# Patient Record
Sex: Male | Born: 1945 | Race: White | Hispanic: No | Marital: Married | Smoking: Current every day smoker
Health system: Southern US, Community
[De-identification: ages and names within clinical notes are randomized; demographics above are authoritative.]

## PROBLEM LIST (undated history)

## (undated) DIAGNOSIS — Z85828 Personal history of other malignant neoplasm of skin: Secondary | ICD-10-CM

## (undated) DIAGNOSIS — E785 Hyperlipidemia, unspecified: Secondary | ICD-10-CM

## (undated) DIAGNOSIS — Z789 Other specified health status: Secondary | ICD-10-CM

## (undated) DIAGNOSIS — I10 Essential (primary) hypertension: Secondary | ICD-10-CM

## (undated) DIAGNOSIS — E559 Vitamin D deficiency, unspecified: Secondary | ICD-10-CM

## (undated) DIAGNOSIS — Z8619 Personal history of other infectious and parasitic diseases: Secondary | ICD-10-CM

## (undated) DIAGNOSIS — E119 Type 2 diabetes mellitus without complications: Secondary | ICD-10-CM

## (undated) DIAGNOSIS — K219 Gastro-esophageal reflux disease without esophagitis: Secondary | ICD-10-CM

## (undated) DIAGNOSIS — I2510 Atherosclerotic heart disease of native coronary artery without angina pectoris: Secondary | ICD-10-CM

## (undated) HISTORY — DX: Personal history of other infectious and parasitic diseases: Z86.19

## (undated) HISTORY — DX: Essential (primary) hypertension: I10

## (undated) HISTORY — PX: HERNIA REPAIR: SHX51

## (undated) HISTORY — DX: Hyperlipidemia, unspecified: E78.5

## (undated) HISTORY — DX: Vitamin D deficiency, unspecified: E55.9

## (undated) HISTORY — PX: TONSILLECTOMY AND ADENOIDECTOMY: SUR1326

## (undated) HISTORY — DX: Gastro-esophageal reflux disease without esophagitis: K21.9

## (undated) HISTORY — DX: Other specified health status: Z78.9

## (undated) HISTORY — DX: Personal history of other malignant neoplasm of skin: Z85.828

## (undated) HISTORY — PX: UMBILICAL HERNIA REPAIR: SHX196

## (undated) HISTORY — PX: CORONARY ANGIOPLASTY WITH STENT PLACEMENT: SHX49

---

## 2003-10-14 ENCOUNTER — Ambulatory Visit (HOSPITAL_COMMUNITY): Admission: RE | Admit: 2003-10-14 | Discharge: 2003-10-14 | Payer: Self-pay | Admitting: Family Medicine

## 2003-10-29 ENCOUNTER — Ambulatory Visit (HOSPITAL_COMMUNITY): Admission: RE | Admit: 2003-10-29 | Discharge: 2003-10-29 | Payer: Self-pay | Admitting: Family Medicine

## 2004-07-05 ENCOUNTER — Ambulatory Visit: Payer: Self-pay | Admitting: Family Medicine

## 2004-09-03 ENCOUNTER — Ambulatory Visit: Payer: Self-pay | Admitting: Family Medicine

## 2004-11-11 ENCOUNTER — Ambulatory Visit: Payer: Self-pay | Admitting: Family Medicine

## 2005-01-24 ENCOUNTER — Ambulatory Visit: Payer: Self-pay | Admitting: Family Medicine

## 2005-04-19 ENCOUNTER — Ambulatory Visit: Payer: Self-pay | Admitting: Family Medicine

## 2005-07-14 ENCOUNTER — Ambulatory Visit: Payer: Self-pay | Admitting: Family Medicine

## 2005-07-20 ENCOUNTER — Ambulatory Visit (HOSPITAL_COMMUNITY): Admission: RE | Admit: 2005-07-20 | Discharge: 2005-07-20 | Payer: Self-pay | Admitting: Family Medicine

## 2005-10-27 ENCOUNTER — Ambulatory Visit: Payer: Self-pay | Admitting: Family Medicine

## 2005-11-14 ENCOUNTER — Ambulatory Visit (HOSPITAL_COMMUNITY): Admission: RE | Admit: 2005-11-14 | Discharge: 2005-11-14 | Payer: Self-pay | Admitting: Family Medicine

## 2005-11-24 ENCOUNTER — Ambulatory Visit: Payer: Self-pay | Admitting: Family Medicine

## 2006-04-27 ENCOUNTER — Ambulatory Visit: Payer: Self-pay | Admitting: Family Medicine

## 2006-07-19 ENCOUNTER — Ambulatory Visit: Payer: Self-pay | Admitting: Family Medicine

## 2006-12-27 ENCOUNTER — Ambulatory Visit: Payer: Self-pay | Admitting: Family Medicine

## 2007-07-31 ENCOUNTER — Ambulatory Visit (HOSPITAL_COMMUNITY): Admission: RE | Admit: 2007-07-31 | Discharge: 2007-07-31 | Payer: Self-pay | Admitting: Family Medicine

## 2010-09-05 ENCOUNTER — Encounter: Payer: Self-pay | Admitting: Family Medicine

## 2013-09-27 ENCOUNTER — Encounter (HOSPITAL_COMMUNITY): Payer: Self-pay | Admitting: Emergency Medicine

## 2013-09-27 ENCOUNTER — Inpatient Hospital Stay (HOSPITAL_COMMUNITY)
Admission: EM | Admit: 2013-09-27 | Discharge: 2013-10-06 | DRG: 963 | Disposition: A | Discharge status: Home Health | Payer: Medicare Other | Attending: General Surgery | Admitting: General Surgery

## 2013-09-27 ENCOUNTER — Emergency Department (HOSPITAL_COMMUNITY): Payer: Medicare Other

## 2013-09-27 DIAGNOSIS — S2249XA Multiple fractures of ribs, unspecified side, initial encounter for closed fracture: Secondary | ICD-10-CM | POA: Diagnosis present

## 2013-09-27 DIAGNOSIS — S2242XA Multiple fractures of ribs, left side, initial encounter for closed fracture: Secondary | ICD-10-CM

## 2013-09-27 DIAGNOSIS — Z79899 Other long term (current) drug therapy: Secondary | ICD-10-CM

## 2013-09-27 DIAGNOSIS — R69 Illness, unspecified: Secondary | ICD-10-CM | POA: Diagnosis present

## 2013-09-27 DIAGNOSIS — S2691XA Contusion of heart, unspecified with or without hemopericardium, initial encounter: Secondary | ICD-10-CM | POA: Diagnosis present

## 2013-09-27 DIAGNOSIS — W19XXXA Unspecified fall, initial encounter: Secondary | ICD-10-CM | POA: Diagnosis present

## 2013-09-27 DIAGNOSIS — N179 Acute kidney failure, unspecified: Secondary | ICD-10-CM | POA: Diagnosis not present

## 2013-09-27 DIAGNOSIS — J9600 Acute respiratory failure, unspecified whether with hypoxia or hypercapnia: Secondary | ICD-10-CM | POA: Diagnosis not present

## 2013-09-27 DIAGNOSIS — S0101XA Laceration without foreign body of scalp, initial encounter: Secondary | ICD-10-CM | POA: Diagnosis present

## 2013-09-27 DIAGNOSIS — Y92009 Unspecified place in unspecified non-institutional (private) residence as the place of occurrence of the external cause: Secondary | ICD-10-CM

## 2013-09-27 DIAGNOSIS — R05 Cough: Secondary | ICD-10-CM | POA: Diagnosis not present

## 2013-09-27 DIAGNOSIS — E876 Hypokalemia: Secondary | ICD-10-CM | POA: Diagnosis not present

## 2013-09-27 DIAGNOSIS — I2510 Atherosclerotic heart disease of native coronary artery without angina pectoris: Secondary | ICD-10-CM | POA: Diagnosis present

## 2013-09-27 DIAGNOSIS — J189 Pneumonia, unspecified organism: Secondary | ICD-10-CM | POA: Diagnosis present

## 2013-09-27 DIAGNOSIS — J9819 Other pulmonary collapse: Secondary | ICD-10-CM | POA: Diagnosis present

## 2013-09-27 DIAGNOSIS — S22079A Unspecified fracture of T9-T10 vertebra, initial encounter for closed fracture: Secondary | ICD-10-CM | POA: Diagnosis present

## 2013-09-27 DIAGNOSIS — S22009A Unspecified fracture of unspecified thoracic vertebra, initial encounter for closed fracture: Secondary | ICD-10-CM | POA: Diagnosis present

## 2013-09-27 DIAGNOSIS — T07XXXA Unspecified multiple injuries, initial encounter: Secondary | ICD-10-CM | POA: Diagnosis present

## 2013-09-27 DIAGNOSIS — R404 Transient alteration of awareness: Secondary | ICD-10-CM | POA: Diagnosis not present

## 2013-09-27 DIAGNOSIS — D62 Acute posthemorrhagic anemia: Secondary | ICD-10-CM | POA: Diagnosis not present

## 2013-09-27 DIAGNOSIS — S0100XA Unspecified open wound of scalp, initial encounter: Secondary | ICD-10-CM | POA: Diagnosis present

## 2013-09-27 DIAGNOSIS — J90 Pleural effusion, not elsewhere classified: Secondary | ICD-10-CM | POA: Diagnosis present

## 2013-09-27 DIAGNOSIS — M5380 Other specified dorsopathies, site unspecified: Secondary | ICD-10-CM | POA: Diagnosis present

## 2013-09-27 DIAGNOSIS — F17200 Nicotine dependence, unspecified, uncomplicated: Secondary | ICD-10-CM | POA: Diagnosis present

## 2013-09-27 DIAGNOSIS — R0989 Other specified symptoms and signs involving the circulatory and respiratory systems: Secondary | ICD-10-CM | POA: Diagnosis present

## 2013-09-27 DIAGNOSIS — W108XXA Fall (on) (from) other stairs and steps, initial encounter: Secondary | ICD-10-CM | POA: Diagnosis present

## 2013-09-27 DIAGNOSIS — I514 Myocarditis, unspecified: Secondary | ICD-10-CM | POA: Diagnosis present

## 2013-09-27 DIAGNOSIS — S32009A Unspecified fracture of unspecified lumbar vertebra, initial encounter for closed fracture: Secondary | ICD-10-CM | POA: Diagnosis present

## 2013-09-27 DIAGNOSIS — Z9861 Coronary angioplasty status: Secondary | ICD-10-CM

## 2013-09-27 DIAGNOSIS — R0609 Other forms of dyspnea: Secondary | ICD-10-CM | POA: Diagnosis present

## 2013-09-27 DIAGNOSIS — F1010 Alcohol abuse, uncomplicated: Secondary | ICD-10-CM | POA: Diagnosis present

## 2013-09-27 DIAGNOSIS — J9383 Other pneumothorax: Secondary | ICD-10-CM | POA: Diagnosis present

## 2013-09-27 DIAGNOSIS — R000 Tachycardia, unspecified: Secondary | ICD-10-CM | POA: Diagnosis not present

## 2013-09-27 DIAGNOSIS — I10 Essential (primary) hypertension: Secondary | ICD-10-CM | POA: Diagnosis present

## 2013-09-27 DIAGNOSIS — IMO0002 Reserved for concepts with insufficient information to code with codable children: Secondary | ICD-10-CM | POA: Diagnosis present

## 2013-09-27 DIAGNOSIS — R059 Cough, unspecified: Secondary | ICD-10-CM | POA: Diagnosis not present

## 2013-09-27 DIAGNOSIS — A419 Sepsis, unspecified organism: Secondary | ICD-10-CM | POA: Diagnosis not present

## 2013-09-27 DIAGNOSIS — S3600XA Unspecified injury of spleen, initial encounter: Secondary | ICD-10-CM | POA: Diagnosis present

## 2013-09-27 HISTORY — DX: Atherosclerotic heart disease of native coronary artery without angina pectoris: I25.10

## 2013-09-27 LAB — POCT I-STAT, CHEM 8
BUN: 12 mg/dL (ref 6–23)
Calcium, Ion: 0.98 mmol/L — ABNORMAL LOW (ref 1.13–1.30)
Chloride: 103 mEq/L (ref 96–112)
Creatinine, Ser: 1.2 mg/dL (ref 0.50–1.35)
Glucose, Bld: 161 mg/dL — ABNORMAL HIGH (ref 70–99)
HCT: 43 % (ref 39.0–52.0)
Hemoglobin: 14.6 g/dL (ref 13.0–17.0)
Potassium: 3.5 mEq/L — ABNORMAL LOW (ref 3.7–5.3)
Sodium: 144 mEq/L (ref 137–147)
TCO2: 23 mmol/L (ref 0–100)

## 2013-09-27 LAB — CG4 I-STAT (LACTIC ACID): Lactic Acid, Venous: 5.86 mmol/L — ABNORMAL HIGH (ref 0.5–2.2)

## 2013-09-27 MED ORDER — FENTANYL CITRATE 0.05 MG/ML IJ SOLN
INTRAMUSCULAR | Status: AC
  Administered 2013-09-27: 50 ug
  Filled 2013-09-27: qty 2

## 2013-09-27 MED ORDER — TETANUS-DIPHTH-ACELL PERTUSSIS 5-2.5-18.5 LF-MCG/0.5 IM SUSP
0.5000 mL | Freq: Once | INTRAMUSCULAR | Status: AC
  Administered 2013-09-27: 0.5 mL via INTRAMUSCULAR
  Filled 2013-09-27: qty 0.5

## 2013-09-27 MED ORDER — SODIUM CHLORIDE 0.9 % IV SOLN
Freq: Once | INTRAVENOUS | Status: AC
  Administered 2013-09-27: 150 mL/h via INTRAVENOUS

## 2013-09-27 NOTE — ED Notes (Addendum)
EMS reports on their arrival a BP 74/54 HR 80. EMS gave 100 ml enroute. Family reports that Pt was recently [facility] and was sober for 6 weeks until tonight. States that pt would not allow her to call EMS for over an hr. States that she found blood in the house throughout the basement, kitchen and up into the bedroom. States that when she attempted to call EMS pt was throwing household items at her. EMS reports that pts wife was very emotional upon arrival.

## 2013-09-27 NOTE — ED Notes (Signed)
Pt 100% on RA. Pt taken to CT.

## 2013-09-28 ENCOUNTER — Emergency Department (HOSPITAL_COMMUNITY): Payer: Medicare Other

## 2013-09-28 ENCOUNTER — Observation Stay (HOSPITAL_COMMUNITY): Payer: Medicare Other

## 2013-09-28 DIAGNOSIS — I2510 Atherosclerotic heart disease of native coronary artery without angina pectoris: Secondary | ICD-10-CM

## 2013-09-28 DIAGNOSIS — S2249XA Multiple fractures of ribs, unspecified side, initial encounter for closed fracture: Secondary | ICD-10-CM

## 2013-09-28 DIAGNOSIS — S0190XA Unspecified open wound of unspecified part of head, initial encounter: Secondary | ICD-10-CM

## 2013-09-28 DIAGNOSIS — W19XXXA Unspecified fall, initial encounter: Secondary | ICD-10-CM | POA: Diagnosis present

## 2013-09-28 DIAGNOSIS — I10 Essential (primary) hypertension: Secondary | ICD-10-CM

## 2013-09-28 DIAGNOSIS — S22009A Unspecified fracture of unspecified thoracic vertebra, initial encounter for closed fracture: Secondary | ICD-10-CM

## 2013-09-28 LAB — URINALYSIS, ROUTINE W REFLEX MICROSCOPIC
Bilirubin Urine: NEGATIVE
Glucose, UA: 100 mg/dL — AB
Hgb urine dipstick: NEGATIVE
Ketones, ur: 15 mg/dL — AB
Leukocytes, UA: NEGATIVE
Nitrite: NEGATIVE
Protein, ur: NEGATIVE mg/dL
Specific Gravity, Urine: 1.019 (ref 1.005–1.030)
Urobilinogen, UA: 0.2 mg/dL (ref 0.0–1.0)
pH: 5 (ref 5.0–8.0)

## 2013-09-28 LAB — CBC WITH DIFFERENTIAL/PLATELET
Basophils Absolute: 0.1 10*3/uL (ref 0.0–0.1)
Basophils Relative: 0 % (ref 0–1)
Eosinophils Absolute: 0 10*3/uL (ref 0.0–0.7)
Eosinophils Relative: 0 % (ref 0–5)
HCT: 39.5 % (ref 39.0–52.0)
Hemoglobin: 13.7 g/dL (ref 13.0–17.0)
Lymphocytes Relative: 8 % — ABNORMAL LOW (ref 12–46)
Lymphs Abs: 1.9 10*3/uL (ref 0.7–4.0)
MCH: 32.9 pg (ref 26.0–34.0)
MCHC: 34.7 g/dL (ref 30.0–36.0)
MCV: 95 fL (ref 78.0–100.0)
Monocytes Absolute: 2.6 10*3/uL — ABNORMAL HIGH (ref 0.1–1.0)
Monocytes Relative: 11 % (ref 3–12)
Neutro Abs: 20.2 10*3/uL — ABNORMAL HIGH (ref 1.7–7.7)
Neutrophils Relative %: 81 % — ABNORMAL HIGH (ref 43–77)
Platelets: 206 10*3/uL (ref 150–400)
RBC: 4.16 MIL/uL — ABNORMAL LOW (ref 4.22–5.81)
RDW: 12.8 % (ref 11.5–15.5)
WBC: 24.8 10*3/uL — ABNORMAL HIGH (ref 4.0–10.5)

## 2013-09-28 LAB — BASIC METABOLIC PANEL
BUN: 13 mg/dL (ref 6–23)
CO2: 19 mEq/L (ref 19–32)
Calcium: 8.1 mg/dL — ABNORMAL LOW (ref 8.4–10.5)
Chloride: 100 mEq/L (ref 96–112)
Creatinine, Ser: 0.9 mg/dL (ref 0.50–1.35)
GFR calc Af Amer: >90 mL/min (ref >90)
GFR calc non Af Amer: 87 mL/min — ABNORMAL LOW (ref >90)
Glucose, Bld: 159 mg/dL — ABNORMAL HIGH (ref 70–99)
Potassium: 3.7 mEq/L (ref 3.7–5.3)
Sodium: 140 mEq/L (ref 137–147)

## 2013-09-28 LAB — APTT: aPTT: 28 seconds (ref 24–37)

## 2013-09-28 LAB — TROPONIN I: Troponin I: <0.3 ng/mL (ref <0.30)

## 2013-09-28 LAB — PROTIME-INR
INR: 1.02 (ref 0.00–1.49)
Prothrombin Time: 13.2 seconds (ref 11.6–15.2)

## 2013-09-28 LAB — MRSA PCR SCREENING: MRSA by PCR: NEGATIVE

## 2013-09-28 LAB — ETHANOL: Alcohol, Ethyl (B): 218 mg/dL — ABNORMAL HIGH (ref 0–11)

## 2013-09-28 MED ORDER — DIPHENHYDRAMINE HCL 12.5 MG/5ML PO ELIX
12.5000 mg | ORAL_SOLUTION | Freq: Four times a day (QID) | ORAL | Status: DC | PRN
  Filled 2013-09-28: qty 5

## 2013-09-28 MED ORDER — IOHEXOL 300 MG/ML  SOLN
100.0000 mL | Freq: Once | INTRAMUSCULAR | Status: AC | PRN
  Administered 2013-09-28: 100 mL via INTRAVENOUS

## 2013-09-28 MED ORDER — DIPHENHYDRAMINE HCL 50 MG/ML IJ SOLN
12.5000 mg | Freq: Four times a day (QID) | INTRAMUSCULAR | Status: DC | PRN
Start: 2013-09-28 — End: 2013-09-28

## 2013-09-28 MED ORDER — HYDROMORPHONE 0.3 MG/ML IV SOLN
INTRAVENOUS | Status: DC
  Administered 2013-09-28: 15:00:00 via INTRAVENOUS
  Administered 2013-09-28: 2.39 mg via INTRAVENOUS
  Administered 2013-09-29: 0.6 mg via INTRAVENOUS
  Administered 2013-09-29: 1.59 mg via INTRAVENOUS
  Administered 2013-09-29: 1.19 mg via INTRAVENOUS
  Administered 2013-09-29: 1.39 mg via INTRAVENOUS
  Administered 2013-09-29: 0.6 mg via INTRAVENOUS
  Administered 2013-09-29: 07:00:00 via INTRAVENOUS
  Filled 2013-09-28 (×2): qty 25

## 2013-09-28 MED ORDER — KETOROLAC TROMETHAMINE 30 MG/ML IJ SOLN
15.0000 mg | Freq: Once | INTRAMUSCULAR | Status: AC
  Administered 2013-09-28: 15 mg via INTRAVENOUS

## 2013-09-28 MED ORDER — DEXTROSE-NACL 5-0.9 % IV SOLN
INTRAVENOUS | Status: DC
  Administered 2013-09-28 (×2): 75 mL/h via INTRAVENOUS
  Administered 2013-09-29: 07:00:00 via INTRAVENOUS

## 2013-09-28 MED ORDER — SODIUM CHLORIDE 0.9 % IJ SOLN: 9.0000 mL | INTRAMUSCULAR | Status: DC | PRN

## 2013-09-28 MED ORDER — HYDROCHLOROTHIAZIDE 25 MG PO TABS
25.0000 mg | ORAL_TABLET | Freq: Every day | ORAL | Status: DC
  Administered 2013-09-28 – 2013-09-29 (×2): 25 mg via ORAL
  Filled 2013-09-28 (×3): qty 1

## 2013-09-28 MED ORDER — ONDANSETRON HCL 4 MG/2ML IJ SOLN: 4.0000 mg | Freq: Four times a day (QID) | INTRAMUSCULAR | Status: DC | PRN

## 2013-09-28 MED ORDER — KETOROLAC TROMETHAMINE 30 MG/ML IJ SOLN
30.0000 mg | Freq: Once | INTRAMUSCULAR | Status: DC
  Filled 2013-09-28: qty 1

## 2013-09-28 MED ORDER — NALOXONE HCL 0.4 MG/ML IJ SOLN
0.4000 mg | INTRAMUSCULAR | Status: DC | PRN
  Filled 2013-09-28: qty 1

## 2013-09-28 MED ORDER — ENOXAPARIN SODIUM 40 MG/0.4ML ~~LOC~~ SOLN
40.0000 mg | SUBCUTANEOUS | Status: DC
  Administered 2013-09-28 – 2013-09-29 (×2): 40 mg via SUBCUTANEOUS
  Filled 2013-09-28 (×4): qty 0.4

## 2013-09-28 MED ORDER — LISINOPRIL 10 MG PO TABS
10.0000 mg | ORAL_TABLET | Freq: Every day | ORAL | Status: DC
  Administered 2013-09-28 – 2013-09-29 (×2): 10 mg via ORAL
  Filled 2013-09-28 (×3): qty 1

## 2013-09-28 MED ORDER — POTASSIUM CHLORIDE CRYS ER 20 MEQ PO TBCR
20.0000 meq | EXTENDED_RELEASE_TABLET | Freq: Two times a day (BID) | ORAL | Status: DC
  Administered 2013-09-28: 20 meq via ORAL
  Filled 2013-09-28 (×3): qty 1

## 2013-09-28 MED ORDER — BIOTENE DRY MOUTH MT LIQD
15.0000 mL | Freq: Two times a day (BID) | OROMUCOSAL | Status: DC
  Administered 2013-09-28 – 2013-09-29 (×4): 15 mL via OROMUCOSAL

## 2013-09-28 MED ORDER — ONDANSETRON HCL 4 MG/2ML IJ SOLN
4.0000 mg | Freq: Once | INTRAMUSCULAR | Status: AC
  Administered 2013-09-28: 4 mg via INTRAVENOUS
  Filled 2013-09-28: qty 2

## 2013-09-28 MED ORDER — THIAMINE HCL 100 MG/ML IJ SOLN
100.0000 mg | Freq: Every day | INTRAMUSCULAR | Status: DC
  Administered 2013-09-28 – 2013-10-04 (×8): 100 mg via INTRAVENOUS
  Filled 2013-09-28 (×7): qty 1

## 2013-09-28 MED ORDER — SODIUM CHLORIDE 0.9 % IV BOLUS (SEPSIS)
1000.0000 mL | Freq: Once | INTRAVENOUS | Status: AC
  Administered 2013-09-28: 1000 mL via INTRAVENOUS

## 2013-09-28 MED ORDER — METOPROLOL TARTRATE 1 MG/ML IV SOLN: 5.0000 mg | Freq: Four times a day (QID) | INTRAVENOUS | Status: DC | PRN

## 2013-09-28 MED ORDER — BACITRACIN-NEOMYCIN-POLYMYXIN OINTMENT TUBE
1.0000 "application " | TOPICAL_OINTMENT | Freq: Two times a day (BID) | CUTANEOUS | Status: DC
  Administered 2013-09-28 – 2013-10-06 (×17): 1 via TOPICAL
  Filled 2013-09-28 (×2): qty 15

## 2013-09-28 MED ORDER — HYDROMORPHONE HCL PF 1 MG/ML IJ SOLN
1.0000 mg | Freq: Once | INTRAMUSCULAR | Status: AC
  Administered 2013-09-28: 1 mg via INTRAVENOUS
  Filled 2013-09-28: qty 1

## 2013-09-28 MED ORDER — FENTANYL 10 MCG/ML IV SOLN
INTRAVENOUS | Status: DC
  Administered 2013-09-28: 11:00:00 via INTRAVENOUS
  Administered 2013-09-28: 285 ug via INTRAVENOUS
  Administered 2013-09-28: 270 ug via INTRAVENOUS
  Administered 2013-09-28: 04:00:00 via INTRAVENOUS
  Filled 2013-09-28 (×2): qty 50

## 2013-09-28 MED ORDER — DIPHENHYDRAMINE HCL 50 MG/ML IJ SOLN: 12.5000 mg | Freq: Four times a day (QID) | INTRAMUSCULAR | Status: DC | PRN

## 2013-09-28 MED ORDER — DIAZEPAM 5 MG/ML IJ SOLN
2.5000 mg | Freq: Once | INTRAMUSCULAR | Status: AC
  Administered 2013-09-28: 2.5 mg via INTRAVENOUS

## 2013-09-28 MED ORDER — NALOXONE HCL 0.4 MG/ML IJ SOLN: 0.4000 mg | INTRAMUSCULAR | Status: DC | PRN

## 2013-09-28 MED ORDER — DIAZEPAM 5 MG/ML IJ SOLN
INTRAMUSCULAR | Status: AC
  Filled 2013-09-28: qty 2

## 2013-09-28 NOTE — Progress Notes (Signed)
3mL waste from fentanyl PCA syringe with syringe change. Herbert DeanerPuja Patel RN witness waste in sink. Koren BoundWaggoner, Taila Basinski

## 2013-09-28 NOTE — Progress Notes (Signed)
NIF -60, VC 600-700, per RN IS= 600

## 2013-09-28 NOTE — Progress Notes (Addendum)
E Riebock NP called. NP updated RN stating ok to turn pt in bed and can get OOB with TLSO brace in place per neurosurgery, no OOB until brace in place, ok to turn, elevate HOB, can d/c order for laying flat only and log roll. NP ok to advance pt diet. Will leave c-collar in place for now until further xray. NP ok to give lovenox now. Will continue to monitor. Koren BoundWaggoner, Chalmer Zheng

## 2013-09-28 NOTE — ED Notes (Signed)
Pt in CT.

## 2013-09-28 NOTE — Progress Notes (Signed)
UR Completed.  Lochlan Grygiel Jane [phone] 09/28/2013  

## 2013-09-28 NOTE — Progress Notes (Signed)
I saw the patient, participated in the history, exam and medical decision making, and concur with the physician assistant's note above.  Asleep, says pain ok ox3 cta with a little d/c BS at base Soft, nt  -hypokalemia - replace potassium -rib fx - aggressive pulm toilet, pain control -HTN - restart home BP meds  Mary SellaEric M. Andrey CampanileWilson, MD, FACS General, Bariatric, & Minimally Invasive Surgery [facility] [place]

## 2013-09-28 NOTE — Progress Notes (Signed)
Dr Andrey CampanileWilson updated pt status. Updated pt ordered lovenox this AM. Pt with neurosurgery consult, pending, MD yet to round at this point. MD stated ok to hold lovenox until rounds from neurosurgery. Will continue to monitor. Koren BoundWaggoner, Lachelle Rissler

## 2013-09-28 NOTE — Progress Notes (Signed)
Patient ID: Philip Ford, male   DOB: 1946-05-07, 68 y.o.   MRN: [id number]  LOS: 1 day   Subjective: C/o mostly left sided rib pain.  No sob.  Denies weakness, paresthesias.    Objective: Vital signs in last 24 hours: Temp:  [97.8 F (36.6 C)-100 F (37.8 C)] 98.2 F (36.8 C) (02/14 1202) Pulse Rate:  [92-108] 96 (02/14 1300) Resp:  [14-27] 19 (02/14 1300) BP: (112-174)/(60-142) 159/71 mmHg (02/14 1300) SpO2:  [2 %-100 %] 95 % (02/14 1300) FiO2 (%):  [100 %] 100 % (02/14 0109) Weight:  [90.719 kg (200 lb)] 90.719 kg (200 lb) (02/14 0342) Last BM Date: 09/27/13  Lab Results:  CBC  Recent Labs  09/27/13 2340 09/27/13 2357  WBC 24.8*  --   HGB 13.7 14.6  HCT 39.5 43.0  PLT 206  --    BMET  Recent Labs  09/27/13 2340 09/27/13 2357  NA 140 144  K 3.7 3.5*  CL 100 103  CO2 19  --   GLUCOSE 159* 161*  BUN 13 12  CREATININE 0.90 1.20  CALCIUM 8.1*  --     Imaging: Dg Ribs Unilateral Left  09/28/2013   CLINICAL DATA:  Left rib pain, fell down stairs  EXAM: LEFT RIBS - 2 VIEW  COMPARISON:  CT scan of the abdomen and pelvis performed earlier today  FINDINGS: Multiple left-sided rib fractures including ribs 3-11. No pneumothorax. Low inspiratory volumes with mild left basilar atelectasis.  IMPRESSION: Numerous left-sided rib fractures including ribs 3, 4, 5, 6, 7, 8, 9, 10 and 11. Many of the fractures are segmental.   Electronically Signed   By: Malachy Moan M.D.   On: 09/28/2013 01:25   Ct Head Wo Contrast  09/28/2013   CLINICAL DATA:  Fall  EXAM: CT HEAD WITHOUT CONTRAST  CT CERVICAL SPINE WITHOUT CONTRAST  TECHNIQUE: Multidetector CT imaging of the head and cervical spine was performed following the standard protocol without intravenous contrast. Multiplanar CT image reconstructions of the cervical spine were also generated.  COMPARISON:  None.  FINDINGS: CT HEAD FINDINGS  Focal area of Encephalomalacia within the left frontal lobe is compatible with remote infarct.  Additional scattered hypo intense foci within the periventricular and deep white matter compatible with mild chronic microvascular ischemic changes. There is no acute intracranial hemorrhage or infarct. No mass lesion or midline shift. Gray-white matter differentiation is well maintained. Ventricles are normal in size without evidence of hydrocephalus. CSF containing spaces are within normal limits. No extra-axial fluid collection.  The calvarium is intact.  Orbital soft tissues are within normal limits.  Mild polypoid opacity present within the right maxillary sinus. There is mild mucoperiosteal thickening within the ethmoidal air cells and frontal sinuses. No mastoid effusion.  Left parietal scalp contusion present with laceration. No radiopaque foreign body.  CT CERVICAL SPINE FINDINGS  There is straightening of the normal cervical lordosis. Trace retrolisthesis of C5 on C6 is present. Vertebral body heights are preserved. Normal C1-2 articulations are intact. No prevertebral soft tissue swelling.  No acute fracture or listhesis.  Moderate degenerative disc disease as evidenced by intervertebral disc space narrowing and endplate osteophytosis is present at C5-6. Additional mild multilevel degenerative disc disease seen throughout the cervical spine.  Visualized soft tissues of the neck are within normal limits. Atherosclerotic calcifications noted about the carotid bifurcations. Scattered atherosclerotic plaque also noted within the proximal right vertebral artery.  IMPRESSION: CT BRAIN:  1. Left parietal scalp contusion/laceration. No acute  intracranial process. 2. Remote left frontal lobe infarct. 3. Mild chronic microvascular ischemic changes.  CT CERVICAL SPINE:  1. No acute traumatic injury within the cervical spine. 2. Moderate degenerative disc disease as above, most severe at C5-6.   Electronically Signed   By: Rise Mu M.D.   On: 09/28/2013 00:39   Ct Cervical Spine Wo Contrast  09/28/2013    CLINICAL DATA:  Fall  EXAM: CT HEAD WITHOUT CONTRAST  CT CERVICAL SPINE WITHOUT CONTRAST  TECHNIQUE: Multidetector CT imaging of the head and cervical spine was performed following the standard protocol without intravenous contrast. Multiplanar CT image reconstructions of the cervical spine were also generated.  COMPARISON:  None.  FINDINGS: CT HEAD FINDINGS  Focal area of Encephalomalacia within the left frontal lobe is compatible with remote infarct. Additional scattered hypo intense foci within the periventricular and deep white matter compatible with mild chronic microvascular ischemic changes. There is no acute intracranial hemorrhage or infarct. No mass lesion or midline shift. Gray-white matter differentiation is well maintained. Ventricles are normal in size without evidence of hydrocephalus. CSF containing spaces are within normal limits. No extra-axial fluid collection.  The calvarium is intact.  Orbital soft tissues are within normal limits.  Mild polypoid opacity present within the right maxillary sinus. There is mild mucoperiosteal thickening within the ethmoidal air cells and frontal sinuses. No mastoid effusion.  Left parietal scalp contusion present with laceration. No radiopaque foreign body.  CT CERVICAL SPINE FINDINGS  There is straightening of the normal cervical lordosis. Trace retrolisthesis of C5 on C6 is present. Vertebral body heights are preserved. Normal C1-2 articulations are intact. No prevertebral soft tissue swelling.  No acute fracture or listhesis.  Moderate degenerative disc disease as evidenced by intervertebral disc space narrowing and endplate osteophytosis is present at C5-6. Additional mild multilevel degenerative disc disease seen throughout the cervical spine.  Visualized soft tissues of the neck are within normal limits. Atherosclerotic calcifications noted about the carotid bifurcations. Scattered atherosclerotic plaque also noted within the proximal right vertebral  artery.  IMPRESSION: CT BRAIN:  1. Left parietal scalp contusion/laceration. No acute intracranial process. 2. Remote left frontal lobe infarct. 3. Mild chronic microvascular ischemic changes.  CT CERVICAL SPINE:  1. No acute traumatic injury within the cervical spine. 2. Moderate degenerative disc disease as above, most severe at C5-6.   Electronically Signed   By: Rise Mu M.D.   On: 09/28/2013 00:39   Ct Thoracic Spine Wo Contrast  09/28/2013   CLINICAL DATA:  Fall.  Left rib pain.  Evaluate for spinal fracture.  EXAM: CT THORACIC SPINE WITHOUT CONTRAST  TECHNIQUE: Multidetector CT imaging of the thoracic spine was performed without intravenous contrast administration. Multiplanar CT image reconstructions were also generated.  COMPARISON:  Rib radiographs 09/28/2013. CT abdomen and pelvis 09/28/2013.  FINDINGS: Thoracic vertebral alignment is normal. Multilevel bridging osteophytosis is present. Vertebral body heights are preserved without evidence of compression fracture. There is a nondisplaced fracture involving a right-sided osteophyte at T9 which also involves the right lateral and anterior portions of the vertebral body without height loss or definite comminution. The fracture does not clearly extend into the posterior half of the vertebral body, and no involvement of the posterior elements is identified. There are nondisplaced left T7 through L2 transverse process fractures. There are fractures through the posterior left fourth, fifth, ninth, and tenth ribs. Additional rib fractures described on recent abdomen pelvis CT were not included on this study.  Dependent subsegmental atelectasis is  present within the visualized portions of both lungs. Excreted IV contrast is partially visualized in the renal collecting systems. 9 mm left upper pole renal hypodensity is too small to fully characterize but most likely represents a cyst. Moderate aortic atherosclerosis is partially visualized.   IMPRESSION: 1. Nondisplaced fracture involving a right-sided osteophyte at T9 with extension into the right lateral and anterior portions of the T9 vertebral body. No height loss or involvement of the middle or posterior columns. 2. Fractures of the left T7 through L2 transverse processes and multiple left-sided ribs.   Electronically Signed   By: Sebastian AcheAllen  Grady   On: 09/28/2013 04:13   Ct Abdomen Pelvis W Contrast  09/28/2013   ADDENDUM REPORT: 09/28/2013 01:25  ADDENDUM: Nondisplaced fracture through the posterior aspect of the left eleventh rib as well.   Electronically Signed   By: Malachy MoanHeath  McCullough M.D.   On: 09/28/2013 01:25   09/28/2013   CLINICAL DATA:  Fall, left rib pain  EXAM: CT ABDOMEN AND PELVIS WITH CONTRAST  TECHNIQUE: Multidetector CT imaging of the abdomen and pelvis was performed using the standard protocol following bolus administration of intravenous contrast.  CONTRAST:  100mL OMNIPAQUE IOHEXOL 300 MG/ML  SOLN  COMPARISON:  None.  FINDINGS: Lower Chest: Extensive patient and respiratory motion limits evaluation for small pulmonary nodules. Dependent atelectasis is noted in the bilateral lower lobes. The heart is within normal limits for size. No pericardial effusion. Coronary artery calcifications are present. Small left pleural effusion.  Abdomen: Unremarkable CT appearance of the stomach, duodenum, spleen, adrenal glands, and pancreas. Punctate calcification in the right hepatic lobe suggest old granulomatous disease. No focal hepatic lesion or abnormality. Gallbladder is unremarkable. No intra or extrahepatic biliary ductal dilatation. Calcified porta hepatis node.  Unremarkable appearance of the bilateral kidneys. No focal solid lesion, hydronephrosis or nephrolithiasis. Subcentimeter low-attenuation lesions bilaterally are too small to characterize but statistically highly likely benign cysts.  No evidence of obstruction or focal bowel wall thickening. Normal appendix in the right lower  quadrant. The terminal ileum is unremarkable. No free fluid or suspicious adenopathy.  Pelvis: The bladder is distended with urine. Otherwise, unremarkable male pelvis. No free fluid or suspicious adenopathy.  Bones/Soft Tissues: Numerous left-sided rib and transverse process fractures. Segmental left tenth rib fracture with nondisplaced fractures laterally and posteriorly rib as well as a nondisplaced fracture through the left transverse process of T10. Segmental ninth rib fracture with nondisplaced fracture laterally and displaced fracture posteriorly. Additionally, the neck of the rib is fractured as a is the left transverse process of T9. Displaced fracture through the posterior aspect of the eighth rib with a nondisplaced fracture of the left transverse process of T8. Segmental left seventh rib fracture with nondisplaced anterior fracture and nondisplaced posterior rib fracture. Nondisplaced fracture through the left transverse process of T7. Segmental left sixth rib fracture. T6 is incompletely imaged. Minimally displaced fracture of the left transverse process of L1. There appears to be nondisplaced fracture through a prominent anterior osteophyte at T9 extending into the T9 vertebral body. No evidence of vertebral body height loss.  Vascular: Atherosclerotic vascular disease without significant stenosis or aneurysmal dilatation.  IMPRESSION: 1. Incompletely imaged multifocal trauma to the left hemi thorax including segmental fractures of the left sixth through tenth ribs, nondisplaced fractures of the left transverse processes of T7 through T10 and a probable nondisplaced fracture through the inferior aspect of the T9 vertebral body without evidence of significant height loss. Consider dedicated chest CT for complete  evaluation. 2. Small left pleural effusion and associated left basilar atelectasis. No definite pneumothorax in the visualized portion of the lung. 3. Nondisplaced fracture through the left  transverse process of L1. 4. No definite acute injury in the abdomen or pelvis. 5. Atherosclerosis including coronary artery disease.  Electronically Signed: By: Malachy Moan M.D. On: 09/28/2013 00:58   Dg Pelvis Portable  09/27/2013   CLINICAL DATA:  Fall.  Left hip pain.  EXAM: PORTABLE PELVIS 1-2 VIEWS  COMPARISON:  None.  FINDINGS: Evaluation is mildly limited by external support equipment/ backboard. No acute fracture is identified. The femoral heads are approximated with the acetabula on this single frontal projection. Hip joint spaces are preserved. No focal lytic or blastic osseous lesion is seen. Degenerative disc disease is noted in the visualized lower lumbar spine.  IMPRESSION: No acute osseous abnormality identified.   Electronically Signed   By: Sebastian Ache   On: 09/27/2013 23:51   Dg Chest Port 1 View  09/27/2013   CLINICAL DATA:  Fall, trauma.  Left lower rib pain  EXAM: PORTABLE CHEST - 1 VIEW  COMPARISON:  Prior chest x-ray 10/29/2003  FINDINGS: Limited single frontal view of the chest with metallic artifact projecting over the image. Stable cardiac and mediastinal contours given differences in portable AP technique. No pneumothorax or pleural effusion. The lungs are clear. At least 2 left-sided rib fractures are identified. There are acute minimally displaced fractures of the antral lateral aspect of left ribs 6 and 7.  IMPRESSION: Acute minimally displaced fractures of the anterolateral aspects of left ribs 6 and 7. No pneumothorax identified.  Of note, left ribs 8 and 9 are incompletely imaged.   Electronically Signed   By: Malachy Moan M.D.   On: 09/27/2013 23:52     PE: General appearance: alert, cooperative and no distress Head: posterior scalp laceration, closed and without active bleeding.   Neck: c collar Resp: clear to auscultation bilaterally Cardio: regular rate and rhythm, S1, S2 normal, no murmur, click, rub or gallop GI: soft, non-tender; bowel sounds  normal; no masses,  no organomegaly Extremities: multiple abrasions to extremities. Neurologic: Grossly normal    Patient Active Problem List   Diagnosis Date Noted  . Fall 09/28/2013  . Trauma 09/28/2013    Assessment/Plan: GLF L 3-11 rib fx- no PTX on imaging, 02 sats are stable.  Repeat CXR in AM. Pain control change to reduced dose PCA.  Will start oral pain meds once he is evaluated by NSU.  He is a 1/2ppd smoker.  He will need aggressive pulmonary toilet.   L TP fx T7-10 T9 vertebral body fx  L1 TP fx  -await neurosurgery recommendations.  For now, bedrest, c collar until we can clear his neck, pain control Posterior head lac-closed by EDP, will need staples out in 6 days. Multiple abrasions-local care Alcohol abuse-CIWA protocol, vitamins VTE - SCD's, Lovenox after NSU eval.  FEN - IVF Dispo -- continue ICU today   Ashok Norris, ANP-BC Pager: [phone]) General Trauma [place] Pager: [phone])   09/28/2013 2:33 PM

## 2013-09-28 NOTE — Progress Notes (Signed)
E Riebock NP updated at bedside. Updated neurosurgery consult pending, no MD to bedside at this point. Holding Lovenox until consult. NP ok with small amount ice chips, otherwise NPO. Will change PCA to dilaudid, pt with complaints of pain to left chest/ribs. Will continue to monitor. Philip Ford, Philip Ford

## 2013-09-28 NOTE — ED Notes (Signed)
Family updated as to patient's status.

## 2013-09-28 NOTE — ED Notes (Signed)
[facility]

## 2013-09-28 NOTE — Progress Notes (Addendum)
INITIAL NUTRITION ASSESSMENT  DOCUMENTATION CODES Per approved criteria  -Not Applicable   INTERVENTION: Diet advancement per team. Pt would benefit from oral nutrition supplements once diet advanced to promote healing, recommend Ensure Complete. If unable to advance diet, recommend initiation of nutrition support to promote healing. If enteral nutrition warranted, recommend initiation of Pivot 1.5 via enteral feeding tube at 25 ml/hr, advance by 10 ml q 4 hours, to goal of 55 ml/hr. Goal rate will provide: 1980 kcal, 124 grams protein, 1002 ml free water. RD to continue to follow nutrition care plan.  NUTRITION DIAGNOSIS: Inadequate oral intake related to inability to eat as evidenced by NPO status.   Goal: Diet advancement as tolerate vs initiation of nutrition support. Intake to meet at least 90% of estimated needs.  Monitor:  weight trends, lab trends, I/O's, diet advancement  Reason for Assessment: Malnutrition Screening Tool  68 y.o. male  Admitting Dx: s/p fall  ASSESSMENT: PMHx significant for CAD and alcoholism. Admitted with alcohol intoxication. Family reports that pt [facility] and was sober for 6 weeks until tonight. Pt was found after sustaining a fall. Pt sustained ICH, several fractures (spine, long bones, ribs, facial), pneumothorax, chest contusion, cardiac contusion, liver injury, splenic injury, multiple contusions, and perforated viscus.  Potassium is presently low at 3.5. Pt in cervical collar. Appears well-nourished with no significant muscle or fat mass loss visible during brief physical exam. Pt states that his weight is usually around 200 lb which is consistent with current weight. Denies poor appetite. Currently NPO. No family at bedside.  Pt is at nutrition risk 2/2 ETOH abuse and polytrauma.  Height: Ht Readings from Last 1 Encounters:  09/28/13 5\' 11"  (1.803 m)    Weight: Wt Readings from  Last 1 Encounters:  09/28/13 200 lb (90.719 kg)    Ideal Body Weight: 172 lb  % Ideal Body Weight: 116%  Wt Readings from Last 10 Encounters:  09/28/13 200 lb (90.719 kg)    Usual Body Weight: 200 lb  % Usual Body Weight: 100%  BMI:  Body mass index is 27.91 kg/(m^2). Overweight  Estimated Nutritional Needs: Kcal: 1800 - 2100 Protein: 115 - 130 g Fluid: 1.8 - 2 liters  Skin: several incisions/lacerations  Diet Order: NPO  EDUCATION NEEDS: -No education needs identified at this time   Intake/Output Summary (Last 24 hours) at 09/28/13 0850 Last data filed at 09/28/13 0700  Gross per 24 hour  Intake   2320 ml  Output    350 ml  Net   1970 ml    Last BM: 2/13  Labs:   Recent Labs Lab 09/27/13 2340 09/27/13 2357  NA 140 144  K 3.7 3.5*  CL 100 103  CO2 19  --   BUN 13 12  CREATININE 0.90 1.20  CALCIUM 8.1*  --   GLUCOSE 159* 161*    CBG (last 3)  No results found for this basename: GLUCAP,  in the last 72 hours  Scheduled Meds: . antiseptic oral rinse  15 mL Mouth Rinse BID  . enoxaparin (LOVENOX) injection  40 mg Subcutaneous Q24H  . fentaNYL   Intravenous 6 times per day  . thiamine  100 mg Intravenous Daily    Continuous Infusions: . dextrose 5 % and 0.9% NaCl 75 mL/hr (09/28/13 0244)    Past Medical History  Diagnosis Date  . Coronary artery disease   . Hypertension     Past Surgical History  Procedure Laterality Date  . Coronary stent placement      Jarold Motto MS, RD, LDN Inpatient Registered Dietitian Pager: [phone] After-hours pager: [phone]

## 2013-09-28 NOTE — ED Provider Notes (Signed)
CSN: [id number]     Arrival date & time 09/27/13  2300 History   First MD Initiated Contact with Patient 09/27/13 2304     Chief Complaint  Patient presents with  . Trauma  . Fall     (Consider location/radiation/quality/duration/timing/severity/associated sxs/prior Treatment) HPI Comments: Pt comes in with cc of fall. Pt has hx of CAD, and admits to alcohol intoxication, he is ao x 3 at arrival. Per EMS report, patient's family found him on the bed at 8 pm, after he had already sustained a fall. Pt refused to come to the ED initially, but finally EMS was called. Pt states he took nitro because of pain to his chest secondary to fall. His BP per EMS was in the 70SBP when they arrived, but is improved upon ED assessment. Pt complains of left sided thoracic chest pain, where there is some deformity. Unknown tetanus time. Pt has a large laceration to the scalp - denies LOC, headaches, DIB.  Patient is a 68 y.o. male presenting with trauma and fall. The history is provided by the patient.  Trauma Mechanism of injury: fall   Current symptoms:      Associated symptoms:            Reports chest pain.            Denies abdominal pain.  Fall Associated symptoms include chest pain. Pertinent negatives include no abdominal pain and no shortness of breath.    Past Medical History  Diagnosis Date  . Coronary artery disease   . Hypertension    Past Surgical History  Procedure Laterality Date  . Coronary stent placement     History reviewed. No pertinent family history. History  Substance Use Topics  . Smoking status: Current Every Day Smoker  . Smokeless tobacco: Not on file  . Alcohol Use: Yes    Review of Systems  Constitutional: Negative for activity change and appetite change.  Respiratory: Negative for cough and shortness of breath.   Cardiovascular: Positive for chest pain.  Gastrointestinal: Negative for abdominal pain.  Genitourinary: Negative for dysuria.  Skin: Positive  for wound.  All other systems reviewed and are negative.      Allergies  Review of patient's allergies indicates no known allergies.  Home Medications  No current outpatient prescriptions on file. BP 139/74  Pulse 95  Temp(Src) 98.3 F (36.8 C) (Oral)  Resp 23  SpO2 100% Physical Exam  Nursing note and vitals reviewed. Constitutional: He is oriented to person, place, and time. He appears well-developed.  HENT:  Left vortex scalp - 9 cm.  Eyes: Conjunctivae and EOM are normal. Pupils are equal, round, and reactive to light.  Neck:  In c-collar  Cardiovascular: Normal rate and regular rhythm.   Pulmonary/Chest: Effort normal and breath sounds normal. He exhibits tenderness.  Mild left sided deformity - at the lower thoracic region.    Abdominal: Soft. Bowel sounds are normal. He exhibits distension. There is tenderness. There is no rebound and no guarding.  LUQ tenderness  Neurological: He is alert and oriented to person, place, and time.  Skin: Skin is warm.    ED Course  LACERATION REPAIR Date/Time: 09/28/2013 2:41 AM Performed by: Derwood Kaplan Authorized by: Derwood Kaplan Consent: Verbal consent obtained. Risks and benefits: risks, benefits and alternatives were discussed Consent given by: patient Patient understanding: patient states understanding of the procedure being performed Required items: required blood products, implants, devices, and special equipment available Patient identity confirmed: verbally with  patient Body area: head/neck Location details: scalp Laceration length: 9 cm Foreign bodies: no foreign bodies Tendon involvement: none Nerve involvement: none Vascular damage: no Anesthesia: local infiltration Local anesthetic: lidocaine 1% with epinephrine Anesthetic total: 5 ml Patient sedated: no Preparation: Patient was prepped and draped in the usual sterile fashion. Irrigation solution: saline Irrigation method: syringe Amount of  cleaning: standard Debridement: minimal Degree of undermining: none Skin closure: 3-0 nylon Number of sutures: 2 Technique: simple Approximation: close Approximation difficulty: simple Dressing: 4x4 sterile gauze Patient tolerance: Patient tolerated the procedure well with no immediate complications. Comments: 13 staples were also placed to the scalp   (including critical care time) Labs Review Labs Reviewed  POCT I-STAT, CHEM 8 - Abnormal; Notable for the following:    Potassium 3.5 (*)    Glucose, Bld 161 (*)    Calcium, Ion 0.98 (*)    All other components within normal limits  CG4 I-STAT (LACTIC ACID) - Abnormal; Notable for the following:    Lactic Acid, Venous 5.86 (*)    All other components within normal limits  CBC WITH DIFFERENTIAL  BASIC METABOLIC PANEL  TROPONIN I  URINALYSIS, ROUTINE W REFLEX MICROSCOPIC  ETHANOL  APTT  PROTIME-INR   Imaging Review Dg Pelvis Portable  09/27/2013   CLINICAL DATA:  Fall.  Left hip pain.  EXAM: PORTABLE PELVIS 1-2 VIEWS  COMPARISON:  None.  FINDINGS: Evaluation is mildly limited by external support equipment/ backboard. No acute fracture is identified. The femoral heads are approximated with the acetabula on this single frontal projection. Hip joint spaces are preserved. No focal lytic or blastic osseous lesion is seen. Degenerative disc disease is noted in the visualized lower lumbar spine.  IMPRESSION: No acute osseous abnormality identified.   Electronically Signed   By: Sebastian Ache   On: 09/27/2013 23:51   Dg Chest Port 1 View  09/27/2013   CLINICAL DATA:  Fall, trauma.  Left lower rib pain  EXAM: PORTABLE CHEST - 1 VIEW  COMPARISON:  Prior chest x-ray 10/29/2003  FINDINGS: Limited single frontal view of the chest with metallic artifact projecting over the image. Stable cardiac and mediastinal contours given differences in portable AP technique. No pneumothorax or pleural effusion. The lungs are clear. At least 2 left-sided rib  fractures are identified. There are acute minimally displaced fractures of the antral lateral aspect of left ribs 6 and 7.  IMPRESSION: Acute minimally displaced fractures of the anterolateral aspects of left ribs 6 and 7. No pneumothorax identified.  Of note, left ribs 8 and 9 are incompletely imaged.   Electronically Signed   By: Malachy Moan M.D.   On: 09/27/2013 23:52    EKG Interpretation    Date/Time:  Friday September 27 2013 23:06:14 EST Ventricular Rate:  97 PR Interval:  144 QRS Duration: 78 QT Interval:  343 QTC Calculation: 436 R Axis:   37 Text Interpretation:  Sinus rhythm Confirmed by Rhunette Croft, MD, Aldan Camey ([id number]) on 09/28/2013 12:07:24 AM            MDM   Final diagnoses:  None    DDx includes: ICH Fractures - spine, long bones, ribs, facial Pneumothorax Chest contusion Traumatic myocarditis/cardiac contusion Liver injury/bleed/laceration Splenic injury/bleed/laceration Perforated viscus Multiple contusions  Pt is intoxicated, comes in with fall, and left sided thoracic chest pain and bleeding from a head lac. Tetanus updated. Bedside US shows no ptx, no LUQ free fluid in the splenorenal recess.  Appropriate imaging ordered.   2:42 AM Pt  has thoracic spine fractures, rib fractures - 6-10. Scalp lac repaired. Trauma consulted -Dr. Derrell Lollingamirez to admit.  Neurosurgery paged - 2nd page just placed now per Dr. Jacinto Halimamirez's request. Pt is moving all 4, gross motor and sensory exams are normal. Will place a foley.    Derwood KaplanAnkit Carah Barrientes, MD 09/28/13 [phone]

## 2013-09-28 NOTE — ED Notes (Addendum)
Back from CT, alert, NAD, calm, interactive, no dyspnea, C-collar remains in place, Dr. Rhunette CroftNanavati into room, speaking with pt. 2nd liter bolus initiated.

## 2013-09-28 NOTE — ED Notes (Signed)
Dr. Rhunette CroftNanavati suturing and stapling head at this time, pt alert, NAD, calm, interactive, VSS, [facility]

## 2013-09-28 NOTE — ED Notes (Signed)
Dr Rhunette CroftNanavati given a copy of lactic acid results 5.86

## 2013-09-28 NOTE — Progress Notes (Signed)
Resp Care Note; FVC .600 L /fvc -80ccH2O,with good pt effort.

## 2013-09-28 NOTE — Progress Notes (Signed)
Fentanyl pca changed to dilaudid PCA per MD order. 20mL waste from fentanyl syringe with d/c. Kerin RansomBlaire Smith RN witness waste in sink. Koren BoundWaggoner, Mikka Kissner

## 2013-09-28 NOTE — Plan of Care (Signed)
Problem: Phase I Progression Outcomes Goal: OOB as tolerated unless otherwise ordered Outcome: Not Met (add Reason) Pt is on restriction, lay flat, log roll only at this time

## 2013-09-28 NOTE — ED Notes (Addendum)
[facility] head lacs/wounds being cleansed, preparing to place staples, [facility] pending arrival of trauma consult, RT aware of IS and peak. No changes, pt alert, NAD, calm, interactive. VSS. HOB 30 degrees, aspen c-collar in place.

## 2013-09-28 NOTE — Progress Notes (Signed)
Peak flow 85, NIF -16 cmH2O, VC , IS .

## 2013-09-28 NOTE — Progress Notes (Signed)
Chaplain responded to Level 2 Trauma.  Pt was alert and conversing with medical staff.  ETOH was given as a cause for his fall by his spouse. Rest of the family were out for dinner, but he stayed at home alone.  Spouse reported that he must have fallen some where between 6:30 and 8:00pm.  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  Spouse concerned about pt behavior and emotional status when alcohol is involved.  Spouse and step-granddaughter were emotional when they arrived, but improved over time.  Chaplain offered emotional support through time and empathetic listening.  Spiritual support and prayer were also provided.  Chaplain is available for further assistance if desired.   09/27/13 1120  Clinical Encounter Type  Visited With Family;Health care provider  Visit Type Initial;ED;Spiritual support  Referral From Nurse  Spiritual Encounters  Spiritual Needs Prayer;Emotional  Stress Factors  Patient Stress Factors None identified  Family Stress Factors Exhausted;Loss of control   [employer] SystemsVirginia Kacey Vicuna, [address] Hospital [address]haplain

## 2013-09-28 NOTE — Progress Notes (Signed)
Orthopedic Tech Progress Note Patient Details:  Iran PlanasDonald W Muska 03/13/1946 [id number]  Patient ID: Iran Planasonald W Mase, male   DOB: 03/13/1946, [age] y.o.   MRN: [id number] Called [employer] with brace order; spoke with Marlane HatcherMike  Emelina Hinch 09/28/2013, 5:40 PM

## 2013-09-28 NOTE — ED Notes (Signed)
Aspen c-collar applied, spinal precautions maintained, assisted by D.Cooper EMT.

## 2013-09-28 NOTE — H&P (Signed)
History   Philip Ford is an 68 y.o. male.   Chief Complaint:  Chief Complaint  Patient presents with  . Trauma  . Fall    Trauma Mechanism of injury: fall Injury location: torso Injury location detail: L chest Incident location: home   Fall:      Fall occurred: down stairs      Impact surface: hard floor and furniture  EMS/PTA data:      Loss of consciousness: no      Amnesic to event: no      Immobilization: C-collar  Current symptoms:      Associated symptoms:            Reports chest pain (L chest).            Denies abdominal pain, back pain, headache, hearing loss, loss of consciousness, nausea, neck pain and vomiting.   Relevant PMH:      Medical risk factors:            Cardiac stents.            HTN      Pharmacological risk factors:            Antiplatelet therapy.  Fall Associated symptoms include chest pain (L chest). Pertinent negatives include no abdominal pain, coughing, headaches, myalgias, nausea, neck pain or vomiting.    Past Medical History  Diagnosis Date  . Coronary artery disease   . Hypertension     Past Surgical History  Procedure Laterality Date  . Coronary stent placement      History reviewed. No pertinent family history. Social History:  reports that he has been smoking.  He does not have any smokeless tobacco history on file. He reports that he drinks alcohol. He reports that he does not use illicit drugs.  Allergies  No Known Allergies  Home Medications   (Not in a hospital admission)  Trauma Course   Results for orders placed during the hospital encounter of 09/27/13 (from the past 48 hour(s))  CBC WITH DIFFERENTIAL     Status: Abnormal   Collection Time    09/27/13 11:40 PM      Result Value Ref Range   WBC 24.8 (*) 4.0 - 10.5 K/uL   RBC 4.16 (*) 4.22 - 5.81 MIL/uL   Hemoglobin 13.7  13.0 - 17.0 g/dL   HCT 39.5  39.0 - 52.0 %   MCV 95.0  78.0 - 100.0 fL   MCH 32.9  26.0 - 34.0 pg   MCHC 34.7  30.0 - 36.0 g/dL    RDW 12.8  11.5 - 15.5 %   Platelets 206  150 - 400 K/uL   Neutrophils Relative % 81 (*) 43 - 77 %   Neutro Abs 20.2 (*) 1.7 - 7.7 K/uL   Lymphocytes Relative 8 (*) 12 - 46 %   Lymphs Abs 1.9  0.7 - 4.0 K/uL   Monocytes Relative 11  3 - 12 %   Monocytes Absolute 2.6 (*) 0.1 - 1.0 K/uL   Eosinophils Relative 0  0 - 5 %   Eosinophils Absolute 0.0  0.0 - 0.7 K/uL   Basophils Relative 0  0 - 1 %   Basophils Absolute 0.1  0.0 - 0.1 K/uL  BASIC METABOLIC PANEL     Status: Abnormal   Collection Time    09/27/13 11:40 PM      Result Value Ref Range   Sodium 140  137 - 147 mEq/L  Potassium 3.7  3.7 - 5.3 mEq/L   Chloride 100  96 - 112 mEq/L   CO2 19  19 - 32 mEq/L   Glucose, Bld 159 (*) 70 - 99 mg/dL   BUN 13  6 - 23 mg/dL   Creatinine, Ser 0.90  0.50 - 1.35 mg/dL   Calcium 8.1 (*) 8.4 - 10.5 mg/dL   GFR calc non Af Amer 87 (*) >90 mL/min   GFR calc Af Amer >90  >90 mL/min   Comment: (NOTE)     The eGFR has been calculated using the CKD EPI equation.     This calculation has not been validated in all clinical situations.     eGFR's persistently <90 mL/min signify possible Chronic Kidney     Disease.  TROPONIN I     Status: None   Collection Time    09/27/13 11:40 PM      Result Value Ref Range   Troponin I <0.30  <0.30 ng/mL   Comment:            Due to the release kinetics of cTnI,     a negative result within the first hours     of the onset of symptoms does not rule out     myocardial infarction with certainty.     If myocardial infarction is still suspected,     repeat the test at appropriate intervals.  ETHANOL     Status: Abnormal   Collection Time    09/27/13 11:40 PM      Result Value Ref Range   Alcohol, Ethyl (B) 218 (*) 0 - 11 mg/dL   Comment:            LOWEST DETECTABLE LIMIT FOR     SERUM ALCOHOL IS 11 mg/dL     FOR MEDICAL PURPOSES ONLY  APTT     Status: None   Collection Time    09/27/13 11:40 PM      Result Value Ref Range   aPTT 28  24 - 37 seconds    PROTIME-INR     Status: None   Collection Time    09/27/13 11:40 PM      Result Value Ref Range   Prothrombin Time 13.2  11.6 - 15.2 seconds   INR 1.02  0.00 - 1.49  URINALYSIS, ROUTINE W REFLEX MICROSCOPIC     Status: Abnormal   Collection Time    09/27/13 11:51 PM      Result Value Ref Range   Color, Urine YELLOW  YELLOW   APPearance CLEAR  CLEAR   Specific Gravity, Urine 1.019  1.005 - 1.030   pH 5.0  5.0 - 8.0   Glucose, UA 100 (*) NEGATIVE mg/dL   Hgb urine dipstick NEGATIVE  NEGATIVE   Bilirubin Urine NEGATIVE  NEGATIVE   Ketones, ur 15 (*) NEGATIVE mg/dL   Protein, ur NEGATIVE  NEGATIVE mg/dL   Urobilinogen, UA 0.2  0.0 - 1.0 mg/dL   Nitrite NEGATIVE  NEGATIVE   Leukocytes, UA NEGATIVE  NEGATIVE   Comment: MICROSCOPIC NOT DONE ON URINES WITH NEGATIVE PROTEIN, BLOOD, LEUKOCYTES, NITRITE, OR GLUCOSE <1000 mg/dL.  POCT I-STAT, CHEM 8     Status: Abnormal   Collection Time    09/27/13 11:57 PM      Result Value Ref Range   Sodium 144  137 - 147 mEq/L   Potassium 3.5 (*) 3.7 - 5.3 mEq/L   Chloride 103  96 - 112 mEq/L   BUN  12  6 - 23 mg/dL   Creatinine, Ser 1.20  0.50 - 1.35 mg/dL   Glucose, Bld 161 (*) 70 - 99 mg/dL   Calcium, Ion 0.98 (*) 1.13 - 1.30 mmol/L   TCO2 23  0 - 100 mmol/L   Hemoglobin 14.6  13.0 - 17.0 g/dL   HCT 43.0  39.0 - 52.0 %  CG4 I-STAT (LACTIC ACID)     Status: Abnormal   Collection Time    09/27/13 11:59 PM      Result Value Ref Range   Lactic Acid, Venous 5.86 (*) 0.5 - 2.2 mmol/L   Dg Ribs Unilateral Left  09/28/2013   CLINICAL DATA:  Left rib pain, fell down stairs  EXAM: LEFT RIBS - 2 VIEW  COMPARISON:  CT scan of the abdomen and pelvis performed earlier today  FINDINGS: Multiple left-sided rib fractures including ribs 3-11. No pneumothorax. Low inspiratory volumes with mild left basilar atelectasis.  IMPRESSION: Numerous left-sided rib fractures including ribs 3, 4, 5, 6, 7, 8, 9, 10 and 11. Many of the fractures are segmental.    Electronically Signed   By: Jacqulynn Cadet M.D.   On: 09/28/2013 01:25   Ct Head Wo Contrast  09/28/2013   CLINICAL DATA:  Fall  EXAM: CT HEAD WITHOUT CONTRAST  CT CERVICAL SPINE WITHOUT CONTRAST  TECHNIQUE: Multidetector CT imaging of the head and cervical spine was performed following the standard protocol without intravenous contrast. Multiplanar CT image reconstructions of the cervical spine were also generated.  COMPARISON:  None.  FINDINGS: CT HEAD FINDINGS  Focal area of Encephalomalacia within the left frontal lobe is compatible with remote infarct. Additional scattered hypo intense foci within the periventricular and deep white matter compatible with mild chronic microvascular ischemic changes. There is no acute intracranial hemorrhage or infarct. No mass lesion or midline shift. Gray-white matter differentiation is well maintained. Ventricles are normal in size without evidence of hydrocephalus. CSF containing spaces are within normal limits. No extra-axial fluid collection.  The calvarium is intact.  Orbital soft tissues are within normal limits.  Mild polypoid opacity present within the right maxillary sinus. There is mild mucoperiosteal thickening within the ethmoidal air cells and frontal sinuses. No mastoid effusion.  Left parietal scalp contusion present with laceration. No radiopaque foreign body.  CT CERVICAL SPINE FINDINGS  There is straightening of the normal cervical lordosis. Trace retrolisthesis of C5 on C6 is present. Vertebral body heights are preserved. Normal C1-2 articulations are intact. No prevertebral soft tissue swelling.  No acute fracture or listhesis.  Moderate degenerative disc disease as evidenced by intervertebral disc space narrowing and endplate osteophytosis is present at C5-6. Additional mild multilevel degenerative disc disease seen throughout the cervical spine.  Visualized soft tissues of the neck are within normal limits. Atherosclerotic calcifications noted about  the carotid bifurcations. Scattered atherosclerotic plaque also noted within the proximal right vertebral artery.  IMPRESSION: CT BRAIN:  1. Left parietal scalp contusion/laceration. No acute intracranial process. 2. Remote left frontal lobe infarct. 3. Mild chronic microvascular ischemic changes.  CT CERVICAL SPINE:  1. No acute traumatic injury within the cervical spine. 2. Moderate degenerative disc disease as above, most severe at C5-6.   Electronically Signed   By: Jeannine Boga M.D.   On: 09/28/2013 00:39   Ct Cervical Spine Wo Contrast  09/28/2013   CLINICAL DATA:  Fall  EXAM: CT HEAD WITHOUT CONTRAST  CT CERVICAL SPINE WITHOUT CONTRAST  TECHNIQUE: Multidetector CT imaging of the head and  cervical spine was performed following the standard protocol without intravenous contrast. Multiplanar CT image reconstructions of the cervical spine were also generated.  COMPARISON:  None.  FINDINGS: CT HEAD FINDINGS  Focal area of Encephalomalacia within the left frontal lobe is compatible with remote infarct. Additional scattered hypo intense foci within the periventricular and deep white matter compatible with mild chronic microvascular ischemic changes. There is no acute intracranial hemorrhage or infarct. No mass lesion or midline shift. Gray-white matter differentiation is well maintained. Ventricles are normal in size without evidence of hydrocephalus. CSF containing spaces are within normal limits. No extra-axial fluid collection.  The calvarium is intact.  Orbital soft tissues are within normal limits.  Mild polypoid opacity present within the right maxillary sinus. There is mild mucoperiosteal thickening within the ethmoidal air cells and frontal sinuses. No mastoid effusion.  Left parietal scalp contusion present with laceration. No radiopaque foreign body.  CT CERVICAL SPINE FINDINGS  There is straightening of the normal cervical lordosis. Trace retrolisthesis of C5 on C6 is present. Vertebral body  heights are preserved. Normal C1-2 articulations are intact. No prevertebral soft tissue swelling.  No acute fracture or listhesis.  Moderate degenerative disc disease as evidenced by intervertebral disc space narrowing and endplate osteophytosis is present at C5-6. Additional mild multilevel degenerative disc disease seen throughout the cervical spine.  Visualized soft tissues of the neck are within normal limits. Atherosclerotic calcifications noted about the carotid bifurcations. Scattered atherosclerotic plaque also noted within the proximal right vertebral artery.  IMPRESSION: CT BRAIN:  1. Left parietal scalp contusion/laceration. No acute intracranial process. 2. Remote left frontal lobe infarct. 3. Mild chronic microvascular ischemic changes.  CT CERVICAL SPINE:  1. No acute traumatic injury within the cervical spine. 2. Moderate degenerative disc disease as above, most severe at C5-6.   Electronically Signed   By: Jeannine Boga M.D.   On: 09/28/2013 00:39   Ct Abdomen Pelvis W Contrast  09/28/2013   ADDENDUM REPORT: 09/28/2013 01:25  ADDENDUM: Nondisplaced fracture through the posterior aspect of the left eleventh rib as well.   Electronically Signed   By: Jacqulynn Cadet M.D.   On: 09/28/2013 01:25   09/28/2013   CLINICAL DATA:  Fall, left rib pain  EXAM: CT ABDOMEN AND PELVIS WITH CONTRAST  TECHNIQUE: Multidetector CT imaging of the abdomen and pelvis was performed using the standard protocol following bolus administration of intravenous contrast.  CONTRAST:  156m OMNIPAQUE IOHEXOL 300 MG/ML  SOLN  COMPARISON:  None.  FINDINGS: Lower Chest: Extensive patient and respiratory motion limits evaluation for small pulmonary nodules. Dependent atelectasis is noted in the bilateral lower lobes. The heart is within normal limits for size. No pericardial effusion. Coronary artery calcifications are present. Small left pleural effusion.  Abdomen: Unremarkable CT appearance of the stomach, duodenum,  spleen, adrenal glands, and pancreas. Punctate calcification in the right hepatic lobe suggest old granulomatous disease. No focal hepatic lesion or abnormality. Gallbladder is unremarkable. No intra or extrahepatic biliary ductal dilatation. Calcified porta hepatis node.  Unremarkable appearance of the bilateral kidneys. No focal solid lesion, hydronephrosis or nephrolithiasis. Subcentimeter low-attenuation lesions bilaterally are too small to characterize but statistically highly likely benign cysts.  No evidence of obstruction or focal bowel wall thickening. Normal appendix in the right lower quadrant. The terminal ileum is unremarkable. No free fluid or suspicious adenopathy.  Pelvis: The bladder is distended with urine. Otherwise, unremarkable male pelvis. No free fluid or suspicious adenopathy.  Bones/Soft Tissues: Numerous left-sided rib and  transverse process fractures. Segmental left tenth rib fracture with nondisplaced fractures laterally and posteriorly rib as well as a nondisplaced fracture through the left transverse process of T10. Segmental ninth rib fracture with nondisplaced fracture laterally and displaced fracture posteriorly. Additionally, the neck of the rib is fractured as a is the left transverse process of T9. Displaced fracture through the posterior aspect of the eighth rib with a nondisplaced fracture of the left transverse process of T8. Segmental left seventh rib fracture with nondisplaced anterior fracture and nondisplaced posterior rib fracture. Nondisplaced fracture through the left transverse process of T7. Segmental left sixth rib fracture. T6 is incompletely imaged. Minimally displaced fracture of the left transverse process of L1. There appears to be nondisplaced fracture through a prominent anterior osteophyte at T9 extending into the T9 vertebral body. No evidence of vertebral body height loss.  Vascular: Atherosclerotic vascular disease without significant stenosis or aneurysmal  dilatation.  IMPRESSION: 1. Incompletely imaged multifocal trauma to the left hemi thorax including segmental fractures of the left sixth through tenth ribs, nondisplaced fractures of the left transverse processes of T7 through T10 and a probable nondisplaced fracture through the inferior aspect of the T9 vertebral body without evidence of significant height loss. Consider dedicated chest CT for complete evaluation. 2. Small left pleural effusion and associated left basilar atelectasis. No definite pneumothorax in the visualized portion of the lung. 3. Nondisplaced fracture through the left transverse process of L1. 4. No definite acute injury in the abdomen or pelvis. 5. Atherosclerosis including coronary artery disease.  Electronically Signed: By: Jacqulynn Cadet M.D. On: 09/28/2013 00:58   Dg Pelvis Portable  09/27/2013   CLINICAL DATA:  Fall.  Left hip pain.  EXAM: PORTABLE PELVIS 1-2 VIEWS  COMPARISON:  None.  FINDINGS: Evaluation is mildly limited by external support equipment/ backboard. No acute fracture is identified. The femoral heads are approximated with the acetabula on this single frontal projection. Hip joint spaces are preserved. No focal lytic or blastic osseous lesion is seen. Degenerative disc disease is noted in the visualized lower lumbar spine.  IMPRESSION: No acute osseous abnormality identified.   Electronically Signed   By: Logan Bores   On: 09/27/2013 23:51   Dg Chest Port 1 View  09/27/2013   CLINICAL DATA:  Fall, trauma.  Left lower rib pain  EXAM: PORTABLE CHEST - 1 VIEW  COMPARISON:  Prior chest x-ray 10/29/2003  FINDINGS: Limited single frontal view of the chest with metallic artifact projecting over the image. Stable cardiac and mediastinal contours given differences in portable AP technique. No pneumothorax or pleural effusion. The lungs are clear. At least 2 left-sided rib fractures are identified. There are acute minimally displaced fractures of the antral lateral aspect  of left ribs 6 and 7.  IMPRESSION: Acute minimally displaced fractures of the anterolateral aspects of left ribs 6 and 7. No pneumothorax identified.  Of note, left ribs 8 and 9 are incompletely imaged.   Electronically Signed   By: Jacqulynn Cadet M.D.   On: 09/27/2013 23:52    Review of Systems  Constitutional: Negative for weight loss.  HENT: Negative for ear discharge, ear pain, hearing loss and tinnitus.   Eyes: Negative for blurred vision, double vision, photophobia and pain.  Respiratory: Negative for cough, sputum production and shortness of breath.   Cardiovascular: Positive for chest pain (L chest).  Gastrointestinal: Negative for nausea, vomiting and abdominal pain.  Genitourinary: Negative for dysuria, urgency, frequency and flank pain.  Musculoskeletal: Negative for  back pain, falls, joint pain, myalgias and neck pain.  Neurological: Negative for dizziness, tingling, sensory change, focal weakness, loss of consciousness and headaches.  Endo/Heme/Allergies: Does not bruise/bleed easily.  Psychiatric/Behavioral: Negative for depression, memory loss and substance abuse. The patient is not nervous/anxious.     Blood pressure 133/72, pulse 101, temperature 100 F (37.8 C), temperature source Oral, resp. rate 23, SpO2 96.00%. Physical Exam  Vitals reviewed. Constitutional: He is oriented to person, place, and time. He appears well-developed and well-nourished. He is cooperative. No distress. Cervical collar and nasal cannula in place.  HENT:  Head: Normocephalic. Head is without raccoon's eyes, without Battle's sign, without abrasion, without contusion and without laceration.    Right Ear: Hearing, tympanic membrane, external ear and ear canal normal. No lacerations. No drainage or tenderness. No foreign bodies. Tympanic membrane is not perforated. No hemotympanum.  Left Ear: Hearing, tympanic membrane, external ear and ear canal normal. No lacerations. No drainage or tenderness.  No foreign bodies. Tympanic membrane is not perforated. No hemotympanum.  Nose: Nose normal. No nose lacerations, sinus tenderness, nasal deformity or nasal septal hematoma. No epistaxis.  Mouth/Throat: Uvula is midline, oropharynx is clear and moist and mucous membranes are normal. No lacerations.  Laceration that has been stapled  Eyes: Conjunctivae, EOM and lids are normal. Pupils are equal, round, and reactive to light. No scleral icterus.  Neck: Trachea normal. No JVD present. No spinous process tenderness and no muscular tenderness present. Carotid bruit is not present. No thyromegaly present.  Cardiovascular: Normal rate, regular rhythm, normal heart sounds, intact distal pulses and normal pulses.   Respiratory: Effort normal and breath sounds normal. No respiratory distress. He exhibits tenderness (L chest wall). He exhibits no bony tenderness, no laceration and no crepitus.  GI: Soft. Normal appearance and bowel sounds are normal. He exhibits no distension. There is no tenderness. There is no rigidity, no rebound, no guarding and no CVA tenderness.  Musculoskeletal: Normal range of motion. He exhibits no edema and no tenderness.  Lymphadenopathy:    He has no cervical adenopathy.  Neurological: He is alert and oriented to person, place, and time. He has normal strength. No cranial nerve deficit or sensory deficit. GCS eye subscore is 4. GCS verbal subscore is 5. GCS motor subscore is 6.  No TTP to spine  Skin: Skin is warm, dry and intact. He is not diaphoretic.  Psychiatric: He has a normal mood and affect. His speech is normal and behavior is normal.     Assessment/Plan 68 y/o M s/p Fall  L 3-11 rib fx L TP fx T7-10 T9 vertebral body fx L1 TP fx Posterior head lac  Plan: Admit for pain control of rib fx and Pulm toilet Dr. Hal Neer of [facility] was notified of pts spinal fx Pt will need thoracic CT to fully eval spinal column Head Lac stapled per EDP - local wound care.   Rosario Jacks., Terese Heier 09/28/2013, 2:23 AM   Procedures

## 2013-09-29 ENCOUNTER — Encounter (HOSPITAL_COMMUNITY): Payer: Medicare Other | Admitting: Anesthesiology

## 2013-09-29 ENCOUNTER — Inpatient Hospital Stay (HOSPITAL_COMMUNITY): Payer: Medicare Other

## 2013-09-29 ENCOUNTER — Inpatient Hospital Stay (HOSPITAL_COMMUNITY): Payer: Medicare Other | Admitting: Anesthesiology

## 2013-09-29 DIAGNOSIS — D62 Acute posthemorrhagic anemia: Secondary | ICD-10-CM

## 2013-09-29 DIAGNOSIS — R7309 Other abnormal glucose: Secondary | ICD-10-CM

## 2013-09-29 LAB — POCT I-STAT 3, ART BLOOD GAS (G3+)
Acid-Base Excess: 2 mmol/L (ref 0.0–2.0)
Bicarbonate: 28.7 mEq/L — ABNORMAL HIGH (ref 20.0–24.0)
O2 Saturation: 86 %
Patient temperature: 102.3
TCO2: 30 mmol/L (ref 0–100)
pCO2 arterial: 55.7 mmHg — ABNORMAL HIGH (ref 35.0–45.0)
pH, Arterial: 7.329 — ABNORMAL LOW (ref 7.350–7.450)
pO2, Arterial: 62 mmHg — ABNORMAL LOW (ref 80.0–100.0)

## 2013-09-29 LAB — BASIC METABOLIC PANEL
BUN: 12 mg/dL (ref 6–23)
CO2: 28 mEq/L (ref 19–32)
Calcium: 7.9 mg/dL — ABNORMAL LOW (ref 8.4–10.5)
Chloride: 102 mEq/L (ref 96–112)
Creatinine, Ser: 0.85 mg/dL (ref 0.50–1.35)
GFR calc Af Amer: >90 mL/min (ref >90)
GFR calc non Af Amer: 89 mL/min — ABNORMAL LOW (ref >90)
Glucose, Bld: 161 mg/dL — ABNORMAL HIGH (ref 70–99)
Potassium: 4 mEq/L (ref 3.7–5.3)
Sodium: 141 mEq/L (ref 137–147)

## 2013-09-29 LAB — CBC
HCT: 35 % — ABNORMAL LOW (ref 39.0–52.0)
Hemoglobin: 11.8 g/dL — ABNORMAL LOW (ref 13.0–17.0)
MCH: 32.8 pg (ref 26.0–34.0)
MCHC: 33.7 g/dL (ref 30.0–36.0)
MCV: 97.2 fL (ref 78.0–100.0)
Platelets: 136 10*3/uL — ABNORMAL LOW (ref 150–400)
RBC: 3.6 MIL/uL — ABNORMAL LOW (ref 4.22–5.81)
RDW: 12.9 % (ref 11.5–15.5)
WBC: 13.9 10*3/uL — ABNORMAL HIGH (ref 4.0–10.5)

## 2013-09-29 LAB — GLUCOSE, CAPILLARY
Glucose-Capillary: 169 mg/dL — ABNORMAL HIGH (ref 70–99)
Glucose-Capillary: 134 mg/dL — ABNORMAL HIGH (ref 70–99)
Glucose-Capillary: 154 mg/dL — ABNORMAL HIGH (ref 70–99)

## 2013-09-29 LAB — MAGNESIUM: Magnesium: 1.4 mg/dL — ABNORMAL LOW (ref 1.5–2.5)

## 2013-09-29 MED ORDER — METOPROLOL TARTRATE 1 MG/ML IV SOLN
5.0000 mg | Freq: Four times a day (QID) | INTRAVENOUS | Status: DC | PRN
  Administered 2013-09-29: 2.5 mg via INTRAVENOUS
  Administered 2013-09-29: 5 mg via INTRAVENOUS
  Filled 2013-09-29 (×2): qty 5

## 2013-09-29 MED ORDER — SUCCINYLCHOLINE CHLORIDE 20 MG/ML IJ SOLN
INTRAMUSCULAR | Status: DC | PRN
  Administered 2013-09-29: 140 mg via INTRAVENOUS

## 2013-09-29 MED ORDER — LORAZEPAM 1 MG PO TABS: 1.0000 mg | ORAL_TABLET | Freq: Four times a day (QID) | ORAL | Status: AC | PRN

## 2013-09-29 MED ORDER — INSULIN ASPART 100 UNIT/ML ~~LOC~~ SOLN
0.0000 [IU] | Freq: Three times a day (TID) | SUBCUTANEOUS | Status: DC
  Administered 2013-09-29: 2 [IU] via SUBCUTANEOUS
  Administered 2013-09-29: 3 [IU] via SUBCUTANEOUS

## 2013-09-29 MED ORDER — FOLIC ACID 1 MG PO TABS
1.0000 mg | ORAL_TABLET | Freq: Every day | ORAL | Status: DC
  Administered 2013-09-29: 1 mg via ORAL
  Filled 2013-09-29 (×2): qty 1

## 2013-09-29 MED ORDER — MIDAZOLAM HCL 2 MG/2ML IJ SOLN
2.0000 mg | INTRAMUSCULAR | Status: DC | PRN
  Administered 2013-09-29 – 2013-09-30 (×3): 2 mg via INTRAVENOUS
  Filled 2013-09-29 (×2): qty 2

## 2013-09-29 MED ORDER — FENTANYL CITRATE 0.05 MG/ML IJ SOLN
100.0000 ug | INTRAMUSCULAR | Status: DC | PRN
  Administered 2013-09-29 – 2013-10-05 (×26): 100 ug via INTRAVENOUS
  Filled 2013-09-29 (×27): qty 2

## 2013-09-29 MED ORDER — ACETAMINOPHEN 650 MG RE SUPP
650.0000 mg | Freq: Four times a day (QID) | RECTAL | Status: DC | PRN
  Administered 2013-09-29: 650 mg via RECTAL

## 2013-09-29 MED ORDER — LIDOCAINE HCL (CARDIAC) 20 MG/ML IV SOLN
INTRAVENOUS | Status: DC | PRN
  Administered 2013-09-29: 100 mg via INTRAVENOUS

## 2013-09-29 MED ORDER — LORAZEPAM 2 MG/ML IJ SOLN
1.0000 mg | Freq: Four times a day (QID) | INTRAMUSCULAR | Status: AC | PRN
Start: 2013-09-29 — End: 2013-10-02
  Administered 2013-09-30: 1 mg via INTRAVENOUS
  Filled 2013-09-29: qty 1

## 2013-09-29 MED ORDER — FENTANYL CITRATE 0.05 MG/ML IJ SOLN
INTRAMUSCULAR | Status: AC
  Administered 2013-09-29: 100 ug
  Filled 2013-09-29: qty 2

## 2013-09-29 MED ORDER — PROPOFOL 10 MG/ML IV BOLUS
INTRAVENOUS | Status: DC | PRN
Start: 2013-09-29 — End: 2013-09-29
  Administered 2013-09-29: 80 mg via INTRAVENOUS

## 2013-09-29 MED ORDER — SODIUM CHLORIDE 0.9 % IV SOLN
INTRAVENOUS | Status: DC
  Administered 2013-09-29: 10 mL/h via INTRAVENOUS
  Administered 2013-09-30: 20 mL/h via INTRAVENOUS
  Administered 2013-09-30: 75 mL/h via INTRAVENOUS
  Administered 2013-10-01: 05:00:00 via INTRAVENOUS
  Administered 2013-10-01: 20 mL/h via INTRAVENOUS
  Administered 2013-10-05 (×2): via INTRAVENOUS

## 2013-09-29 MED ORDER — CHLORHEXIDINE GLUCONATE 0.12 % MT SOLN
OROMUCOSAL | Status: AC
  Administered 2013-09-29: 15 mL via OROMUCOSAL
  Filled 2013-09-29: qty 15

## 2013-09-29 MED ORDER — OXYCODONE HCL 5 MG PO TABS
10.0000 mg | ORAL_TABLET | ORAL | Status: DC | PRN
  Administered 2013-09-29 (×2): 10 mg via ORAL
  Filled 2013-09-29: qty 2
  Filled 2013-09-29: qty 3

## 2013-09-29 MED ORDER — ADULT MULTIVITAMIN W/MINERALS CH
1.0000 | ORAL_TABLET | Freq: Every day | ORAL | Status: DC
  Administered 2013-09-29: 1 via ORAL
  Filled 2013-09-29 (×2): qty 1

## 2013-09-29 MED ORDER — IPRATROPIUM-ALBUTEROL 0.5-2.5 (3) MG/3ML IN SOLN
3.0000 mL | RESPIRATORY_TRACT | Status: DC
  Administered 2013-09-29 – 2013-10-02 (×16): 3 mL via RESPIRATORY_TRACT
  Filled 2013-09-29 (×16): qty 3

## 2013-09-29 MED ORDER — MIDAZOLAM HCL 2 MG/2ML IJ SOLN
INTRAMUSCULAR | Status: AC
  Administered 2013-09-29: 2 mg
  Filled 2013-09-29: qty 2

## 2013-09-29 MED ORDER — ACETAMINOPHEN 650 MG RE SUPP
650.0000 mg | RECTAL | Status: DC | PRN
  Filled 2013-09-29: qty 1

## 2013-09-29 NOTE — Progress Notes (Signed)
Orthopedic Tech Progress Note Patient Details:  Iran PlanasDonald W Mccullars November 05, 1945 [id number]  Patient ID: Iran Planasonald W Peltzer, male   DOB: November 05, 1945, 68 y.o.   MRN: [id number]  Brace order completed by Storm Friskbio-tech Dallyn Bergland 09/29/2013, 3:31 PM

## 2013-09-29 NOTE — Progress Notes (Signed)
[facility] re: pts hypooxygenation.  Pt has had a diffcult time coughing and clearing his secretions at this time.  ABG revealed PAO2 62.  On exam pt with very poor cough and still with mucus secretions.  I tried NT suctioning with minimal cough provocation.  CXR with some atelectasis/effusion in his LLL. At this time I think it would be better to intubate the pt and avoid an emergent airway issue.  I have talked with his wife and updated her on his situation.  Awaiting anesthesia for intubation currently.

## 2013-09-29 NOTE — Progress Notes (Signed)
Dr Derrell Lollingamirez updated temp 101.5 orally, pt continues to work with [facility] ([phone]). Productive cough with thick/tan secretions. Will continue to monitor. Koren BoundWaggoner, Marelly Wehrman

## 2013-09-29 NOTE — Progress Notes (Signed)
NIF - 60, VC 500 ml.

## 2013-09-29 NOTE — Consult Note (Signed)
Reason for Consult:T9 fracture Referring Physician: Trauma MD  Philip Ford is an 68 y.o. male.  HPI: The patient is 68 yo male involved in fall down stairs. Came to [facility] where work up showed multiple rib fractures, T9 fracture, and multiple transverse process fractures. Neurosurgical input requested.  Past Medical History  Diagnosis Date  . Coronary artery disease   . Hypertension     Past Surgical History  Procedure Laterality Date  . Coronary stent placement      History reviewed. No pertinent family history.  Social History:  reports that he has been smoking.  He does not have any smokeless tobacco history on file. He reports that he drinks alcohol. He reports that he does not use illicit drugs.  Allergies: No Known Allergies  Medications: I have reviewed the patient's current medications.  Results for orders placed during the hospital encounter of 09/27/13 (from the past 48 hour(s))  CBC WITH DIFFERENTIAL     Status: Abnormal   Collection Time    09/27/13 11:40 PM      Result Value Ref Range   WBC 24.8 (*) 4.0 - 10.5 K/uL   RBC 4.16 (*) 4.22 - 5.81 MIL/uL   Hemoglobin 13.7  13.0 - 17.0 g/dL   HCT 39.5  39.0 - 52.0 %   MCV 95.0  78.0 - 100.0 fL   MCH 32.9  26.0 - 34.0 pg   MCHC 34.7  30.0 - 36.0 g/dL   RDW 12.8  11.5 - 15.5 %   Platelets 206  150 - 400 K/uL   Neutrophils Relative % 81 (*) 43 - 77 %   Neutro Abs 20.2 (*) 1.7 - 7.7 K/uL   Lymphocytes Relative 8 (*) 12 - 46 %   Lymphs Abs 1.9  0.7 - 4.0 K/uL   Monocytes Relative 11  3 - 12 %   Monocytes Absolute 2.6 (*) 0.1 - 1.0 K/uL   Eosinophils Relative 0  0 - 5 %   Eosinophils Absolute 0.0  0.0 - 0.7 K/uL   Basophils Relative 0  0 - 1 %   Basophils Absolute 0.1  0.0 - 0.1 K/uL  BASIC METABOLIC PANEL     Status: Abnormal   Collection Time    09/27/13 11:40 PM      Result Value Ref Range   Sodium 140  137 - 147 mEq/L   Potassium 3.7  3.7 - 5.3 mEq/L   Chloride 100  96 - 112 mEq/L   CO2 19  19 - 32  mEq/L   Glucose, Bld 159 (*) 70 - 99 mg/dL   BUN 13  6 - 23 mg/dL   Creatinine, Ser 0.90  0.50 - 1.35 mg/dL   Calcium 8.1 (*) 8.4 - 10.5 mg/dL   GFR calc non Af Amer 87 (*) >90 mL/min   GFR calc Af Amer >90  >90 mL/min   Comment: (NOTE)     The eGFR has been calculated using the CKD EPI equation.     This calculation has not been validated in all clinical situations.     eGFR's persistently <90 mL/min signify possible Chronic Kidney     Disease.  TROPONIN I     Status: None   Collection Time    09/27/13 11:40 PM      Result Value Ref Range   Troponin I <0.30  <0.30 ng/mL   Comment:            Due to the release kinetics  of cTnI,     a negative result within the first hours     of the onset of symptoms does not rule out     myocardial infarction with certainty.     If myocardial infarction is still suspected,     repeat the test at appropriate intervals.  ETHANOL     Status: Abnormal   Collection Time    09/27/13 11:40 PM      Result Value Ref Range   Alcohol, Ethyl (B) 218 (*) 0 - 11 mg/dL   Comment:            LOWEST DETECTABLE LIMIT FOR     SERUM ALCOHOL IS 11 mg/dL     FOR MEDICAL PURPOSES ONLY  APTT     Status: None   Collection Time    09/27/13 11:40 PM      Result Value Ref Range   aPTT 28  24 - 37 seconds  PROTIME-INR     Status: None   Collection Time    09/27/13 11:40 PM      Result Value Ref Range   Prothrombin Time 13.2  11.6 - 15.2 seconds   INR 1.02  0.00 - 1.49  URINALYSIS, ROUTINE W REFLEX MICROSCOPIC     Status: Abnormal   Collection Time    09/27/13 11:51 PM      Result Value Ref Range   Color, Urine YELLOW  YELLOW   APPearance CLEAR  CLEAR   Specific Gravity, Urine 1.019  1.005 - 1.030   pH 5.0  5.0 - 8.0   Glucose, UA 100 (*) NEGATIVE mg/dL   Hgb urine dipstick NEGATIVE  NEGATIVE   Bilirubin Urine NEGATIVE  NEGATIVE   Ketones, ur 15 (*) NEGATIVE mg/dL   Protein, ur NEGATIVE  NEGATIVE mg/dL   Urobilinogen, UA 0.2  0.0 - 1.0 mg/dL   Nitrite  NEGATIVE  NEGATIVE   Leukocytes, UA NEGATIVE  NEGATIVE   Comment: MICROSCOPIC NOT DONE ON URINES WITH NEGATIVE PROTEIN, BLOOD, LEUKOCYTES, NITRITE, OR GLUCOSE <1000 mg/dL.  POCT I-STAT, CHEM 8     Status: Abnormal   Collection Time    09/27/13 11:57 PM      Result Value Ref Range   Sodium 144  137 - 147 mEq/L   Potassium 3.5 (*) 3.7 - 5.3 mEq/L   Chloride 103  96 - 112 mEq/L   BUN 12  6 - 23 mg/dL   Creatinine, Ser 1.20  0.50 - 1.35 mg/dL   Glucose, Bld 161 (*) 70 - 99 mg/dL   Calcium, Ion 0.98 (*) 1.13 - 1.30 mmol/L   TCO2 23  0 - 100 mmol/L   Hemoglobin 14.6  13.0 - 17.0 g/dL   HCT 43.0  39.0 - 52.0 %  CG4 I-STAT (LACTIC ACID)     Status: Abnormal   Collection Time    09/27/13 11:59 PM      Result Value Ref Range   Lactic Acid, Venous 5.86 (*) 0.5 - 2.2 mmol/L  MRSA PCR SCREENING     Status: None   Collection Time    09/28/13  4:05 AM      Result Value Ref Range   MRSA by PCR NEGATIVE  NEGATIVE   Comment:            The GeneXpert MRSA Assay (FDA     approved for NASAL specimens     only), is one component of a     comprehensive MRSA colonization  surveillance program. It is not     intended to diagnose MRSA     infection nor to guide or     monitor treatment for     MRSA infections.  CBC     Status: Abnormal   Collection Time    09/29/13  3:08 AM      Result Value Ref Range   WBC 13.9 (*) 4.0 - 10.5 K/uL   RBC 3.60 (*) 4.22 - 5.81 MIL/uL   Hemoglobin 11.8 (*) 13.0 - 17.0 g/dL   Comment: REPEATED TO VERIFY   HCT 35.0 (*) 39.0 - 52.0 %   MCV 97.2  78.0 - 100.0 fL   MCH 32.8  26.0 - 34.0 pg   MCHC 33.7  30.0 - 36.0 g/dL   RDW 12.9  11.5 - 15.5 %   Platelets 136 (*) 150 - 400 K/uL   Comment: REPEATED TO VERIFY     DELTA CHECK NOTED  BASIC METABOLIC PANEL     Status: Abnormal   Collection Time    09/29/13  3:08 AM      Result Value Ref Range   Sodium 141  137 - 147 mEq/L   Potassium 4.0  3.7 - 5.3 mEq/L   Chloride 102  96 - 112 mEq/L   CO2 28  19 - 32 mEq/L    Glucose, Bld 161 (*) 70 - 99 mg/dL   BUN 12  6 - 23 mg/dL   Creatinine, Ser 0.85  0.50 - 1.35 mg/dL   Calcium 7.9 (*) 8.4 - 10.5 mg/dL   GFR calc non Af Amer 89 (*) >90 mL/min   GFR calc Af Amer >90  >90 mL/min   Comment: (NOTE)     The eGFR has been calculated using the CKD EPI equation.     This calculation has not been validated in all clinical situations.     eGFR's persistently <90 mL/min signify possible Chronic Kidney     Disease.  MAGNESIUM     Status: Abnormal   Collection Time    09/29/13  3:08 AM      Result Value Ref Range   Magnesium 1.4 (*) 1.5 - 2.5 mg/dL    Dg Ribs Unilateral Left  09/28/2013   CLINICAL DATA:  Left rib pain, fell down stairs  EXAM: LEFT RIBS - 2 VIEW  COMPARISON:  CT scan of the abdomen and pelvis performed earlier today  FINDINGS: Multiple left-sided rib fractures including ribs 3-11. No pneumothorax. Low inspiratory volumes with mild left basilar atelectasis.  IMPRESSION: Numerous left-sided rib fractures including ribs 3, 4, 5, 6, 7, 8, 9, 10 and 11. Many of the fractures are segmental.   Electronically Signed   By: Jacqulynn Cadet M.D.   On: 09/28/2013 01:25   Ct Head Wo Contrast  09/28/2013   CLINICAL DATA:  Fall  EXAM: CT HEAD WITHOUT CONTRAST  CT CERVICAL SPINE WITHOUT CONTRAST  TECHNIQUE: Multidetector CT imaging of the head and cervical spine was performed following the standard protocol without intravenous contrast. Multiplanar CT image reconstructions of the cervical spine were also generated.  COMPARISON:  None.  FINDINGS: CT HEAD FINDINGS  Focal area of Encephalomalacia within the left frontal lobe is compatible with remote infarct. Additional scattered hypo intense foci within the periventricular and deep white matter compatible with mild chronic microvascular ischemic changes. There is no acute intracranial hemorrhage or infarct. No mass lesion or midline shift. Gray-white matter differentiation is well maintained. Ventricles are normal in  size  without evidence of hydrocephalus. CSF containing spaces are within normal limits. No extra-axial fluid collection.  The calvarium is intact.  Orbital soft tissues are within normal limits.  Mild polypoid opacity present within the right maxillary sinus. There is mild mucoperiosteal thickening within the ethmoidal air cells and frontal sinuses. No mastoid effusion.  Left parietal scalp contusion present with laceration. No radiopaque foreign body.  CT CERVICAL SPINE FINDINGS  There is straightening of the normal cervical lordosis. Trace retrolisthesis of C5 on C6 is present. Vertebral body heights are preserved. Normal C1-2 articulations are intact. No prevertebral soft tissue swelling.  No acute fracture or listhesis.  Moderate degenerative disc disease as evidenced by intervertebral disc space narrowing and endplate osteophytosis is present at C5-6. Additional mild multilevel degenerative disc disease seen throughout the cervical spine.  Visualized soft tissues of the neck are within normal limits. Atherosclerotic calcifications noted about the carotid bifurcations. Scattered atherosclerotic plaque also noted within the proximal right vertebral artery.  IMPRESSION: CT BRAIN:  1. Left parietal scalp contusion/laceration. No acute intracranial process. 2. Remote left frontal lobe infarct. 3. Mild chronic microvascular ischemic changes.  CT CERVICAL SPINE:  1. No acute traumatic injury within the cervical spine. 2. Moderate degenerative disc disease as above, most severe at C5-6.   Electronically Signed   By: Jeannine Boga M.D.   On: 09/28/2013 00:39   Ct Cervical Spine Wo Contrast  09/28/2013   CLINICAL DATA:  Fall  EXAM: CT HEAD WITHOUT CONTRAST  CT CERVICAL SPINE WITHOUT CONTRAST  TECHNIQUE: Multidetector CT imaging of the head and cervical spine was performed following the standard protocol without intravenous contrast. Multiplanar CT image reconstructions of the cervical spine were also generated.   COMPARISON:  None.  FINDINGS: CT HEAD FINDINGS  Focal area of Encephalomalacia within the left frontal lobe is compatible with remote infarct. Additional scattered hypo intense foci within the periventricular and deep white matter compatible with mild chronic microvascular ischemic changes. There is no acute intracranial hemorrhage or infarct. No mass lesion or midline shift. Gray-white matter differentiation is well maintained. Ventricles are normal in size without evidence of hydrocephalus. CSF containing spaces are within normal limits. No extra-axial fluid collection.  The calvarium is intact.  Orbital soft tissues are within normal limits.  Mild polypoid opacity present within the right maxillary sinus. There is mild mucoperiosteal thickening within the ethmoidal air cells and frontal sinuses. No mastoid effusion.  Left parietal scalp contusion present with laceration. No radiopaque foreign body.  CT CERVICAL SPINE FINDINGS  There is straightening of the normal cervical lordosis. Trace retrolisthesis of C5 on C6 is present. Vertebral body heights are preserved. Normal C1-2 articulations are intact. No prevertebral soft tissue swelling.  No acute fracture or listhesis.  Moderate degenerative disc disease as evidenced by intervertebral disc space narrowing and endplate osteophytosis is present at C5-6. Additional mild multilevel degenerative disc disease seen throughout the cervical spine.  Visualized soft tissues of the neck are within normal limits. Atherosclerotic calcifications noted about the carotid bifurcations. Scattered atherosclerotic plaque also noted within the proximal right vertebral artery.  IMPRESSION: CT BRAIN:  1. Left parietal scalp contusion/laceration. No acute intracranial process. 2. Remote left frontal lobe infarct. 3. Mild chronic microvascular ischemic changes.  CT CERVICAL SPINE:  1. No acute traumatic injury within the cervical spine. 2. Moderate degenerative disc disease as above,  most severe at C5-6.   Electronically Signed   By: Jeannine Boga M.D.   On: 09/28/2013 00:39  Ct Thoracic Spine Wo Contrast  09/28/2013   CLINICAL DATA:  Fall.  Left rib pain.  Evaluate for spinal fracture.  EXAM: CT THORACIC SPINE WITHOUT CONTRAST  TECHNIQUE: Multidetector CT imaging of the thoracic spine was performed without intravenous contrast administration. Multiplanar CT image reconstructions were also generated.  COMPARISON:  Rib radiographs 09/28/2013. CT abdomen and pelvis 09/28/2013.  FINDINGS: Thoracic vertebral alignment is normal. Multilevel bridging osteophytosis is present. Vertebral body heights are preserved without evidence of compression fracture. There is a nondisplaced fracture involving a right-sided osteophyte at T9 which also involves the right lateral and anterior portions of the vertebral body without height loss or definite comminution. The fracture does not clearly extend into the posterior half of the vertebral body, and no involvement of the posterior elements is identified. There are nondisplaced left T7 through L2 transverse process fractures. There are fractures through the posterior left fourth, fifth, ninth, and tenth ribs. Additional rib fractures described on recent abdomen pelvis CT were not included on this study.  Dependent subsegmental atelectasis is present within the visualized portions of both lungs. Excreted IV contrast is partially visualized in the renal collecting systems. 9 mm left upper pole renal hypodensity is too small to fully characterize but most likely represents a cyst. Moderate aortic atherosclerosis is partially visualized.  IMPRESSION: 1. Nondisplaced fracture involving a right-sided osteophyte at T9 with extension into the right lateral and anterior portions of the T9 vertebral body. No height loss or involvement of the middle or posterior columns. 2. Fractures of the left T7 through L2 transverse processes and multiple left-sided ribs.    Electronically Signed   By: Logan Bores   On: 09/28/2013 04:13   Ct Abdomen Pelvis W Contrast  09/28/2013   ADDENDUM REPORT: 09/28/2013 01:25  ADDENDUM: Nondisplaced fracture through the posterior aspect of the left eleventh rib as well.   Electronically Signed   By: Jacqulynn Cadet M.D.   On: 09/28/2013 01:25   09/28/2013   CLINICAL DATA:  Fall, left rib pain  EXAM: CT ABDOMEN AND PELVIS WITH CONTRAST  TECHNIQUE: Multidetector CT imaging of the abdomen and pelvis was performed using the standard protocol following bolus administration of intravenous contrast.  CONTRAST:  194m OMNIPAQUE IOHEXOL 300 MG/ML  SOLN  COMPARISON:  None.  FINDINGS: Lower Chest: Extensive patient and respiratory motion limits evaluation for small pulmonary nodules. Dependent atelectasis is noted in the bilateral lower lobes. The heart is within normal limits for size. No pericardial effusion. Coronary artery calcifications are present. Small left pleural effusion.  Abdomen: Unremarkable CT appearance of the stomach, duodenum, spleen, adrenal glands, and pancreas. Punctate calcification in the right hepatic lobe suggest old granulomatous disease. No focal hepatic lesion or abnormality. Gallbladder is unremarkable. No intra or extrahepatic biliary ductal dilatation. Calcified porta hepatis node.  Unremarkable appearance of the bilateral kidneys. No focal solid lesion, hydronephrosis or nephrolithiasis. Subcentimeter low-attenuation lesions bilaterally are too small to characterize but statistically highly likely benign cysts.  No evidence of obstruction or focal bowel wall thickening. Normal appendix in the right lower quadrant. The terminal ileum is unremarkable. No free fluid or suspicious adenopathy.  Pelvis: The bladder is distended with urine. Otherwise, unremarkable male pelvis. No free fluid or suspicious adenopathy.  Bones/Soft Tissues: Numerous left-sided rib and transverse process fractures. Segmental left tenth rib fracture  with nondisplaced fractures laterally and posteriorly rib as well as a nondisplaced fracture through the left transverse process of T10. Segmental ninth rib fracture with nondisplaced fracture laterally  and displaced fracture posteriorly. Additionally, the neck of the rib is fractured as a is the left transverse process of T9. Displaced fracture through the posterior aspect of the eighth rib with a nondisplaced fracture of the left transverse process of T8. Segmental left seventh rib fracture with nondisplaced anterior fracture and nondisplaced posterior rib fracture. Nondisplaced fracture through the left transverse process of T7. Segmental left sixth rib fracture. T6 is incompletely imaged. Minimally displaced fracture of the left transverse process of L1. There appears to be nondisplaced fracture through a prominent anterior osteophyte at T9 extending into the T9 vertebral body. No evidence of vertebral body height loss.  Vascular: Atherosclerotic vascular disease without significant stenosis or aneurysmal dilatation.  IMPRESSION: 1. Incompletely imaged multifocal trauma to the left hemi thorax including segmental fractures of the left sixth through tenth ribs, nondisplaced fractures of the left transverse processes of T7 through T10 and a probable nondisplaced fracture through the inferior aspect of the T9 vertebral body without evidence of significant height loss. Consider dedicated chest CT for complete evaluation. 2. Small left pleural effusion and associated left basilar atelectasis. No definite pneumothorax in the visualized portion of the lung. 3. Nondisplaced fracture through the left transverse process of L1. 4. No definite acute injury in the abdomen or pelvis. 5. Atherosclerosis including coronary artery disease.  Electronically Signed: By: Jacqulynn Cadet M.D. On: 09/28/2013 00:58   Dg Pelvis Portable  09/27/2013   CLINICAL DATA:  Fall.  Left hip pain.  EXAM: PORTABLE PELVIS 1-2 VIEWS   COMPARISON:  None.  FINDINGS: Evaluation is mildly limited by external support equipment/ backboard. No acute fracture is identified. The femoral heads are approximated with the acetabula on this single frontal projection. Hip joint spaces are preserved. No focal lytic or blastic osseous lesion is seen. Degenerative disc disease is noted in the visualized lower lumbar spine.  IMPRESSION: No acute osseous abnormality identified.   Electronically Signed   By: Logan Bores   On: 09/27/2013 23:51   Dg Chest Port 1 View  09/29/2013   CLINICAL DATA:  Trauma with multiple left rib fractures.  EXAM: PORTABLE CHEST - 1 VIEW  COMPARISON:  CT T SPINE W/O CM dated 09/28/2013; DG RIBS UNILATERAL*L* dated 09/28/2013; DG CHEST 1V PORT dated 09/27/2013  FINDINGS: Multiple displaced left-sided rib fractures are again noted. There is no evidence of pneumothorax or hemothorax. No focal consolidation is seen. No edema is identified. Cardiac and mediastinal contours are stable and within normal limits.  IMPRESSION: No pneumothorax or hemothorax identified.   Electronically Signed   By: Aletta Edouard M.D.   On: 09/29/2013 08:35   Dg Chest Port 1 View  09/27/2013   CLINICAL DATA:  Fall, trauma.  Left lower rib pain  EXAM: PORTABLE CHEST - 1 VIEW  COMPARISON:  Prior chest x-ray 10/29/2003  FINDINGS: Limited single frontal view of the chest with metallic artifact projecting over the image. Stable cardiac and mediastinal contours given differences in portable AP technique. No pneumothorax or pleural effusion. The lungs are clear. At least 2 left-sided rib fractures are identified. There are acute minimally displaced fractures of the antral lateral aspect of left ribs 6 and 7.  IMPRESSION: Acute minimally displaced fractures of the anterolateral aspects of left ribs 6 and 7. No pneumothorax identified.  Of note, left ribs 8 and 9 are incompletely imaged.   Electronically Signed   By: Jacqulynn Cadet M.D.   On: 09/27/2013 23:52     Review of systems not  obtained due to patient factors. Blood pressure 121/58, pulse 119, temperature 98.5 F (36.9 C), temperature source Oral, resp. rate 22, height '5\' 11"'  (1.803 m), weight 90.719 kg (200 lb), SpO2 96.00%. Awake, alert, conversant. No neuro deficits. Follows complex commandswith good strength.  Assessment/Plan: CT reviewed. Shows T9 body fracture without extension into middle or posterior elements. Also shows the transverse process fractures. These are all stable fractures and should heal with TLSO brace. In adddition, I reviewed his c spine ct scan, and it is negative for acute issues, so he can d/c his cervical collar. No further follow needed as in patient, but I will be happy to see him and follow him after d/c.  Faythe Ghee, MD 09/29/2013, 8:51 AM

## 2013-09-29 NOTE — Progress Notes (Signed)
C Collar d/c per Dr Gerlene FeeKritzer. Pt denies neck pain. Trauma NP updated c-collar d/c per neurosurgery. Koren BoundWaggoner, Ilynn Stauffer

## 2013-09-29 NOTE — Progress Notes (Signed)
I have seen and examined the pt and agree with PA-Riebock's progress note. Con't pulm toilet and pain control PT as per NSR with TLSO OK for SDU

## 2013-09-29 NOTE — Evaluation (Signed)
Physical Therapy Evaluation Patient Details Name: Philip Ford MRN: [id number] DOB: 03/20/1946 Today's Date: 09/29/2013 Time: 4098-1191 PT Time Calculation (min): 30 min  PT Assessment / Plan / Recommendation History of Present Illness  The patient is 68 yo male involved in fall down stairs. Came to [facility] where work up showed multiple rib fractures, T9 fracture, and multiple transverse process fractures. Neurosurgical input requested  Clinical Impression  Pt admitted with the above resultant fx's from a fall.  Pt currently limited functionally due to the problems listed below.  (see problems list.)  Pt will benefit from PT to maximize function and safety to be able to get home safely with available assist from family.     PT Assessment  Patient needs continued PT services    Follow Up Recommendations  Home health PT;Other (comment) (up to 24 hour assist initially then prn)    Does the patient have the potential to tolerate intense rehabilitation      Barriers to Discharge        Equipment Recommendations  Other (comment) (TBA)    Recommendations for Other Services     Frequency Min 5X/week    Precautions / Restrictions Precautions Precautions: Fall;Back Required Braces or Orthoses: Spinal Brace Spinal Brace: Thoracolumbosacral orthotic   Pertinent Vitals/Pain HR at rest low 120's, sats on 6L [place] mid to upper 90's;  With exertion, sats in low 90's, EHR  140's      Mobility  Bed Mobility Overal bed mobility: Needs Assistance;+2 for physical assistance Bed Mobility: Rolling;Sidelying to Sit Rolling: Max assist;+2 for safety/equipment Sidelying to sit: Max assist;+2 for safety/equipment General bed mobility comments: cuing for technique, truncal assist Transfers Overall transfer level: Needs assistance Transfers: Sit to/from Stand;Stand Pivot Transfers Sit to Stand: Min assist;+2 safety/equipment Stand pivot transfers: Min assist;+2 safety/equipment General transfer  comment: stability assist    Exercises     PT Diagnosis: Acute pain  PT Problem List: Decreased activity tolerance;Decreased strength;Decreased mobility;Decreased knowledge of precautions;Pain PT Treatment Interventions: Gait training;DME instruction;Functional mobility training;Therapeutic activities;Patient/family education     PT Goals(Current goals can be found in the care plan section) Acute Rehab PT Goals Patient Stated Goal: home independent so wife can work PT Goal Formulation: With patient Time For Goal Achievement: 10/06/13 Potential to Achieve Goals: Good  Visit Information  Last PT Received On: 09/29/13 Assistance Needed: +2 History of Present Illness: The patient is 68 yo male involved in fall down stairs. Came to [facility] where work up showed multiple rib fractures, T9 fracture, and multiple transverse process fractures. Neurosurgical input requested       Prior Functioning  Home Living Family/patient expects to be discharged to:: Private residence Living Arrangements: Spouse/significant other Available Help at Discharge: Family;Other (Comment) (wife works, how much she can assist TBA) Type of Home: House Home Access: Stairs to enter Secretary/administrator of Steps: 1 Home Layout: Two level Alternate Level Stairs-Number of Steps: flight Alternate Level Stairs-Rails: Right;Left Home Equipment: Other (comment) (pt thinks he has RW BSC, and tub seat) Prior Function Level of Independence: Independent Communication Communication: No difficulties    Cognition  Cognition Arousal/Alertness: Awake/alert Behavior During Therapy: WFL for tasks assessed/performed Overall Cognitive Status: Within Functional Limits for tasks assessed    Extremity/Trunk Assessment Upper Extremity Assessment Upper Extremity Assessment: Generalized weakness Lower Extremity Assessment Lower Extremity Assessment: Generalized weakness (mostly due to pain)   Balance Balance Overall balance  assessment: No apparent balance deficits (not formally assessed)  End of Session PT -  End of Session Equipment Utilized During Treatment: Back brace;Oxygen Activity Tolerance: Patient tolerated treatment well;Patient limited by pain Patient left: in chair;with call bell/phone within reach;with nursing/sitter in room Nurse Communication: Mobility status;Precautions  GP     Savier Trickett, Eliseo GumKenneth V 09/29/2013, 11:54 AM 09/29/2013  [place] BingKen Tifanie Gardiner, PT [phone]  (pager)

## 2013-09-29 NOTE — Progress Notes (Signed)
Patient ID: Philip Ford, male   DOB: 1945/11/28, 68 y.o.   MRN: [id number]  LOS: 2 days   Subjective: Pain not well controlled.  No cp, sob.  Has not been OOB.   Objective: Vital signs in last 24 hours: Temp:  [97.9 F (36.6 C)-100.5 F (38.1 C)] 98.5 F (36.9 C) (02/15 0736) Pulse Rate:  [92-125] 119 (02/15 0800) Resp:  [10-27] 22 (02/15 0844) BP: (116-189)/(52-106) 121/58 mmHg (02/15 0800) SpO2:  [2 %-98 %] 96 % (02/15 0844) Last BM Date: 09/27/13  Lab Results:  CBC  Recent Labs  09/27/13 2340 09/27/13 2357 09/29/13 0308  WBC 24.8*  --  13.9*  HGB 13.7 14.6 11.8*  HCT 39.5 43.0 35.0*  PLT 206  --  136*   BMET  Recent Labs  09/27/13 2340 09/27/13 2357 09/29/13 0308  NA 140 144 141  K 3.7 3.5* 4.0  CL 100 103 102  CO2 19  --  28  GLUCOSE 159* 161* 161*  BUN 13 12 12   CREATININE 0.90 1.20 0.85  CALCIUM 8.1*  --  7.9*    Imaging: Dg Ribs Unilateral Left  09/28/2013   CLINICAL DATA:  Left rib pain, fell down stairs  EXAM: LEFT RIBS - 2 VIEW  COMPARISON:  CT scan of the abdomen and pelvis performed earlier today  FINDINGS: Multiple left-sided rib fractures including ribs 3-11. No pneumothorax. Low inspiratory volumes with mild left basilar atelectasis.  IMPRESSION: Numerous left-sided rib fractures including ribs 3, 4, 5, 6, 7, 8, 9, 10 and 11. Many of the fractures are segmental.   Electronically Signed   By: Malachy Moan M.D.   On: 09/28/2013 01:25   Ct Head Wo Contrast  09/28/2013   CLINICAL DATA:  Fall  EXAM: CT HEAD WITHOUT CONTRAST  CT CERVICAL SPINE WITHOUT CONTRAST  TECHNIQUE: Multidetector CT imaging of the head and cervical spine was performed following the standard protocol without intravenous contrast. Multiplanar CT image reconstructions of the cervical spine were also generated.  COMPARISON:  None.  FINDINGS: CT HEAD FINDINGS  Focal area of Encephalomalacia within the left frontal lobe is compatible with remote infarct. Additional scattered hypo  intense foci within the periventricular and deep white matter compatible with mild chronic microvascular ischemic changes. There is no acute intracranial hemorrhage or infarct. No mass lesion or midline shift. Gray-white matter differentiation is well maintained. Ventricles are normal in size without evidence of hydrocephalus. CSF containing spaces are within normal limits. No extra-axial fluid collection.  The calvarium is intact.  Orbital soft tissues are within normal limits.  Mild polypoid opacity present within the right maxillary sinus. There is mild mucoperiosteal thickening within the ethmoidal air cells and frontal sinuses. No mastoid effusion.  Left parietal scalp contusion present with laceration. No radiopaque foreign body.  CT CERVICAL SPINE FINDINGS  There is straightening of the normal cervical lordosis. Trace retrolisthesis of C5 on C6 is present. Vertebral body heights are preserved. Normal C1-2 articulations are intact. No prevertebral soft tissue swelling.  No acute fracture or listhesis.  Moderate degenerative disc disease as evidenced by intervertebral disc space narrowing and endplate osteophytosis is present at C5-6. Additional mild multilevel degenerative disc disease seen throughout the cervical spine.  Visualized soft tissues of the neck are within normal limits. Atherosclerotic calcifications noted about the carotid bifurcations. Scattered atherosclerotic plaque also noted within the proximal right vertebral artery.  IMPRESSION: CT BRAIN:  1. Left parietal scalp contusion/laceration. No acute intracranial process. 2. Remote left frontal  lobe infarct. 3. Mild chronic microvascular ischemic changes.  CT CERVICAL SPINE:  1. No acute traumatic injury within the cervical spine. 2. Moderate degenerative disc disease as above, most severe at C5-6.   Electronically Signed   By: Rise Mu M.D.   On: 09/28/2013 00:39   Ct Cervical Spine Wo Contrast  09/28/2013   CLINICAL DATA:  Fall   EXAM: CT HEAD WITHOUT CONTRAST  CT CERVICAL SPINE WITHOUT CONTRAST  TECHNIQUE: Multidetector CT imaging of the head and cervical spine was performed following the standard protocol without intravenous contrast. Multiplanar CT image reconstructions of the cervical spine were also generated.  COMPARISON:  None.  FINDINGS: CT HEAD FINDINGS  Focal area of Encephalomalacia within the left frontal lobe is compatible with remote infarct. Additional scattered hypo intense foci within the periventricular and deep white matter compatible with mild chronic microvascular ischemic changes. There is no acute intracranial hemorrhage or infarct. No mass lesion or midline shift. Gray-white matter differentiation is well maintained. Ventricles are normal in size without evidence of hydrocephalus. CSF containing spaces are within normal limits. No extra-axial fluid collection.  The calvarium is intact.  Orbital soft tissues are within normal limits.  Mild polypoid opacity present within the right maxillary sinus. There is mild mucoperiosteal thickening within the ethmoidal air cells and frontal sinuses. No mastoid effusion.  Left parietal scalp contusion present with laceration. No radiopaque foreign body.  CT CERVICAL SPINE FINDINGS  There is straightening of the normal cervical lordosis. Trace retrolisthesis of C5 on C6 is present. Vertebral body heights are preserved. Normal C1-2 articulations are intact. No prevertebral soft tissue swelling.  No acute fracture or listhesis.  Moderate degenerative disc disease as evidenced by intervertebral disc space narrowing and endplate osteophytosis is present at C5-6. Additional mild multilevel degenerative disc disease seen throughout the cervical spine.  Visualized soft tissues of the neck are within normal limits. Atherosclerotic calcifications noted about the carotid bifurcations. Scattered atherosclerotic plaque also noted within the proximal right vertebral artery.  IMPRESSION: CT  BRAIN:  1. Left parietal scalp contusion/laceration. No acute intracranial process. 2. Remote left frontal lobe infarct. 3. Mild chronic microvascular ischemic changes.  CT CERVICAL SPINE:  1. No acute traumatic injury within the cervical spine. 2. Moderate degenerative disc disease as above, most severe at C5-6.   Electronically Signed   By: Rise Mu M.D.   On: 09/28/2013 00:39   Ct Thoracic Spine Wo Contrast  09/28/2013   CLINICAL DATA:  Fall.  Left rib pain.  Evaluate for spinal fracture.  EXAM: CT THORACIC SPINE WITHOUT CONTRAST  TECHNIQUE: Multidetector CT imaging of the thoracic spine was performed without intravenous contrast administration. Multiplanar CT image reconstructions were also generated.  COMPARISON:  Rib radiographs 09/28/2013. CT abdomen and pelvis 09/28/2013.  FINDINGS: Thoracic vertebral alignment is normal. Multilevel bridging osteophytosis is present. Vertebral body heights are preserved without evidence of compression fracture. There is a nondisplaced fracture involving a right-sided osteophyte at T9 which also involves the right lateral and anterior portions of the vertebral body without height loss or definite comminution. The fracture does not clearly extend into the posterior half of the vertebral body, and no involvement of the posterior elements is identified. There are nondisplaced left T7 through L2 transverse process fractures. There are fractures through the posterior left fourth, fifth, ninth, and tenth ribs. Additional rib fractures described on recent abdomen pelvis CT were not included on this study.  Dependent subsegmental atelectasis is present within the visualized portions of  both lungs. Excreted IV contrast is partially visualized in the renal collecting systems. 9 mm left upper pole renal hypodensity is too small to fully characterize but most likely represents a cyst. Moderate aortic atherosclerosis is partially visualized.  IMPRESSION: 1. Nondisplaced  fracture involving a right-sided osteophyte at T9 with extension into the right lateral and anterior portions of the T9 vertebral body. No height loss or involvement of the middle or posterior columns. 2. Fractures of the left T7 through L2 transverse processes and multiple left-sided ribs.   Electronically Signed   By: Sebastian AcheAllen  Grady   On: 09/28/2013 04:13   Ct Abdomen Pelvis W Contrast  09/28/2013   ADDENDUM REPORT: 09/28/2013 01:25  ADDENDUM: Nondisplaced fracture through the posterior aspect of the left eleventh rib as well.   Electronically Signed   By: Malachy MoanHeath  McCullough M.D.   On: 09/28/2013 01:25   09/28/2013   CLINICAL DATA:  Fall, left rib pain  EXAM: CT ABDOMEN AND PELVIS WITH CONTRAST  TECHNIQUE: Multidetector CT imaging of the abdomen and pelvis was performed using the standard protocol following bolus administration of intravenous contrast.  CONTRAST:  100mL OMNIPAQUE IOHEXOL 300 MG/ML  SOLN  COMPARISON:  None.  FINDINGS: Lower Chest: Extensive patient and respiratory motion limits evaluation for small pulmonary nodules. Dependent atelectasis is noted in the bilateral lower lobes. The heart is within normal limits for size. No pericardial effusion. Coronary artery calcifications are present. Small left pleural effusion.  Abdomen: Unremarkable CT appearance of the stomach, duodenum, spleen, adrenal glands, and pancreas. Punctate calcification in the right hepatic lobe suggest old granulomatous disease. No focal hepatic lesion or abnormality. Gallbladder is unremarkable. No intra or extrahepatic biliary ductal dilatation. Calcified porta hepatis node.  Unremarkable appearance of the bilateral kidneys. No focal solid lesion, hydronephrosis or nephrolithiasis. Subcentimeter low-attenuation lesions bilaterally are too small to characterize but statistically highly likely benign cysts.  No evidence of obstruction or focal bowel wall thickening. Normal appendix in the right lower quadrant. The terminal ileum  is unremarkable. No free fluid or suspicious adenopathy.  Pelvis: The bladder is distended with urine. Otherwise, unremarkable male pelvis. No free fluid or suspicious adenopathy.  Bones/Soft Tissues: Numerous left-sided rib and transverse process fractures. Segmental left tenth rib fracture with nondisplaced fractures laterally and posteriorly rib as well as a nondisplaced fracture through the left transverse process of T10. Segmental ninth rib fracture with nondisplaced fracture laterally and displaced fracture posteriorly. Additionally, the neck of the rib is fractured as a is the left transverse process of T9. Displaced fracture through the posterior aspect of the eighth rib with a nondisplaced fracture of the left transverse process of T8. Segmental left seventh rib fracture with nondisplaced anterior fracture and nondisplaced posterior rib fracture. Nondisplaced fracture through the left transverse process of T7. Segmental left sixth rib fracture. T6 is incompletely imaged. Minimally displaced fracture of the left transverse process of L1. There appears to be nondisplaced fracture through a prominent anterior osteophyte at T9 extending into the T9 vertebral body. No evidence of vertebral body height loss.  Vascular: Atherosclerotic vascular disease without significant stenosis or aneurysmal dilatation.  IMPRESSION: 1. Incompletely imaged multifocal trauma to the left hemi thorax including segmental fractures of the left sixth through tenth ribs, nondisplaced fractures of the left transverse processes of T7 through T10 and a probable nondisplaced fracture through the inferior aspect of the T9 vertebral body without evidence of significant height loss. Consider dedicated chest CT for complete evaluation. 2. Small left pleural effusion  and associated left basilar atelectasis. No definite pneumothorax in the visualized portion of the lung. 3. Nondisplaced fracture through the left transverse process of L1. 4. No  definite acute injury in the abdomen or pelvis. 5. Atherosclerosis including coronary artery disease.  Electronically Signed: By: Malachy Moan M.D. On: 09/28/2013 00:58   Dg Pelvis Portable  09/27/2013   CLINICAL DATA:  Fall.  Left hip pain.  EXAM: PORTABLE PELVIS 1-2 VIEWS  COMPARISON:  None.  FINDINGS: Evaluation is mildly limited by external support equipment/ backboard. No acute fracture is identified. The femoral heads are approximated with the acetabula on this single frontal projection. Hip joint spaces are preserved. No focal lytic or blastic osseous lesion is seen. Degenerative disc disease is noted in the visualized lower lumbar spine.  IMPRESSION: No acute osseous abnormality identified.   Electronically Signed   By: Sebastian Ache   On: 09/27/2013 23:51   Dg Chest Port 1 View  09/29/2013   CLINICAL DATA:  Trauma with multiple left rib fractures.  EXAM: PORTABLE CHEST - 1 VIEW  COMPARISON:  CT T SPINE W/O CM dated 09/28/2013; DG RIBS UNILATERAL*L* dated 09/28/2013; DG CHEST 1V PORT dated 09/27/2013  FINDINGS: Multiple displaced left-sided rib fractures are again noted. There is no evidence of pneumothorax or hemothorax. No focal consolidation is seen. No edema is identified. Cardiac and mediastinal contours are stable and within normal limits.  IMPRESSION: No pneumothorax or hemothorax identified.   Electronically Signed   By: Irish Lack M.D.   On: 09/29/2013 08:35   Dg Chest Port 1 View  09/27/2013   CLINICAL DATA:  Fall, trauma.  Left lower rib pain  EXAM: PORTABLE CHEST - 1 VIEW  COMPARISON:  Prior chest x-ray 10/29/2003  FINDINGS: Limited single frontal view of the chest with metallic artifact projecting over the image. Stable cardiac and mediastinal contours given differences in portable AP technique. No pneumothorax or pleural effusion. The lungs are clear. At least 2 left-sided rib fractures are identified. There are acute minimally displaced fractures of the antral lateral aspect of  left ribs 6 and 7.  IMPRESSION: Acute minimally displaced fractures of the anterolateral aspects of left ribs 6 and 7. No pneumothorax identified.  Of note, left ribs 8 and 9 are incompletely imaged.   Electronically Signed   By: Malachy Moan M.D.   On: 09/27/2013 23:52   PE:  General appearance: alert, cooperative and no distress  Head: posterior scalp laceration, closed and without active bleeding.  Neck:cleared Resp: clear to auscultation bilaterally  Cardio: regular rate and rhythm, S1, S2 normal, no murmur, click, rub or gallop. tachy  GI: soft, non-tender; bowel sounds normal; no masses, no organomegaly  Extremities: multiple abrasions to extremities.  Neurologic: Grossly normal   Patient Active Problem List   Diagnosis Date Noted  . Fall 09/28/2013  . Trauma 09/28/2013   Assessment/Plan:  GLF  L 3-11 rib fx- CXR stable this AM, no PTX. He is a 1/2ppd smoker. He will need aggressive pulmonary toilet. Add oral pain meds, continue PCA for today, monitor for oversedation.   C-spine cleared clinically  L TP fx T7-10  T9 vertebral body fx  L1 TP fx  -stable, TLSO brace with activity, PT eval to start mobilizing  Posterior head lac-closed by EDP, will need staples out in 5 days.  Multiple abrasions-local care  Alcohol abuse-CIWA protocol, vitamins  VTE - SCD's, Lovenox  ABL anemia-down today, repeat in AM FEN -DC IVF, hypokalemia supplemented, repeat in  AM.  CBGs are high, monitor, a1c in AM. HTN-resume home meds, PRN metoprolol.  i suspect tachycardia is from pain. May need to change metoprolol to scheduled.  He denies chest pains, but has a known history of CAD Dispo -- transfer to SDU, having tachycardia  Ashok Norris, ANP-BC Pager: [phone] General Trauma PA Pager: [phone]   09/29/2013 9:13 AM

## 2013-09-29 NOTE — Plan of Care (Signed)
Problem: Phase II Progression Outcomes Goal: Progress activity as tolerated unless otherwise ordered Outcome: Progressing PT consult, beginning OOB to chair/ambulation

## 2013-09-29 NOTE — Plan of Care (Signed)
Problem: Problem: Respiratory Progression Goal: ABLE TO WEAN TO ROOM AIR Outcome: Not Progressing Increased oxygen needs Goal: ABLE TO AMBULATE ON ROOM AIR Outcome: Not Progressing Waiting on back brace

## 2013-09-29 NOTE — Procedures (Signed)
Asked by Dr Derrell Lollingamirez  to emergently intubate patient for Hypoxemia after rib fractues. Chart reviewed, patient identified, and time-out performed. Propofol 80mg , Succinyl Choline 120mg , Lidocaine 100gm administered intravenously.  Larygoscopy performed by R. Foley CRNA using a Macintosh#4 blade and a number 8 subglottic suction tube, taped at 23cm at the lip.  Good bilateral breath sounds heard, ETCO2 positive.  Airway to RT, sedation per attending physician. Start: 945pm End: 10:00pm Arta BruceKevin Trason Shifflet, MD

## 2013-09-30 ENCOUNTER — Inpatient Hospital Stay (HOSPITAL_COMMUNITY): Payer: Medicare Other

## 2013-09-30 DIAGNOSIS — T07XXXA Unspecified multiple injuries, initial encounter: Secondary | ICD-10-CM | POA: Diagnosis present

## 2013-09-30 DIAGNOSIS — S2242XA Multiple fractures of ribs, left side, initial encounter for closed fracture: Secondary | ICD-10-CM | POA: Diagnosis present

## 2013-09-30 DIAGNOSIS — D62 Acute posthemorrhagic anemia: Secondary | ICD-10-CM | POA: Diagnosis not present

## 2013-09-30 DIAGNOSIS — S22079A Unspecified fracture of T9-T10 vertebra, initial encounter for closed fracture: Secondary | ICD-10-CM | POA: Diagnosis present

## 2013-09-30 DIAGNOSIS — F1010 Alcohol abuse, uncomplicated: Secondary | ICD-10-CM | POA: Diagnosis present

## 2013-09-30 DIAGNOSIS — J9600 Acute respiratory failure, unspecified whether with hypoxia or hypercapnia: Secondary | ICD-10-CM | POA: Diagnosis not present

## 2013-09-30 DIAGNOSIS — S0101XA Laceration without foreign body of scalp, initial encounter: Secondary | ICD-10-CM | POA: Diagnosis present

## 2013-09-30 DIAGNOSIS — S22009A Unspecified fracture of unspecified thoracic vertebra, initial encounter for closed fracture: Secondary | ICD-10-CM | POA: Diagnosis present

## 2013-09-30 DIAGNOSIS — S32009A Unspecified fracture of unspecified lumbar vertebra, initial encounter for closed fracture: Secondary | ICD-10-CM | POA: Diagnosis present

## 2013-09-30 DIAGNOSIS — I10 Essential (primary) hypertension: Secondary | ICD-10-CM | POA: Diagnosis present

## 2013-09-30 DIAGNOSIS — J95821 Acute postprocedural respiratory failure: Secondary | ICD-10-CM

## 2013-09-30 LAB — GLUCOSE, CAPILLARY
Glucose-Capillary: 143 mg/dL — ABNORMAL HIGH (ref 70–99)
Glucose-Capillary: 151 mg/dL — ABNORMAL HIGH (ref 70–99)
Glucose-Capillary: 151 mg/dL — ABNORMAL HIGH (ref 70–99)
Glucose-Capillary: 151 mg/dL — ABNORMAL HIGH (ref 70–99)
Glucose-Capillary: 127 mg/dL — ABNORMAL HIGH (ref 70–99)

## 2013-09-30 LAB — BASIC METABOLIC PANEL
BUN: 16 mg/dL (ref 6–23)
CO2: 26 mEq/L (ref 19–32)
Calcium: 8 mg/dL — ABNORMAL LOW (ref 8.4–10.5)
Chloride: 99 mEq/L (ref 96–112)
Creatinine, Ser: 1.13 mg/dL (ref 0.50–1.35)
GFR calc Af Amer: 76 mL/min — ABNORMAL LOW (ref >90)
GFR calc non Af Amer: 66 mL/min — ABNORMAL LOW (ref >90)
Glucose, Bld: 143 mg/dL — ABNORMAL HIGH (ref 70–99)
Potassium: 3.8 mEq/L (ref 3.7–5.3)
Sodium: 136 mEq/L — ABNORMAL LOW (ref 137–147)

## 2013-09-30 LAB — POCT I-STAT 3, ART BLOOD GAS (G3+)
Acid-Base Excess: 4 mmol/L — ABNORMAL HIGH (ref 0.0–2.0)
Acid-Base Excess: 6 mmol/L — ABNORMAL HIGH (ref 0.0–2.0)
Bicarbonate: 30.4 mEq/L — ABNORMAL HIGH (ref 20.0–24.0)
Bicarbonate: 31.9 mEq/L — ABNORMAL HIGH (ref 20.0–24.0)
O2 Saturation: 97 %
O2 Saturation: 94 %
Patient temperature: 99.7
Patient temperature: 98.6
TCO2: 32 mmol/L (ref 0–100)
TCO2: 33 mmol/L (ref 0–100)
pCO2 arterial: 56.9 mmHg — ABNORMAL HIGH (ref 35.0–45.0)
pCO2 arterial: 52.8 mmHg — ABNORMAL HIGH (ref 35.0–45.0)
pH, Arterial: 7.339 — ABNORMAL LOW (ref 7.350–7.450)
pH, Arterial: 7.389 (ref 7.350–7.450)
pO2, Arterial: 104 mmHg — ABNORMAL HIGH (ref 80.0–100.0)
pO2, Arterial: 74 mmHg — ABNORMAL LOW (ref 80.0–100.0)

## 2013-09-30 LAB — CBC
HCT: 30.6 % — ABNORMAL LOW (ref 39.0–52.0)
Hemoglobin: 10.3 g/dL — ABNORMAL LOW (ref 13.0–17.0)
MCH: 32.7 pg (ref 26.0–34.0)
MCHC: 33.7 g/dL (ref 30.0–36.0)
MCV: 97.1 fL (ref 78.0–100.0)
Platelets: 122 10*3/uL — ABNORMAL LOW (ref 150–400)
RBC: 3.15 MIL/uL — ABNORMAL LOW (ref 4.22–5.81)
RDW: 12.8 % (ref 11.5–15.5)
WBC: 11.8 10*3/uL — ABNORMAL HIGH (ref 4.0–10.5)

## 2013-09-30 LAB — HEMOGLOBIN A1C
Hgb A1c MFr Bld: 6.4 % — ABNORMAL HIGH (ref <5.7)
Mean Plasma Glucose: 137 mg/dL — ABNORMAL HIGH (ref <117)

## 2013-09-30 LAB — MAGNESIUM: Magnesium: 1.6 mg/dL (ref 1.5–2.5)

## 2013-09-30 MED ORDER — SODIUM CHLORIDE 0.9 % IJ SOLN
10.0000 mL | Freq: Two times a day (BID) | INTRAMUSCULAR | Status: DC
  Administered 2013-09-30: 20 mL
  Administered 2013-09-30 – 2013-10-01 (×2): 10 mL
  Administered 2013-10-02: 20 mL
  Administered 2013-10-03: 10 mL
  Administered 2013-10-03 – 2013-10-04 (×2): 20 mL
  Administered 2013-10-04: 10 mL
  Administered 2013-10-05: 40 mL
  Administered 2013-10-06: 20 mL

## 2013-09-30 MED ORDER — VITAMIN C 500 MG PO TABS
1000.0000 mg | ORAL_TABLET | Freq: Three times a day (TID) | ORAL | Status: DC
  Administered 2013-09-30 – 2013-10-05 (×14): 1000 mg
  Filled 2013-09-30 (×19): qty 2

## 2013-09-30 MED ORDER — PIVOT 1.5 CAL PO LIQD
1000.0000 mL | ORAL | Status: DC
  Filled 2013-09-30 (×2): qty 1000

## 2013-09-30 MED ORDER — PANTOPRAZOLE SODIUM 40 MG PO PACK
40.0000 mg | PACK | Freq: Every day | ORAL | Status: DC
  Administered 2013-09-30 – 2013-10-02 (×3): 40 mg
  Filled 2013-09-30 (×4): qty 20

## 2013-09-30 MED ORDER — ACETAMINOPHEN 160 MG/5ML PO SOLN: 650.0000 mg | ORAL | Status: DC | PRN

## 2013-09-30 MED ORDER — ADULT MULTIVITAMIN W/MINERALS CH
1.0000 | ORAL_TABLET | Freq: Every day | ORAL | Status: DC
  Administered 2013-09-30 – 2013-10-06 (×7): 1
  Filled 2013-09-30 (×7): qty 1

## 2013-09-30 MED ORDER — SODIUM CHLORIDE 0.9 % IJ SOLN
10.0000 mL | INTRAMUSCULAR | Status: DC | PRN
  Administered 2013-10-01 – 2013-10-02 (×3): 10 mL
  Administered 2013-10-02: 20 mL
  Administered 2013-10-03 – 2013-10-04 (×2): 10 mL

## 2013-09-30 MED ORDER — VANCOMYCIN HCL IN DEXTROSE 1-5 GM/200ML-% IV SOLN
1000.0000 mg | Freq: Once | INTRAVENOUS | Status: AC
  Administered 2013-09-30: 1000 mg via INTRAVENOUS
  Filled 2013-09-30: qty 200

## 2013-09-30 MED ORDER — PROPOFOL 10 MG/ML IV EMUL
5.0000 ug/kg/min | INTRAVENOUS | Status: DC
Start: 2013-09-30 — End: 2013-10-05

## 2013-09-30 MED ORDER — INSULIN ASPART 100 UNIT/ML ~~LOC~~ SOLN
0.0000 [IU] | SUBCUTANEOUS | Status: DC
  Administered 2013-09-30 (×3): 3 [IU] via SUBCUTANEOUS
  Administered 2013-09-30 (×2): 2 [IU] via SUBCUTANEOUS
  Administered 2013-10-01: 3 [IU] via SUBCUTANEOUS
  Administered 2013-10-01 (×2): 2 [IU] via SUBCUTANEOUS
  Administered 2013-10-01: 3 [IU] via SUBCUTANEOUS
  Administered 2013-10-01: 2 [IU] via SUBCUTANEOUS
  Administered 2013-10-01 – 2013-10-02 (×2): 3 [IU] via SUBCUTANEOUS
  Administered 2013-10-02: 2 [IU] via SUBCUTANEOUS
  Administered 2013-10-02 (×2): 3 [IU] via SUBCUTANEOUS
  Administered 2013-10-03 – 2013-10-04 (×2): 2 [IU] via SUBCUTANEOUS
  Administered 2013-10-05: 3 [IU] via SUBCUTANEOUS

## 2013-09-30 MED ORDER — PIVOT 1.5 CAL PO LIQD
1000.0000 mL | ORAL | Status: DC
  Administered 2013-09-30 – 2013-10-01 (×2): 1000 mL
  Filled 2013-09-30 (×5): qty 1000

## 2013-09-30 MED ORDER — PRO-STAT SUGAR FREE PO LIQD
30.0000 mL | Freq: Two times a day (BID) | ORAL | Status: DC
  Administered 2013-09-30 – 2013-10-04 (×8): 30 mL
  Filled 2013-09-30 (×10): qty 30

## 2013-09-30 MED ORDER — VITAMIN E 15 UNIT/0.3ML PO SOLN
400.0000 [IU] | Freq: Three times a day (TID) | ORAL | Status: DC
  Administered 2013-09-30 – 2013-10-02 (×7): 400 [IU]
  Filled 2013-09-30 (×12): qty 8

## 2013-09-30 MED ORDER — PROPRANOLOL HCL 20 MG/5ML PO SOLN
20.0000 mg | Freq: Four times a day (QID) | ORAL | Status: DC
  Administered 2013-09-30 – 2013-10-03 (×12): 20 mg
  Filled 2013-09-30 (×17): qty 5

## 2013-09-30 MED ORDER — CHLORHEXIDINE GLUCONATE 0.12 % MT SOLN
15.0000 mL | Freq: Two times a day (BID) | OROMUCOSAL | Status: DC
  Administered 2013-09-29 – 2013-10-03 (×8): 15 mL via OROMUCOSAL
  Filled 2013-09-30 (×8): qty 15

## 2013-09-30 MED ORDER — SELENIUM 50 MCG PO TABS
200.0000 ug | ORAL_TABLET | Freq: Every day | ORAL | Status: AC
  Administered 2013-09-30 – 2013-10-06 (×7): 200 ug
  Filled 2013-09-30 (×7): qty 4

## 2013-09-30 MED ORDER — HYDROCHLOROTHIAZIDE 25 MG PO TABS
25.0000 mg | ORAL_TABLET | Freq: Every day | ORAL | Status: DC
  Administered 2013-09-30 – 2013-10-06 (×7): 25 mg
  Filled 2013-09-30 (×7): qty 1

## 2013-09-30 MED ORDER — PIPERACILLIN-TAZOBACTAM 3.375 G IVPB
3.3750 g | Freq: Three times a day (TID) | INTRAVENOUS | Status: DC
  Administered 2013-09-30 – 2013-10-05 (×16): 3.375 g via INTRAVENOUS
  Filled 2013-09-30 (×17): qty 50

## 2013-09-30 MED ORDER — VANCOMYCIN HCL IN DEXTROSE 1-5 GM/200ML-% IV SOLN
1000.0000 mg | Freq: Three times a day (TID) | INTRAVENOUS | Status: DC
  Administered 2013-09-30 – 2013-10-05 (×16): 1000 mg via INTRAVENOUS
  Filled 2013-09-30 (×18): qty 200

## 2013-09-30 MED ORDER — ENOXAPARIN SODIUM 30 MG/0.3ML ~~LOC~~ SOLN
30.0000 mg | SUBCUTANEOUS | Status: DC
  Administered 2013-09-30 – 2013-10-06 (×7): 30 mg via SUBCUTANEOUS
  Filled 2013-09-30 (×7): qty 0.3

## 2013-09-30 MED ORDER — PIPERACILLIN-TAZOBACTAM 3.375 G IVPB 30 MIN
3.3750 g | Freq: Once | INTRAVENOUS | Status: AC
  Administered 2013-09-30: 3.375 g via INTRAVENOUS
  Filled 2013-09-30: qty 50

## 2013-09-30 MED ORDER — FOLIC ACID 1 MG PO TABS
1.0000 mg | ORAL_TABLET | Freq: Every day | ORAL | Status: DC
  Administered 2013-09-30 – 2013-10-06 (×7): 1 mg
  Filled 2013-09-30 (×7): qty 1

## 2013-09-30 MED ORDER — PANTOPRAZOLE SODIUM 40 MG IV SOLR
40.0000 mg | INTRAVENOUS | Status: DC
  Administered 2013-09-30: 40 mg via INTRAVENOUS
  Filled 2013-09-30 (×3): qty 40

## 2013-09-30 MED ORDER — BIOTENE DRY MOUTH MT LIQD
15.0000 mL | Freq: Four times a day (QID) | OROMUCOSAL | Status: DC
  Administered 2013-09-30 – 2013-10-04 (×15): 15 mL via OROMUCOSAL

## 2013-09-30 NOTE — Progress Notes (Signed)
ANTIBIOTIC CONSULT NOTE - INITIAL  Pharmacy Consult for Vancocin and Zosyn Indication: rule out pneumonia  No Known Allergies  Patient Measurements: Height: 5\' 11"  (180.3 cm) Weight: 200 lb (90.719 kg) IBW/kg (Calculated) : 75.3  Vital Signs: Temp: 101.3 F (38.5 C) (02/16 0000) Temp src: Oral (02/15 2130) BP: 123/56 mmHg (02/16 0100) Pulse Rate: 114 (02/16 0100) Intake/Output from previous day: 02/15 0701 - 02/16 0700 In: 1345 [P.O.:540; I.V.:805] Out: 900 [Urine:900] Intake/Output from this shift: Total I/O In: 550 [I.V.:550] Out: 250 [Urine:250]  Labs:  Recent Labs  09/27/13 2340 09/27/13 2357 09/29/13 0308  WBC 24.8*  --  13.9*  HGB 13.7 14.6 11.8*  PLT 206  --  136*  CREATININE 0.90 1.20 0.85   Estimated Creatinine Clearance: 98.5 ml/min (by C-G formula based on Cr of 0.85).   Microbiology: Recent Results (from the past 720 hour(s))  MRSA PCR SCREENING     Status: None   Collection Time    09/28/13  4:05 AM      Result Value Ref Range Status   MRSA by PCR NEGATIVE  NEGATIVE Final   Comment:            The GeneXpert MRSA Assay (FDA     approved for NASAL specimens     only), is one component of a     comprehensive MRSA colonization     surveillance program. It is not     intended to diagnose MRSA     infection nor to guide or     monitor treatment for     MRSA infections.    Medical History: Past Medical History  Diagnosis Date  . Coronary artery disease   . Hypertension     Medications:  Prescriptions prior to admission  Medication Sig Dispense Refill  . aspirin EC 81 MG tablet Take 81 mg by mouth daily.      . hydrochlorothiazide (HYDRODIURIL) 25 MG tablet Take 1 tablet by mouth daily.      Marland Kitchen. lisinopril (PRINIVIL,ZESTRIL) 10 MG tablet Take 1 tablet by mouth daily.      Marland Kitchen. omeprazole (PRILOSEC) 20 MG capsule Take 20 mg by mouth daily.       Scheduled:  . antiseptic oral rinse  15 mL Mouth Rinse QID  . chlorhexidine  15 mL Mouth Rinse  BID  . enoxaparin (LOVENOX) injection  40 mg Subcutaneous Q24H  . folic acid  1 mg Oral Daily  . hydrochlorothiazide  25 mg Oral Daily  . HYDROmorphone PCA 0.3 mg/mL   Intravenous 6 times per day  . insulin aspart  0-15 Units Subcutaneous TID WC  . ipratropium-albuterol  3 mL Nebulization Q4H  . lisinopril  10 mg Oral Daily  . multivitamin with minerals  1 tablet Oral Daily  . neomycin-bacitracin-polymyxin  1 application Topical BID  . thiamine  100 mg Intravenous Daily   Infusions:  . sodium chloride 10 mL/hr (09/29/13 0932)    Assessment: 68yo male admitted 2/14 s/p mechanical fall w/ multiple fractures, now intubated for acute resp failute and poor mgmt of secretions, no evidence of acute infection but w/ left pleural effusion and fever, to begin IV ABX empirically.  Goal of Therapy: Vancomycin trough level 15-20 mcg/ml  Plan:  Will begin vancomycin 1g IV Q8H and Zosyn 3.375g IV Q8H and monitor CBC, Cx, levels prn.  Vernard GamblesVeronda Kriste Broman, PharmD, BCPS  09/30/2013,1:45 AM

## 2013-09-30 NOTE — Progress Notes (Signed)
I agree with the Student-Dietitian note and made appropriate revisions.  Katie Ivah Girardot, RD, LDN Pager #: [phone] After-Hours Pager #: [phone]  
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
## 2013-09-30 NOTE — Progress Notes (Signed)
I spoke at length with his wife and son. He only intermittantly drinks, but when he does, it can be a lot. Doing well on 5/5 now. Will check ABG at 1200 but likely keep on vent until tomorrow. Not a candidate for thoracic epdural due to t spine FXs. Patient examined and I agree with the assessment and plan  Violeta GelinasBurke Viola Placeres, MD, MPH, FACS Pager: [phone]  09/30/2013 10:11 AM

## 2013-09-30 NOTE — Progress Notes (Signed)
Dilaudid PCA syringe taken down. 7p RN stopped PCA over night. 12mL waste from syringe. Waste volume in sink. Eloise HarmanAlan Currin RN witness waste. Koren BoundWaggoner, Samule Life

## 2013-09-30 NOTE — Progress Notes (Signed)
Peripherally Inserted Central Catheter/Midline Placement  The IV Nurse has discussed with the patient and/or persons authorized to consent for the patient, the purpose of this procedure and the potential benefits and risks involved with this procedure.  The benefits include less needle sticks, lab draws from the catheter and patient may be discharged home with the catheter.  Risks include, but not limited to, infection, bleeding, blood clot (thrombus formation), and puncture of an artery; nerve damage and irregular heat beat.  Alternatives to this procedure were also discussed.  Consent obtained from wife.  PICC/Midline Placement Documentation  PICC / Midline Double Lumen 09/30/13 PICC Right Cephalic 41 cm 0 cm (Active)  Exposed Catheter (cm) 0 cm 09/30/2013 12:35 PM  Site Assessment Clean;Dry;Intact 09/30/2013 12:35 PM  Lumen #1 Status Flushed;Saline locked 09/30/2013 12:35 PM  Lumen #2 Status Flushed;Saline locked 09/30/2013 12:35 PM  Dressing Type Transparent 09/30/2013 12:35 PM  Dressing Status Clean;Dry;Intact;Antimicrobial disc in place 09/30/2013 12:35 PM  Dressing Change Due 10/07/13 09/30/2013 12:35 PM       Sekai Gitlin, Lajean ManesKerry Loraine 09/30/2013, 1:01 PM

## 2013-09-30 NOTE — Progress Notes (Signed)
PT Cancellation Note  Patient Details Name: Philip Ford MRN: [id number] DOB: 12-Nov-1945   Cancelled Treatment:    Reason Eval/Treat Not Completed: Medical issues which prohibited therapy.  PT discontinued.  Will reconsult when appropriate. 09/30/2013  [place] BingKen Heron Pitcock, PT [phone]  (pager)   Hasan Douse, Eliseo GumKenneth V 09/30/2013, 1:56 PM

## 2013-09-30 NOTE — Procedures (Signed)
Mini BAL Procedure Note Iran PlanasDonald W Mraz [id number] [birth year]  Procedure: Mini Bronchial Alveolar Lavage  Procedure Details: In preparation for procedure, Patient hyper-oxygenated with 100 % FiO2 and Saline given via ETT (20 ml) Sterile Technique used:gloves, gown and mask Amount of Saline administered: 20 (ml) Specimen amount collected: 5 (ml)  Evaluation: BP 129/62  Pulse 117  Temp(Src) 100.3 F (37.9 C) (Oral)  Resp 24  Ht 5\' 11"  (1.803 m)  Wt 200 lb (90.719 kg)  BMI 27.91 kg/m2  SpO2 97% O2 sats: stable throughout Breath Sounds: Rhonch Patient's Current Condition: stable Complications: No apparent complications Patient did tolerate procedure well.   Ok AnisKelly Smith, MA 09/30/2013, 9:48 AM

## 2013-09-30 NOTE — Progress Notes (Signed)
Dr Janee Mornhompson updated ABG from ~1149. Updated abg on cpap, pt currently back on full vent support while placing PICC line. MD ok to attempt further weaning post PICC line, no plans to extubate today per MD. Koren BoundWaggoner, Syncere Eble

## 2013-09-30 NOTE — Consult Note (Signed)
Name: Philip Ford MRN: [id number] DOB: Aug 30, 1945    ADMISSION DATE:  09/27/2013 CONSULTATION DATE:  09/29/13  REFERRING MD :  Axel Filler, MD PRIMARY SERVICE: Trauma Surgery   CHIEF COMPLAINT:   Left ribs fractures, respiratory failure.   BRIEF PATIENT DESCRIPTION:  68 years old male with mechanical fall and L 3-11 rib fractures and multiple vertebral body fractures. Intubated for acute respiratory failure and poor management of secretions. Critical care consult called to assist with mechanical ventilation management.   SIGNIFICANT EVENTS / STUDIES:  CT chest: IMPRESSION:  1. Nondisplaced fracture involving a right-sided osteophyte at T9  with extension into the right lateral and anterior portions of the  T9 vertebral body. No height loss or involvement of the middle or  posterior columns.  2. Fractures of the left T7 through L2 transverse processes and  multiple left-sided ribs   LINES / TUBES: - Peripheral IV's  CULTURES:   ANTIBIOTICS: No antibiotics  HISTORY OF PRESENT ILLNESS:   68 years old male with PMH relevant for CAD and HTN. Presented with mechanical fall with L 3-11 rib fractures and multiple vertebral body fractures. Intubated for acute respiratory failure and poor management of secretions. Critical care consult called to assist with mechanical ventilation management. At the time of my examination the patient is intubated, sedated, saturating 99% and hemodynamically stable.   PAST MEDICAL HISTORY :  Past Medical History  Diagnosis Date  . Coronary artery disease   . Hypertension    Past Surgical History  Procedure Laterality Date  . Coronary stent placement     Prior to Admission medications   Medication Sig Start Date End Date Taking? Authorizing Provider  aspirin EC 81 MG tablet Take 81 mg by mouth daily.   Yes Historical Provider, MD  hydrochlorothiazide (HYDRODIURIL) 25 MG tablet Take 1 tablet by mouth daily. 09/03/13  Yes Historical  Provider, MD  lisinopril (PRINIVIL,ZESTRIL) 10 MG tablet Take 1 tablet by mouth daily. 09/26/13  Yes Historical Provider, MD  omeprazole (PRILOSEC) 20 MG capsule Take 20 mg by mouth daily.   Yes Historical Provider, MD   No Known Allergies  FAMILY HISTORY:  History reviewed. No pertinent family history. SOCIAL HISTORY:  reports that he has been smoking.  He does not have any smokeless tobacco history on file. He reports that he drinks alcohol. He reports that he does not use illicit drugs.  REVIEW OF SYSTEMS:  Unable to provide  SUBJECTIVE:   VITAL SIGNS: Temp:  [98 F (36.7 C)-102.4 F (39.1 C)] 101.3 F (38.5 C) (02/16 0000) Pulse Rate:  [94-145] 114 (02/16 0100) Resp:  [14-32] 17 (02/16 0100) BP: (87-172)/(48-119) 123/56 mmHg (02/16 0100) SpO2:  [85 %-100 %] 99 % (02/16 0100) FiO2 (%):  [60 %-100 %] 60 % (02/15 2354) HEMODYNAMICS:   VENTILATOR SETTINGS: Vent Mode:  [-] PRVC FiO2 (%):  [60 %-100 %] 60 % Set Rate:  [16 bmp] 16 bmp Vt Set:  [450 mL] 450 mL PEEP:  [5 cmH20] 5 cmH20 Plateau Pressure:  [16 cmH20] 16 cmH20 INTAKE / OUTPUT: Intake/Output     02/15 0701 - 02/16 0700   P.O. 540   I.V. (mL/kg) 805 (8.9)   Total Intake(mL/kg) 1345 (14.8)   Urine (mL/kg/hr) 900 (0.4)   Total Output 900   Net +445         PHYSICAL EXAMINATION: General: Sedated, intubated, no acute distress. Eyes: Anicteric sclerae. Pupils are ENT: ETT in place. Trachea at midline.  Lymph: No  cervical, supraclavicular, or axillary lymphadenopathy. Heart: Normal S1, S2. No murmurs, rubs, or gallops appreciated. No bruits, equal pulses. Lungs: Normal excursion, no dullness to percussion. Good air movement bilaterally, without wheezes or crackles.  Abdomen: Abdomen soft, non-tender and not distended, normoactive bowel sounds. No hepatosplenomegaly or masses. Musculoskeletal: No clubbing or synovitis. No LE edema Skin: No rashes or lesions Neuro: Patient is unresponsive.     LABS:  CBC  Recent Labs Lab 09/27/13 2340 09/27/13 2357 09/29/13 0308  WBC 24.8*  --  13.9*  HGB 13.7 14.6 11.8*  HCT 39.5 43.0 35.0*  PLT 206  --  136*   Coag's  Recent Labs Lab 09/27/13 2340  APTT 28  INR 1.02   BMET  Recent Labs Lab 09/27/13 2340 09/27/13 2357 09/29/13 0308  NA 140 144 141  K 3.7 3.5* 4.0  CL 100 103 102  CO2 19  --  28  BUN 13 12 12   CREATININE 0.90 1.20 0.85  GLUCOSE 159* 161* 161*   Electrolytes  Recent Labs Lab 09/27/13 2340 09/29/13 0308  CALCIUM 8.1* 7.9*  MG  --  1.4*   Sepsis Markers  Recent Labs Lab 09/27/13 2359  LATICACIDVEN 5.86*   ABG  Recent Labs Lab 09/29/13 2047  PHART 7.329*  PCO2ART 55.7*  PO2ART 62.0*   Liver Enzymes No results found for this basename: AST, ALT, ALKPHOS, BILITOT, ALBUMIN,  in the last 168 hours Cardiac Enzymes  Recent Labs Lab 09/27/13 2340  TROPONINI <0.30   Glucose  Recent Labs Lab 09/29/13 1130 09/29/13 1721 09/29/13 2126  GLUCAP 169* 134* 154*    Imaging Ct Thoracic Spine Wo Contrast  09/28/2013   CLINICAL DATA:  Fall.  Left rib pain.  Evaluate for spinal fracture.  EXAM: CT THORACIC SPINE WITHOUT CONTRAST  TECHNIQUE: Multidetector CT imaging of the thoracic spine was performed without intravenous contrast administration. Multiplanar CT image reconstructions were also generated.  COMPARISON:  Rib radiographs 09/28/2013. CT abdomen and pelvis 09/28/2013.  FINDINGS: Thoracic vertebral alignment is normal. Multilevel bridging osteophytosis is present. Vertebral body heights are preserved without evidence of compression fracture. There is a nondisplaced fracture involving a right-sided osteophyte at T9 which also involves the right lateral and anterior portions of the vertebral body without height loss or definite comminution. The fracture does not clearly extend into the posterior half of the vertebral body, and no involvement of the posterior elements is identified. There  are nondisplaced left T7 through L2 transverse process fractures. There are fractures through the posterior left fourth, fifth, ninth, and tenth ribs. Additional rib fractures described on recent abdomen pelvis CT were not included on this study.  Dependent subsegmental atelectasis is present within the visualized portions of both lungs. Excreted IV contrast is partially visualized in the renal collecting systems. 9 mm left upper pole renal hypodensity is too small to fully characterize but most likely represents a cyst. Moderate aortic atherosclerosis is partially visualized.  IMPRESSION: 1. Nondisplaced fracture involving a right-sided osteophyte at T9 with extension into the right lateral and anterior portions of the T9 vertebral body. No height loss or involvement of the middle or posterior columns. 2. Fractures of the left T7 through L2 transverse processes and multiple left-sided ribs.   Electronically Signed   By: Sebastian Ache   On: 09/28/2013 04:13   Dg Chest Port 1 View  09/29/2013   CLINICAL DATA:  Intubated.  EXAM: PORTABLE CHEST - 1 VIEW  COMPARISON:  09/29/2013  FINDINGS: Endotracheal tube is approximately  4 cm above the carina. Sub is again noted are multiple displaced left rib fractures. Left lower lobe airspace opacity with left effusion again noted, unchanged. No visible pneumothorax. Right lung is clear. Heart is normal size.  IMPRESSION: Endotracheal tube 4 cm above the carina. Otherwise no significant change.   Electronically Signed   By: Charlett Nose M.D.   On: 09/29/2013 22:13   Dg Chest Port 1 View  09/29/2013   CLINICAL DATA:  Respiratory distress.  EXAM: PORTABLE CHEST - 1 VIEW  COMPARISON:  Same day.  FINDINGS: Stable cardiomediastinal silhouette. Right lung is clear. No pneumothorax is noted. Multiple displaced left rib fractures are again noted. Interval development of mild left pleural effusion is noted. Left basilar opacity most consistent with atelectasis is noted.  IMPRESSION:  Multiple displaced left rib fractures are again noted. Interval development of left basilar opacity is noted most consistent with pleural effusion and atelectasis. No pneumothorax is seen.   Electronically Signed   By: Roque Lias M.D.   On: 09/29/2013 21:27   Dg Chest Port 1 View  09/29/2013   CLINICAL DATA:  Trauma with multiple left rib fractures.  EXAM: PORTABLE CHEST - 1 VIEW  COMPARISON:  CT T SPINE W/O CM dated 09/28/2013; DG RIBS UNILATERAL*L* dated 09/28/2013; DG CHEST 1V PORT dated 09/27/2013  FINDINGS: Multiple displaced left-sided rib fractures are again noted. There is no evidence of pneumothorax or hemothorax. No focal consolidation is seen. No edema is identified. Cardiac and mediastinal contours are stable and within normal limits.  IMPRESSION: No pneumothorax or hemothorax identified.   Electronically Signed   By: Irish Lack M.D.   On: 09/29/2013 08:35     CXR:  IMPRESSION:  Endotracheal tube 4 cm above the carina. Otherwise no significant  change   ASSESSMENT / PLAN:  PULMONARY A: 1) Acute respiratory failure. Poor management of secretions 2) Left pleural effusion. Likely secondary to trauma. Management per primary team.  P:   - Mechanical ventilation   - PRVC, Vt: 8cc/kg, PEEP: 5, RR: 24, FiO2: 100% and adjust to keep O2 sat > 94%   - VAP prevention order set   - Daily awakening and SBT   CARDIOVASCULAR A:  1) Hemodynamically stable P:  - ICU monitoring  RENAL A:   1) No issues P:     GASTROINTESTINAL A:   1) No issues P:   - GI prophylaxis with protonix  HEMATOLOGIC A:   1) Mild anemia. Likely secondary to blood loss P:  - Follow CBC  INFECTIOUS A:   1) No evidence of acute infection.  Left pleural effusion. Has increased secretions and fever.   P:   - Will start Zosyn and vancomycin for possible HCAP - Tracheal aspirate cultures  ENDOCRINE A:   1) Hyperglycemia P:   - Novolog sliding scale  NEUROLOGIC A:   1) Intubated,  sedated. P:   - Sedation with PRN versed and Fentanyl  TODAY'S SUMMARY:  I have personally obtained a history, examined the patient, evaluated laboratory and imaging results, formulated the assessment and plan and placed orders. CRITICAL CARE: The patient is critically ill with multiple organ systems failure and requires high complexity decision making for assessment and support, frequent evaluation and titration of therapies, application of advanced monitoring technologies and extensive interpretation of multiple databases. Critical Care Time devoted to patient care services described in this note is 60 minutes.   Overton Mam, MD Pulmonary and Critical Care Medicine [facility] Pager: [phone])  [phone]  09/30/2013, 1:20 AM

## 2013-09-30 NOTE — Progress Notes (Signed)
Patient ID: Philip Ford, male   DOB: [birth year], 68 y.o.   MRN: [id number]   LOS: 3 days   Subjective: Awake, on vent. Answers yes/no questions.   Objective: Vital signs in last 24 hours: Temp:  [98.5 F (36.9 C)-102.4 F (39.1 C)] 99.7 F (37.6 C) (02/16 0400) Pulse Rate:  [94-145] 123 (02/16 0700) Resp:  [14-32] 24 (02/16 0700) BP: (79-172)/(45-119) 149/64 mmHg (02/16 0700) SpO2:  [85 %-100 %] 100 % (02/16 0700) FiO2 (%):  [50 %-100 %] 50 % (02/16 0355) Last BM Date: 09/27/13   VENT: PRVC/50%/5PEEP/RR24/Vt48050ml   UOP: 5955ml/h NET: +42250ml/24h TOTAL: +29423ml/admission   Laboratory CBC  Recent Labs  09/29/13 0308 09/30/13 0336  WBC 13.9* 11.8*  HGB 11.8* 10.3*  HCT 35.0* 30.6*  PLT 136* 122*   BMET  Recent Labs  09/29/13 0308 09/30/13 0336  NA 141 136*  K 4.0 3.8  CL 102 99  CO2 28 26  GLUCOSE 161* 143*  BUN 12 16  CREATININE 0.85 1.13  CALCIUM 7.9* 8.0*   CBG (last 3)   Recent Labs  09/29/13 1721 09/29/13 2126 09/30/13 0237  GLUCAP 134* 154* 143*    Radiology CXR: Pending   Physical Exam General appearance: alert and no distress Resp: clear to auscultation bilaterally Cardio: Tachycardic GI: normal findings: bowel sounds normal and soft, non-tender Pulses: 2+ and symmetric   ID BAL  2/16  Not drawn   Vanc  2/16 -- Present Zosyn  2/16 -- Present   Assessment/Plan: Fall Scalp lac L 3-11 rib fx- CXR pending. Vanc/Zosyn started empirically for presumed PNA, send BAL (appears it was not ordered) Mult thoracic TVP fxs T9 vertebral body fx -- TLSO L1 TVP fx  ABL anemia- Drifting, follow ARF -- Increase PEEP with increased FiO2 needs. Check ABG later today. RT to wean as able. May need trach but I suspect he'll wean and get at least a trial of extubation. Give propofol instead of versed to lower delirium possibility. Alcohol abuse-CIWA protocol, vitamins  HTN -- Will start on Inderal as can't take oral meds now FEN - Start TF,  decrease IVF VTE - SCD's, Lovenox (increase for weight and critical illness) Dispo -- Continue ICU   Critical care time: 0715 -- 0755    Freeman CaldronMichael J. Kenniel Bergsma, PA-C Pager: [phone] General Trauma PA Pager: [phone]  09/30/2013
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
## 2013-10-01 ENCOUNTER — Inpatient Hospital Stay (HOSPITAL_COMMUNITY): Payer: Medicare Other

## 2013-10-01 DIAGNOSIS — E876 Hypokalemia: Secondary | ICD-10-CM

## 2013-10-01 LAB — GLUCOSE, CAPILLARY
Glucose-Capillary: 162 mg/dL — ABNORMAL HIGH (ref 70–99)
Glucose-Capillary: 144 mg/dL — ABNORMAL HIGH (ref 70–99)
Glucose-Capillary: 126 mg/dL — ABNORMAL HIGH (ref 70–99)
Glucose-Capillary: 155 mg/dL — ABNORMAL HIGH (ref 70–99)
Glucose-Capillary: 172 mg/dL — ABNORMAL HIGH (ref 70–99)
Glucose-Capillary: 126 mg/dL — ABNORMAL HIGH (ref 70–99)

## 2013-10-01 LAB — POCT I-STAT 3, ART BLOOD GAS (G3+)
Acid-Base Excess: 8 mmol/L — ABNORMAL HIGH (ref 0.0–2.0)
Bicarbonate: 33.6 meq/L — ABNORMAL HIGH (ref 20.0–24.0)
O2 Saturation: 98 %
Patient temperature: 98.6
TCO2: 35 mmol/L (ref 0–100)
pCO2 arterial: 52.8 mmHg — ABNORMAL HIGH (ref 35.0–45.0)
pH, Arterial: 7.411 (ref 7.350–7.450)
pO2, Arterial: 102 mmHg — ABNORMAL HIGH (ref 80.0–100.0)

## 2013-10-01 LAB — BLOOD GAS, ARTERIAL
Acid-Base Excess: 8.1 mmol/L — ABNORMAL HIGH (ref 0.0–2.0)
Bicarbonate: 32.2 mEq/L — ABNORMAL HIGH (ref 20.0–24.0)
Drawn by: 347621
FIO2: 0.4 %
MECHVT: 500 mL
O2 Saturation: 95.1 %
PEEP: 7 cmH2O
Patient temperature: 98.6
RATE: 24 resp/min
TCO2: 33.6 mmol/L (ref 0–100)
pCO2 arterial: 45.8 mmHg — ABNORMAL HIGH (ref 35.0–45.0)
pH, Arterial: 7.461 — ABNORMAL HIGH (ref 7.350–7.450)
pO2, Arterial: 67.2 mmHg — ABNORMAL LOW (ref 80.0–100.0)

## 2013-10-01 LAB — BASIC METABOLIC PANEL
BUN: 17 mg/dL (ref 6–23)
CO2: 31 mEq/L (ref 19–32)
Calcium: 8.3 mg/dL — ABNORMAL LOW (ref 8.4–10.5)
Chloride: 99 mEq/L (ref 96–112)
Creatinine, Ser: 1.04 mg/dL (ref 0.50–1.35)
GFR calc Af Amer: 84 mL/min — ABNORMAL LOW (ref >90)
GFR calc non Af Amer: 73 mL/min — ABNORMAL LOW (ref >90)
Glucose, Bld: 153 mg/dL — ABNORMAL HIGH (ref 70–99)
Potassium: 3.3 mEq/L — ABNORMAL LOW (ref 3.7–5.3)
Sodium: 140 mEq/L (ref 137–147)

## 2013-10-01 LAB — CBC
HCT: 27.1 % — ABNORMAL LOW (ref 39.0–52.0)
Hemoglobin: 9.2 g/dL — ABNORMAL LOW (ref 13.0–17.0)
MCH: 33 pg (ref 26.0–34.0)
MCHC: 33.9 g/dL (ref 30.0–36.0)
MCV: 97.1 fL (ref 78.0–100.0)
Platelets: 114 10*3/uL — ABNORMAL LOW (ref 150–400)
RBC: 2.79 MIL/uL — ABNORMAL LOW (ref 4.22–5.81)
RDW: 12.5 % (ref 11.5–15.5)
WBC: 10.2 10*3/uL (ref 4.0–10.5)

## 2013-10-01 MED ORDER — ALBUMIN HUMAN 5 % IV SOLN
25.0000 g | Freq: Once | INTRAVENOUS | Status: AC
  Administered 2013-10-01: 25 g via INTRAVENOUS

## 2013-10-01 MED ORDER — POTASSIUM CHLORIDE 20 MEQ/15ML (10%) PO LIQD
40.0000 meq | Freq: Two times a day (BID) | ORAL | Status: DC
  Administered 2013-10-01 – 2013-10-02 (×4): 40 meq
  Filled 2013-10-01 (×6): qty 30

## 2013-10-01 MED ORDER — ALBUMIN HUMAN 5 % IV SOLN
INTRAVENOUS | Status: AC
  Filled 2013-10-01: qty 250

## 2013-10-01 NOTE — Progress Notes (Signed)
UR completed.  Elza Varricchio, RN BSN MHA CCM Trauma/Neuro ICU Case Manager [phone]  

## 2013-10-01 NOTE — Progress Notes (Addendum)
Volume resuscitate with 500cc albumin. Check F/U ABG. Concern for early sepsis/PNA. Pan CXs P. F/U oxygenation/BD on ABG 1200. I spoke with his wife at the bedside. Patient examined and I agree with the assessment and plan  Violeta GelinasBurke Council Munguia, MD, MPH, FACS Pager: [phone]  10/01/2013 10:09 AM

## 2013-10-01 NOTE — Progress Notes (Signed)
Patient ID: Philip Ford, male   DOB: [birth year], 68 y.o.   MRN: [id number]   LOS: 4 days   Subjective: Awake, on vent.   Objective: Vital signs in last 24 hours: Temp:  [98.8 F (37.1 C)-102.8 F (39.3 C)] 101.2 F (38.4 C) (02/17 0400) Pulse Rate:  [90-126] 98 (02/17 0700) Resp:  [18-29] 25 (02/17 0700) BP: (102-167)/(44-69) 117/51 mmHg (02/17 0700) SpO2:  [94 %-100 %] 99 % (02/17 0700) FiO2 (%):  [40 %] 40 % (02/17 0527) Weight:  [213 lb 10 oz (96.9 kg)] 213 lb 10 oz (96.9 kg) (02/17 0500) Last BM Date: 09/27/13   VENT: PRVC/40%/PEEP7/RR24/Vt500   UOP: 5165ml/h NET: +157990ml/24h TOTAL: +453413ml/admission   Laboratory CBC  Recent Labs  09/30/13 0336 10/01/13 0435  WBC 11.8* 10.2  HGB 10.3* 9.2*  HCT 30.6* 27.1*  PLT 122* 114*   BMET  Recent Labs  09/30/13 0336 10/01/13 0435  NA 136* 140  K 3.8 3.3*  CL 99 99  CO2 26 31  GLUCOSE 143* 153*  BUN 16 17  CREATININE 1.13 1.04  CALCIUM 8.0* 8.3*   CBG (last 3)   Recent Labs  09/30/13 1952 09/30/13 2351 10/01/13 0422  GLUCAP 127* 162* 144*   ABG    Component Value Date/Time   PHART 7.461* 10/01/2013 0539   PCO2ART 45.8* 10/01/2013 0539   PO2ART 67.2* 10/01/2013 0539   HCO3 32.2* 10/01/2013 0539   TCO2 33.6 10/01/2013 0539   O2SAT 95.1 10/01/2013 0539   P/F: 168  Radiology CXR: ETT in good position, NSC LLL opacity (official read pending)   Physical Exam General appearance: alert and no distress Resp: clear to auscultation bilaterally Cardio: regular rate and rhythm GI: normal findings: bowel sounds normal and soft, non-tender Pulses: 2+ and symmetric   ID BAL   2/16   Pending  Vanc   2/16 -- Present  Zosyn   2/16 -- Present   Assessment/Plan: Fall  Scalp lac  L 3-11 rib fx -- Pulmonary toilet Mult thoracic TVP fxs  T9 vertebral body fx -- TLSO  L1 TVP fx  ABL anemia- Drifting, follow  ARF -- Increase Vt with continued hypercapnia. He looks good but PaO2 and base deficit don't  bode well for extubation. ID -- Vanc/Zosyn D#2 empiric, WBC decreased, fevers increased. Will send urine culture and BCx2 as well Alcohol abuse -- CIWA protocol, vitamins  HTN -- Improved FEN - Add K+ for mild hypokalemia VTE - SCD's, Lovenox  Dispo -- Continue ICU   Critical care time: 0725 -- 0810    Freeman CaldronMichael J. Sugar Vanzandt, PA-C Pager: [phone] General Trauma PA Pager: [phone]  10/01/2013

## 2013-10-01 NOTE — Progress Notes (Signed)
Pt was placed back on full support by Charma IgoMichael Jeffery, PA around 0830 per RN.

## 2013-10-02 ENCOUNTER — Inpatient Hospital Stay (HOSPITAL_COMMUNITY): Payer: Medicare Other

## 2013-10-02 LAB — BASIC METABOLIC PANEL WITH GFR
BUN: 22 mg/dL (ref 6–23)
CO2: 34 meq/L — ABNORMAL HIGH (ref 19–32)
Calcium: 8.3 mg/dL — ABNORMAL LOW (ref 8.4–10.5)
Chloride: 98 meq/L (ref 96–112)
Creatinine, Ser: 1.05 mg/dL (ref 0.50–1.35)
GFR calc Af Amer: 83 mL/min — ABNORMAL LOW
GFR calc non Af Amer: 72 mL/min — ABNORMAL LOW
Glucose, Bld: 163 mg/dL — ABNORMAL HIGH (ref 70–99)
Potassium: 3.6 meq/L — ABNORMAL LOW (ref 3.7–5.3)
Sodium: 141 meq/L (ref 137–147)

## 2013-10-02 LAB — BLOOD GAS, ARTERIAL
Acid-Base Excess: 7.9 mmol/L — ABNORMAL HIGH (ref 0.0–2.0)
Bicarbonate: 32.1 mEq/L — ABNORMAL HIGH (ref 20.0–24.0)
Drawn by: 347621
FIO2: 0.4 %
MECHVT: 550 mL
O2 Saturation: 97.4 %
PEEP: 7 cmH2O
Patient temperature: 98.2
RATE: 15 resp/min
TCO2: 33.5 mmol/L (ref 0–100)
pCO2 arterial: 46.1 mmHg — ABNORMAL HIGH (ref 35.0–45.0)
pH, Arterial: 7.456 — ABNORMAL HIGH (ref 7.350–7.450)
pO2, Arterial: 86.9 mmHg (ref 80.0–100.0)

## 2013-10-02 LAB — CULTURE, BAL-QUANTITATIVE
Colony Count: NO GROWTH
Culture: NO GROWTH

## 2013-10-02 LAB — POCT I-STAT 3, ART BLOOD GAS (G3+)
Acid-Base Excess: 10 mmol/L — ABNORMAL HIGH (ref 0.0–2.0)
Bicarbonate: 34.7 meq/L — ABNORMAL HIGH (ref 20.0–24.0)
O2 Saturation: 97 %
Patient temperature: 98.3
TCO2: 36 mmol/L (ref 0–100)
pCO2 arterial: 48.4 mmHg — ABNORMAL HIGH (ref 35.0–45.0)
pH, Arterial: 7.463 — ABNORMAL HIGH (ref 7.350–7.450)
pO2, Arterial: 90 mmHg (ref 80.0–100.0)

## 2013-10-02 LAB — CBC
HCT: 26 % — ABNORMAL LOW (ref 39.0–52.0)
Hemoglobin: 8.8 g/dL — ABNORMAL LOW (ref 13.0–17.0)
MCH: 32.7 pg (ref 26.0–34.0)
MCHC: 33.8 g/dL (ref 30.0–36.0)
MCV: 96.7 fL (ref 78.0–100.0)
Platelets: 122 K/uL — ABNORMAL LOW (ref 150–400)
RBC: 2.69 MIL/uL — ABNORMAL LOW (ref 4.22–5.81)
RDW: 12.7 % (ref 11.5–15.5)
WBC: 8.1 K/uL (ref 4.0–10.5)

## 2013-10-02 LAB — GLUCOSE, CAPILLARY
Glucose-Capillary: 100 mg/dL — ABNORMAL HIGH (ref 70–99)
Glucose-Capillary: 136 mg/dL — ABNORMAL HIGH (ref 70–99)
Glucose-Capillary: 153 mg/dL — ABNORMAL HIGH (ref 70–99)
Glucose-Capillary: 158 mg/dL — ABNORMAL HIGH (ref 70–99)
Glucose-Capillary: 159 mg/dL — ABNORMAL HIGH (ref 70–99)
Glucose-Capillary: 72 mg/dL (ref 70–99)
Glucose-Capillary: 99 mg/dL (ref 70–99)

## 2013-10-02 LAB — URINE CULTURE
Colony Count: NO GROWTH
Culture: NO GROWTH

## 2013-10-02 LAB — CULTURE, BAL-QUANTITATIVE W GRAM STAIN: Gram Stain: NONE SEEN

## 2013-10-02 MED ORDER — KETOROLAC TROMETHAMINE 30 MG/ML IJ SOLN
30.0000 mg | Freq: Once | INTRAMUSCULAR | Status: AC
  Administered 2013-10-02: 30 mg via INTRAVENOUS
  Filled 2013-10-02: qty 1

## 2013-10-02 MED ORDER — FUROSEMIDE 10 MG/ML IJ SOLN
20.0000 mg | Freq: Once | INTRAMUSCULAR | Status: AC
  Administered 2013-10-02: 20 mg via INTRAVENOUS
  Filled 2013-10-02: qty 2

## 2013-10-02 MED ORDER — IPRATROPIUM-ALBUTEROL 0.5-2.5 (3) MG/3ML IN SOLN
3.0000 mL | Freq: Four times a day (QID) | RESPIRATORY_TRACT | Status: DC
  Administered 2013-10-02 – 2013-10-03 (×4): 3 mL via RESPIRATORY_TRACT
  Filled 2013-10-02 (×4): qty 3
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
## 2013-10-02 NOTE — Plan of Care (Signed)
Problem: Phase II Progression Outcomes Goal: Date pt extubated/weaned off vent Outcome: Completed/Met Date Met:  10/02/13 Extubated 10/02/13 Goal: Time pt extubated/weaned off vent Outcome: Completed/Met Date Met:  10/02/13 1030

## 2013-10-02 NOTE — Progress Notes (Signed)
Follow up - Trauma and Critical Care  Patient Details:    Philip Ford is an 68 y.o. male.  Lines/tubes : Airway 7.5 mm (Active)  Secured at (cm) 23 cm 10/02/2013  7:36 AM  Measured From Lips 10/02/2013  7:36 AM  Secured Location Center 10/02/2013  7:36 AM  Secured By [employer] 10/02/2013  7:36 AM  Tube Holder Repositioned Yes 10/02/2013  7:36 AM  Cuff Pressure (cm H2O) 22 cm H2O 10/01/2013 12:22 AM  Site Condition Dry 10/02/2013  7:36 AM     Airway 7.5 mm (Active)  Secured at (cm) 23 cm 09/29/2013 12:00 AM     PICC / Midline Double Lumen 09/30/13 PICC Right Cephalic 41 cm 0 cm (Active)  Indication for Insertion or Continuance of Line Prolonged intravenous therapies 10/01/2013  8:00 PM  Exposed Catheter (cm) 0 cm 09/30/2013 12:35 PM  Site Assessment Clean;Dry;Intact 10/01/2013  8:00 PM  Lumen #1 Status Infusing 10/01/2013  8:00 PM  Lumen #2 Status Infusing 10/01/2013  8:00 PM  Dressing Type Transparent 10/01/2013  8:00 PM  Dressing Status Clean;Dry;Intact;Antimicrobial disc in place 10/01/2013  8:00 PM  Dressing Change Due 10/07/13 09/30/2013  1:00 PM     NG/OG Tube Orogastric Center mouth (Active)  Placement Verification Auscultation 10/01/2013  8:00 PM  Site Assessment Clean;Dry 10/01/2013  8:00 PM  Status Infusing tube feed 10/01/2013  8:00 PM  Drainage Appearance Bile 10/01/2013  8:00 AM  Gastric Residual 20 mL 10/01/2013  4:00 PM  Intake (mL) 30 mL 10/02/2013  4:00 AM  Output (mL) 100 mL 09/30/2013  4:00 AM     Urethral Catheter alan currin rn Non-latex;Straight-tip 16 Fr. (Active)  Indication for Insertion or Continuance of Catheter Unstable critical patients (first 24-48 hours) 10/01/2013  8:00 PM  Site Assessment Clean;Intact 10/01/2013  8:00 PM  Catheter Maintenance Bag below level of bladder;Catheter secured;Drainage bag/tubing not touching floor;No dependent loops;Seal intact 10/01/2013  8:00 PM  Collection Container Standard drainage bag 10/01/2013  8:00 PM  Securement  Method Leg strap 10/01/2013  8:00 PM  Urinary Catheter Interventions Unclamped 10/01/2013  8:00 PM  Output (mL) 200 mL 10/02/2013  8:00 AM    Microbiology/Sepsis markers: Results for orders placed during the hospital encounter of 09/27/13  MRSA PCR SCREENING     Status: None   Collection Time    09/28/13  4:05 AM      Result Value Ref Range Status   MRSA by PCR NEGATIVE  NEGATIVE Final   Comment:            The GeneXpert MRSA Assay (FDA     approved for NASAL specimens     only), is one component of a     comprehensive MRSA colonization     surveillance program. It is not     intended to diagnose MRSA     infection nor to guide or     monitor treatment for     MRSA infections.  CULTURE, BAL-QUANTITATIVE     Status: None   Collection Time    09/30/13  9:25 AM      Result Value Ref Range Status   Specimen Description BRONCHIAL ALVEOLAR LAVAGE   Final   Special Requests NONE   Final   Gram Stain     Final   Value: NO WBC SEEN     NO SQUAMOUS EPITHELIAL CELLS SEEN     NO ORGANISMS SEEN     Performed at [facility]  Colony Count PENDING   Incomplete   Culture     Final   Value: NO GROWTH 1 DAY     Performed at [facility]   Report Status PENDING   Incomplete  URINE CULTURE     Status: None   Collection Time    10/01/13 10:11 AM      Result Value Ref Range Status   Specimen Description URINE, CATHETERIZED   Final   Special Requests NONE   Final   Culture  Setup Time     Final   Value: 10/01/2013 11:20     Performed at [facility] Count     Final   Value: NO GROWTH     Performed at [facility]   Culture     Final   Value: NO GROWTH     Performed at [facility]   Report Status 10/02/2013 FINAL   Final    Anti-infectives:  Anti-infectives   Start     Dose/Rate Route Frequency Ordered Stop   09/30/13 0800  vancomycin (VANCOCIN) IVPB 1000 mg/200 mL premix     1,000 mg 200 mL/hr over 60 Minutes Intravenous Every 8  hours 09/30/13 0149     09/30/13 0800  piperacillin-tazobactam (ZOSYN) IVPB 3.375 g     3.375 g 12.5 mL/hr over 240 Minutes Intravenous Every 8 hours 09/30/13 0149     09/30/13 0200  vancomycin (VANCOCIN) IVPB 1000 mg/200 mL premix     1,000 mg 200 mL/hr over 60 Minutes Intravenous  Once 09/30/13 0149 09/30/13 0341   09/30/13 0200  piperacillin-tazobactam (ZOSYN) IVPB 3.375 g     3.375 g 100 mL/hr over 30 Minutes Intravenous  Once 09/30/13 0149 09/30/13 1914      Best Practice/Protocols:  VTE Prophylaxis: Lovenox (prophylaxtic dose) and Mechanical GI Prophylaxis: Proton Pump Inhibitor Intermittent Sedation  Consults:      Events:  Subjective:    Overnight Issues: Patient still with significant secretions.  Otherwise stable  Objective:  Vital signs for last 24 hours: Temp:  [98.2 F (36.8 C)-99.3 F (37.4 C)] 98.3 F (36.8 C) (02/18 0735) Pulse Rate:  [80-95] 81 (02/18 0800) Resp:  [15-30] 19 (02/18 0800) BP: (105-132)/(51-71) 119/54 mmHg (02/18 0800) SpO2:  [94 %-100 %] 99 % (02/18 0800) FiO2 (%):  [40 %] 40 % (02/18 0736) Weight:  [97.2 kg (214 lb 4.6 oz)] 97.2 kg (214 lb 4.6 oz) (02/18 0500)  Hemodynamic parameters for last 24 hours:    Intake/Output from previous day: 02/17 0701 - 02/18 0700 In: 2724.7 [I.V.:509.7; NW/GN:5621; IV Piggyback:750] Out: 2050 [Urine:2050]  Intake/Output this shift: Total I/O In: 212.5 [IV Piggyback:212.5] Out: 200 [Urine:200]  Vent settings for last 24 hours: Vent Mode:  [-] CPAP;PSV FiO2 (%):  [40 %] 40 % Set Rate:  [15 bmp] 15 bmp Vt Set:  [550 mL] 550 mL PEEP:  [7 cmH20] 7 cmH20 Pressure Support:  [7 cmH20] 7 cmH20 Plateau Pressure:  [18 cmH20-21 cmH20] 21 cmH20  Physical Exam:  General: alert and no respiratory distress Neuro: alert, oriented and nonfocal exam Resp: clear to auscultation bilaterally and still having lots of secretions.  Lots of pain. GI: soft, nontender, BS WNL, no r/g, distended and having bowel  movements Extremities: no edema, no erythema, pulses WNL and no clinical evidence of DVT  Results for orders placed during the hospital encounter of 09/27/13 (from the past 24 hour(s))  URINE CULTURE     Status: None  Collection Time    10/01/13 10:11 AM      Result Value Ref Range   Specimen Description URINE, CATHETERIZED     Special Requests NONE     Culture  Setup Time       Value: 10/01/2013 11:20     Performed at [facility] Count       Value: NO GROWTH     Performed at [facility]   Culture       Value: NO GROWTH     Performed at [facility]   Report Status 10/02/2013 FINAL    POCT I-STAT 3, BLOOD GAS (G3+)     Status: Abnormal   Collection Time    10/01/13 12:10 PM      Result Value Ref Range   pH, Arterial 7.411  7.350 - 7.450   pCO2 arterial 52.8 (*) 35.0 - 45.0 mmHg   pO2, Arterial 102.0 (*) 80.0 - 100.0 mmHg   Bicarbonate 33.6 (*) 20.0 - 24.0 mEq/L   TCO2 35  0 - 100 mmol/L   O2 Saturation 98.0     Acid-Base Excess 8.0 (*) 0.0 - 2.0 mmol/L   Patient temperature 98.6 F     Collection site RADIAL, ALLEN'S TEST ACCEPTABLE     Drawn by Operator     Sample type ARTERIAL    GLUCOSE, CAPILLARY     Status: Abnormal   Collection Time    10/01/13 12:24 PM      Result Value Ref Range   Glucose-Capillary 155 (*) 70 - 99 mg/dL   Comment 1 Notify RN     Comment 2 Documented in Chart    GLUCOSE, CAPILLARY     Status: Abnormal   Collection Time    10/01/13  5:06 PM      Result Value Ref Range   Glucose-Capillary 172 (*) 70 - 99 mg/dL  GLUCOSE, CAPILLARY     Status: Abnormal   Collection Time    10/01/13  7:37 PM      Result Value Ref Range   Glucose-Capillary 126 (*) 70 - 99 mg/dL   Comment 1 Notify RN     Comment 2 Documented in Chart    GLUCOSE, CAPILLARY     Status: Abnormal   Collection Time    10/01/13 11:41 PM      Result Value Ref Range   Glucose-Capillary 136 (*) 70 - 99 mg/dL   Comment 1 Documented in Chart      Comment 2 Notify RN    GLUCOSE, CAPILLARY     Status: Abnormal   Collection Time    10/02/13  3:48 AM      Result Value Ref Range   Glucose-Capillary 159 (*) 70 - 99 mg/dL   Comment 1 Documented in Chart     Comment 2 Notify RN    BLOOD GAS, ARTERIAL     Status: Abnormal   Collection Time    10/02/13  4:29 AM      Result Value Ref Range   FIO2 0.40     Delivery systems VENTILATOR     Mode PRESSURE REGULATED VOLUME CONTROL     VT 550     Rate 15     Peep/cpap 7.0     pH, Arterial 7.456 (*) 7.350 - 7.450   pCO2 arterial 46.1 (*) 35.0 - 45.0 mmHg   pO2, Arterial 86.9  80.0 - 100.0 mmHg   Bicarbonate 32.1 (*) 20.0 - 24.0 mEq/L  TCO2 33.5  0 - 100 mmol/L   Acid-Base Excess 7.9 (*) 0.0 - 2.0 mmol/L   O2 Saturation 97.4     Patient temperature 98.2     Collection site LEFT RADIAL     Drawn by [id number]     Sample type ARTERIAL     Allens test (pass/fail) PASS  PASS  CBC     Status: Abnormal   Collection Time    10/02/13  5:00 AM      Result Value Ref Range   WBC 8.1  4.0 - 10.5 K/uL   RBC 2.69 (*) 4.22 - 5.81 MIL/uL   Hemoglobin 8.8 (*) 13.0 - 17.0 g/dL   HCT 04.526.0 (*) 40.939.0 - 81.152.0 %   MCV 96.7  78.0 - 100.0 fL   MCH 32.7  26.0 - 34.0 pg   MCHC 33.8  30.0 - 36.0 g/dL   RDW 91.412.7  78.211.5 - 95.615.5 %   Platelets 122 (*) 150 - 400 K/uL  BASIC METABOLIC PANEL     Status: Abnormal   Collection Time    10/02/13  5:00 AM      Result Value Ref Range   Sodium 141  137 - 147 mEq/L   Potassium 3.6 (*) 3.7 - 5.3 mEq/L   Chloride 98  96 - 112 mEq/L   CO2 34 (*) 19 - 32 mEq/L   Glucose, Bld 163 (*) 70 - 99 mg/dL   BUN 22  6 - 23 mg/dL   Creatinine, Ser 2.131.05  0.50 - 1.35 mg/dL   Calcium 8.3 (*) 8.4 - 10.5 mg/dL   GFR calc non Af Amer 72 (*) >90 mL/min   GFR calc Af Amer 83 (*) >90 mL/min  GLUCOSE, CAPILLARY     Status: Abnormal   Collection Time    10/02/13  7:33 AM      Result Value Ref Range   Glucose-Capillary 153 (*) 70 - 99 mg/dL   Comment 1 Notify RN       Assessment/Plan:    NEURO  No issues   Plan: CPM, minimize sedations while trying to extubate  PULM  Respiratory Acidosis (chronic)   Plan: Will still wean to extubate  CARDIO  No specific issues   Plan: CPM  RENAL  No issues   Plan: May give some Lasix for extubation  GI  No issues, tolerating tube feedings well   Plan: CPM  ID  No known infectious source   Plan: Continue empiric antibiotics.  Her respiratory cultures are negative so far  HEME  Anemia acute blood loss anemia and anemia of critical illness)   Plan: No blood for now  ENDO No specific issues   Plan: CPM  Global Issues  Although the patient has some secretions, the only other change would be to give the patient a trach, and I believe that a trial of extubation again would be appropriate.  Will get ABG on PS settings, then try to extubate later this AM    LOS: 5 days   Additional comments:I reviewed the patient's new clinical lab test results. cbc/bmet and I reviewed the patients new imaging test results. cxr  Critical Care Total Time*: 30 Minutes  Chima Astorino O 10/02/2013  *Care during the described time interval was provided by me and/or other providers on the critical care team.  I have reviewed this patient's available data, including medical history, events of note, physical examination and test results as part of my evaluation.

## 2013-10-02 NOTE — Progress Notes (Signed)
Physical Therapy Treatment Patient Details Name: Philip Ford PlanasDonald W Garry MRN: [id number] DOB: [birth year] Today's Date: 10/02/2013 Time: [id number] PT Time Calculation (min): 23 min  PT Assessment / Plan / Recommendation  History of Present Illness The patient is 68 yo male involved in fall down stairs. Came to [place] where work up showed multiple rib fractures, T9 fracture, and multiple transverse process fractures. Neurosurgical input requested.  Pt intubated 2/16 and finally extubated 2/18.   PT Comments   Pt just extubated today and because of this and increased pain with movement, limited session today.  Pt could benefit from short stay on rehab to improve mobility and function.     Follow Up Recommendations  Other (comment);CIR     Does the patient have the potential to tolerate intense rehabilitation     Barriers to Discharge        Equipment Recommendations  Other (comment) (TBA)    Recommendations for Other Services    Frequency Min 5X/week   Progress towards PT Goals Progress towards PT goals: Progressing toward goals  Plan Current plan remains appropriate    Precautions / Restrictions Precautions Precautions: Fall;Back Required Braces or Orthoses: Spinal Brace Spinal Brace: Thoracolumbosacral orthotic   Pertinent Vitals/Pain sats remained at 91% at rest at EOB                             Mobility  Bed Mobility Overal bed mobility: Needs Assistance Bed Mobility: Rolling;Sidelying to Sit Rolling: Max assist;+2 for safety/equipment Sidelying to sit: Mod assist;+2 for safety/equipment General bed mobility comments: cuing for technique, truncal assist  discussion of back precautions as related to bed mobility. Transfers Overall transfer level: Needs assistance Transfers: Sit to/from Stand Sit to Stand: +2 safety/equipment;Min assist Stand pivot transfers: Min assist;+2 safety/equipment (8 steps for total transfer) General transfer comment: stability assist    Exercises      PT Diagnosis:    PT Problem List:   PT Treatment Interventions:     PT Goals (current goals can now be found in the care plan section) Acute Rehab PT Goals Patient Stated Goal: home independent so wife can work Time For Goal Achievement: 10/13/13 Potential to Achieve Goals: Good  Visit Information  Last PT Received On: 10/02/13 Assistance Needed: +2 History of Present Illness: The patient is 68 yo male involved in fall down stairs. Came to [place] where work up showed multiple rib fractures, T9 fracture, and multiple transverse process fractures. Neurosurgical input requested.  Pt intubated 2/16 and finally extubated 2/18.    Subjective Data  Subjective: We're going to have to roll the other way the L side is too painful. Patient Stated Goal: home independent so wife can work   Copywriter, advertisingCognition  Cognition Arousal/Alertness: Awake/alert Behavior During Therapy: WFL for tasks assessed/performed Overall Cognitive Status: Within Functional Limits for tasks assessed    Balance  Balance Overall balance assessment: No apparent balance deficits (not formally assessed);Needs assistance Sitting-balance support: Single extremity supported Sitting balance-Leahy Scale: Fair Standing balance support: Single extremity supported Standing balance-Leahy Scale: Fair  End of Session PT - End of Session Equipment Utilized During Treatment: Back brace;Oxygen Activity Tolerance: Patient tolerated treatment well;Patient limited by pain Patient left: in chair;with call bell/phone within reach;with nursing/sitter in room Nurse Communication: Mobility status;Precautions   GP     Ramirez Fullbright, Eliseo GumKenneth V 10/02/2013, 1:59 PM 10/02/2013  [place] BingKen Ilynn Stauffer, PT [phone]  (pager)

## 2013-10-02 NOTE — Progress Notes (Signed)
Called MD with ABG results per request for extubation. Dr. Lindie SpruceWyatt stated to extubate patient.

## 2013-10-02 NOTE — Procedures (Addendum)
Extubation Procedure Note  Patient Details:   Name: [place] PlanasDonald W Danielson DOB: 07/31/1946 MRN: [id number]   Airway Documentation:     Evaluation  O2 sats: stable throughout Complications: No apparent complications Patient did tolerate procedure well. Bilateral Breath Sounds: Diminished;Clear Suctioning: Airway Yes pt able to vocalize.   Pt extubated at this time per MD order and tolerated well. No complications noted. Pt able to breathe around deflated cuff. VS stable. No stridor noted. Pt able has adequate, productive cough. Pt placed on 4L A[place] and IS done x8 breaths 1000-122550mL. RT will continue to monitor.   Loyal Jacobsonhompson, Aracelli Wol[facility]yn [facility] Campus[facility]ette 10/02/2013, 10:35 AM

## 2013-10-02 NOTE — Progress Notes (Signed)
I agree with the Student-Dietitian note and made appropriate revisions.  Katie Kolette Vey, RD, LDN Pager #: [phone] After-Hours Pager #: [phone]  

## 2013-10-02 NOTE — Progress Notes (Signed)
Rehab Admissions Coordinator Note:  Patient was screened by Clois DupesBoyette, Sukhman Martine Godwin for appropriateness for an Inpatient Acute Rehab Consult.  At this time, we are recommending Inpatient Rehab consult. I have contacted Trauma PA  Clois DupesBoyette, Georga Stys Godwin 10/02/2013, 4:25 PM  I can be reached at [phone].

## 2013-10-03 ENCOUNTER — Inpatient Hospital Stay (HOSPITAL_COMMUNITY): Payer: Medicare Other

## 2013-10-03 LAB — BASIC METABOLIC PANEL
BUN: 26 mg/dL — ABNORMAL HIGH (ref 6–23)
BUN: 25 mg/dL — ABNORMAL HIGH (ref 6–23)
CO2: 32 mEq/L (ref 19–32)
CO2: 32 mEq/L (ref 19–32)
Calcium: 8.6 mg/dL (ref 8.4–10.5)
Calcium: 8.6 mg/dL (ref 8.4–10.5)
Chloride: 97 mEq/L (ref 96–112)
Chloride: 95 mEq/L — ABNORMAL LOW (ref 96–112)
Creatinine, Ser: 1.16 mg/dL (ref 0.50–1.35)
Creatinine, Ser: 1.11 mg/dL (ref 0.50–1.35)
GFR calc Af Amer: 74 mL/min — ABNORMAL LOW (ref >90)
GFR calc Af Amer: 78 mL/min — ABNORMAL LOW (ref >90)
GFR calc non Af Amer: 64 mL/min — ABNORMAL LOW (ref >90)
GFR calc non Af Amer: 67 mL/min — ABNORMAL LOW (ref >90)
Glucose, Bld: 104 mg/dL — ABNORMAL HIGH (ref 70–99)
Glucose, Bld: 129 mg/dL — ABNORMAL HIGH (ref 70–99)
Potassium: 3.8 mEq/L (ref 3.7–5.3)
Potassium: 4 mEq/L (ref 3.7–5.3)
Sodium: 140 mEq/L (ref 137–147)
Sodium: 138 mEq/L (ref 137–147)

## 2013-10-03 LAB — CBC WITH DIFFERENTIAL/PLATELET
Basophils Absolute: 0 10*3/uL (ref 0.0–0.1)
Basophils Relative: 1 % (ref 0–1)
Eosinophils Absolute: 0.6 10*3/uL (ref 0.0–0.7)
Eosinophils Relative: 7 % — ABNORMAL HIGH (ref 0–5)
HCT: 29.1 % — ABNORMAL LOW (ref 39.0–52.0)
Hemoglobin: 9.7 g/dL — ABNORMAL LOW (ref 13.0–17.0)
Lymphocytes Relative: 15 % (ref 12–46)
Lymphs Abs: 1.3 10*3/uL (ref 0.7–4.0)
MCH: 32 pg (ref 26.0–34.0)
MCHC: 33.3 g/dL (ref 30.0–36.0)
MCV: 96 fL (ref 78.0–100.0)
Monocytes Absolute: 1.3 10*3/uL — ABNORMAL HIGH (ref 0.1–1.0)
Monocytes Relative: 15 % — ABNORMAL HIGH (ref 3–12)
Neutro Abs: 5.4 10*3/uL (ref 1.7–7.7)
Neutrophils Relative %: 62 % (ref 43–77)
Platelets: 167 10*3/uL (ref 150–400)
RBC: 3.03 MIL/uL — ABNORMAL LOW (ref 4.22–5.81)
RDW: 12.7 % (ref 11.5–15.5)
WBC: 8.7 10*3/uL (ref 4.0–10.5)

## 2013-10-03 LAB — GLUCOSE, CAPILLARY
Glucose-Capillary: 103 mg/dL — ABNORMAL HIGH (ref 70–99)
Glucose-Capillary: 103 mg/dL — ABNORMAL HIGH (ref 70–99)
Glucose-Capillary: 115 mg/dL — ABNORMAL HIGH (ref 70–99)
Glucose-Capillary: 125 mg/dL — ABNORMAL HIGH (ref 70–99)
Glucose-Capillary: 135 mg/dL — ABNORMAL HIGH (ref 70–99)
Glucose-Capillary: 93 mg/dL (ref 70–99)

## 2013-10-03 LAB — VANCOMYCIN, TROUGH: Vancomycin Tr: 18.9 ug/mL (ref 10.0–20.0)

## 2013-10-03 MED ORDER — PROPRANOLOL HCL 20 MG PO TABS
20.0000 mg | ORAL_TABLET | Freq: Four times a day (QID) | ORAL | Status: DC
  Administered 2013-10-03 – 2013-10-05 (×8): 20 mg via ORAL
  Filled 2013-10-03 (×12): qty 1

## 2013-10-03 MED ORDER — KETOROLAC TROMETHAMINE 15 MG/ML IJ SOLN
15.0000 mg | Freq: Three times a day (TID) | INTRAMUSCULAR | Status: AC
  Administered 2013-10-03 – 2013-10-04 (×3): 15 mg via INTRAVENOUS
  Filled 2013-10-03 (×3): qty 1

## 2013-10-03 MED ORDER — VITAMIN E 180 MG (400 UNIT) PO CAPS
400.0000 [IU] | ORAL_CAPSULE | Freq: Three times a day (TID) | ORAL | Status: DC
  Administered 2013-10-03 – 2013-10-06 (×10): 400 [IU] via ORAL
  Filled 2013-10-03 (×12): qty 1

## 2013-10-03 MED ORDER — FUROSEMIDE 10 MG/ML IJ SOLN
20.0000 mg | Freq: Once | INTRAMUSCULAR | Status: AC
  Administered 2013-10-03: 20 mg via INTRAVENOUS
  Filled 2013-10-03: qty 2

## 2013-10-03 MED ORDER — POTASSIUM CHLORIDE CRYS ER 20 MEQ PO TBCR
40.0000 meq | EXTENDED_RELEASE_TABLET | Freq: Two times a day (BID) | ORAL | Status: DC
  Administered 2013-10-03 – 2013-10-06 (×5): 40 meq via ORAL
  Filled 2013-10-03 (×10): qty 2

## 2013-10-03 MED ORDER — VITAMIN E 180 MG (400 UNIT) PO CAPS
400.0000 [IU] | ORAL_CAPSULE | Freq: Every day | ORAL | Status: DC
  Filled 2013-10-03: qty 1

## 2013-10-03 MED ORDER — ACETAMINOPHEN 325 MG PO TABS: 650.0000 mg | ORAL_TABLET | ORAL | Status: DC | PRN

## 2013-10-03 MED ORDER — CHOLECALCIFEROL 10 MCG (400 UNIT) PO TABS
400.0000 [IU] | ORAL_TABLET | Freq: Every day | ORAL | Status: DC
  Filled 2013-10-03: qty 1

## 2013-10-03 MED ORDER — PANTOPRAZOLE SODIUM 40 MG PO TBEC
40.0000 mg | DELAYED_RELEASE_TABLET | Freq: Every day | ORAL | Status: DC
  Administered 2013-10-03 – 2013-10-06 (×4): 40 mg via ORAL
  Filled 2013-10-03 (×5): qty 1

## 2013-10-03 MED ORDER — IPRATROPIUM-ALBUTEROL 0.5-2.5 (3) MG/3ML IN SOLN
3.0000 mL | Freq: Two times a day (BID) | RESPIRATORY_TRACT | Status: DC
  Administered 2013-10-03 – 2013-10-06 (×5): 3 mL via RESPIRATORY_TRACT
  Filled 2013-10-03 (×5): qty 3

## 2013-10-03 NOTE — Progress Notes (Signed)
Physical Therapy Treatment Patient Details Name: Philip Ford MRN: [id number] DOB: 04-19-1946 Today's Date: 10/03/2013 Time: 1200-1228 PT Time Calculation (min): 28 min  PT Assessment / Plan / Recommendation  History of Present Illness The patient is 68 yo male involved in fall down stairs. Came to [facility] where work up showed multiple rib fractures, T9 fracture, and multiple transverse process fractures. Neurosurgical input requested.  Pt intubated 2/16 and finally extubated 2/18.   PT Comments   Improving daily.  Initiating bed mobility more often.  Still with tentative gait, but smoothing out as pain decreases.  Still could use short-term rehab due to wife works.   Follow Up Recommendations  Other (comment);CIR     Does the patient have the potential to tolerate intense rehabilitation     Barriers to Discharge        Equipment Recommendations  Other (comment) (TBA)    Recommendations for Other Services    Frequency Min 5X/week   Progress towards PT Goals Progress towards PT goals: Progressing toward goals  Plan Current plan remains appropriate    Precautions / Restrictions Precautions Precautions: Fall;Back Required Braces or Orthoses: Spinal Brace Spinal Brace: Thoracolumbosacral orthotic   Pertinent Vitals/Pain VSS, sats in the 90's on 2L and EHR in the 90's    Mobility  Bed Mobility Overal bed mobility: Needs Assistance Bed Mobility: Rolling;Sidelying to Sit Rolling: Mod assist;+2 for safety/equipment Sidelying to sit: Mod assist;+2 for safety/equipment General bed mobility comments: cuing for technique, truncal assist  discussion of back precautions as related to bed mobility. Transfers Overall transfer level: Needs assistance Transfers: Sit to/from Stand Sit to Stand: Min assist Stand pivot transfers: Min assist General transfer comment: MINIMAL LIFT ASSSIT Ambulation/Gait Ambulation/Gait assistance: Min guard Ambulation Distance (Feet): 210  Feet Assistive device: Rolling walker (2 wheeled) Gait Pattern/deviations: Step-through pattern;Decreased step length - right;Decreased step length - left;Decreased stride length Gait velocity: slow Gait velocity interpretation: Below normal speed for age/gender    Exercises     PT Diagnosis:    PT Problem List:   PT Treatment Interventions:     PT Goals (current goals can now be found in the care plan section) Acute Rehab PT Goals Patient Stated Goal: home independent so wife can work PT Goal Formulation: With patient Time For Goal Achievement: 10/13/13 Potential to Achieve Goals: Good  Visit Information  Last PT Received On: 10/03/13 Assistance Needed: +2 (safety only) History of Present Illness: The patient is 68 yo male involved in fall down stairs. Came to [facility] where work up showed multiple rib fractures, T9 fracture, and multiple transverse process fractures. Neurosurgical input requested.  Pt intubated 2/16 and finally extubated 2/18.    Subjective Data  Patient Stated Goal: home independent so wife can work   Copywriter, advertisingCognition  Cognition Arousal/Alertness: Awake/alert Behavior During Therapy: WFL for tasks assessed/performed Overall Cognitive Status: Within Functional Limits for tasks assessed    Balance  Balance Overall balance assessment: No apparent balance deficits (not formally assessed) Standing balance-Leahy Scale: Fair  End of Session PT - End of Session Equipment Utilized During Treatment: Back brace;Oxygen Activity Tolerance: Patient tolerated treatment well;Patient limited by pain Patient left: in chair;with call bell/phone within reach;with nursing/sitter in room Nurse Communication: Mobility status;Precautions   GP     Philip Ford, Philip Ford 10/03/2013, 12:36 PM 10/03/2013  [place] Philip Ford Philip Ford, PT [phone]  (pager)

## 2013-10-03 NOTE — Progress Notes (Signed)
Patient ID: Philip Ford, male   DOB: Dec 25, 1945, 68 y.o.   MRN: [id number]    Subjective: Up in chair, working hard on clearing secretions  Objective: Vital signs in last 24 hours: Temp:  [97.5 F (36.4 C)-98.5 F (36.9 C)] 98.5 F (36.9 C) (02/19 0735) Pulse Rate:  [71-97] 76 (02/19 0600) Resp:  [15-24] 18 (02/19 0600) BP: (102-184)/(40-102) 131/64 mmHg (02/19 0600) SpO2:  [90 %-100 %] 98 % (02/19 0745) FiO2 (%):  [40 %] 40 % (02/18 0854) Weight:  [220 lb 14.4 oz (100.2 kg)] 220 lb 14.4 oz (100.2 kg) (02/19 0500) Last BM Date: 10/01/13  Intake/Output from previous day: 02/18 0701 - 02/19 0700 In: 1225 [I.V.:420; NG/GT:55; IV Piggyback:750] Out: 1650 [Urine:1650] Intake/Output this shift:    General appearance: alert and cooperative Resp: mild wheeze B Chest wall: left sided chest wall tenderness Cardio: regular rate and rhythm GI: soft, NT, +BS Extremities: calves soft Neuro: A&O. MAE. F/C. speech fluent  Lab Results: CBC   Recent Labs  10/02/13 0500 10/03/13 0430  WBC 8.1 8.7  HGB 8.8* 9.7*  HCT 26.0* 29.1*  PLT 122* 167   BMET  Recent Labs  10/02/13 0500 10/03/13 0430  NA 141 140  K 3.6* 3.8  CL 98 97  CO2 34* 32  GLUCOSE 163* 104*  BUN 22 26*  CREATININE 1.05 1.16  CALCIUM 8.3* 8.6   PT/INR No results found for this basename: LABPROT, INR,  in the last 72 hours ABG  Recent Labs  10/02/13 0429 10/02/13 1014  PHART 7.456* 7.463*  HCO3 32.1* 34.7*    Studies/Results: Dg Chest Port 1 View  10/03/2013   CLINICAL DATA:  Left-sided pleural effusion  EXAM: PORTABLE CHEST - 1 VIEW  COMPARISON:  10/02/2013  FINDINGS: A right-sided PICC line is again noted at the cavoatrial junction. The endotracheal tube is been removed. A nasogastric catheter is been removed is well. The right lung is well aerated. The left hemi thorax demonstrates persistent pleural effusion and retrocardiac opacity. No new focal abnormality is seen.  IMPRESSION: Stable changes  in the left base.  No new focal abnormality is seen.   Electronically Signed   By: Alcide CleverMark  Lukens M.D.   On: 10/03/2013 07:24   Dg Chest Port 1 View  10/02/2013   CLINICAL DATA:  Check endotracheal tube.  EXAM: PORTABLE CHEST - 1 VIEW  COMPARISON:  DG CHEST 1V PORT dated 10/01/2013; CT T SPINE W/O CM dated 09/28/2013; DG CHEST 1V PORT dated 09/30/2013; DG CHEST 1V PORT dated 09/27/2013  FINDINGS: Endotracheal tube tip projects 4.9 cm above the carina. Right PICC in place, distal tip projecting at cavoatrial junction. Nasogastric tube past the gastroesophageal junction, distal tip not imaged.  The cardiac silhouette remains mildly enlarged, increasing central pulmonary vasculature congestion with suspected small to moderate layering left pleural effusion. Patchy retrocardiac airspace opacity. Again seen are left third through seventh posterior rib fractures. No pneumothorax. Multiple EKG lines overlie the patient and may obscure subtle underlying pathology.  IMPRESSION: No apparent change in position of life-support lines.  Increasing left pleural effusion, with multiple left posterior rib fractures, this could reflect hemothorax without pneumothorax. Worsening retrocardiac consolidation.   Electronically Signed   By: Awilda Metroourtnay  Bloomer   On: 10/02/2013 06:04    Anti-infectives: Anti-infectives   Start     Dose/Rate Route Frequency Ordered Stop   09/30/13 0800  vancomycin (VANCOCIN) IVPB 1000 mg/200 mL premix     1,000 mg 200 mL/hr over  60 Minutes Intravenous Every 8 hours 09/30/13 0149     09/30/13 0800  piperacillin-tazobactam (ZOSYN) IVPB 3.375 g     3.375 g 12.5 mL/hr over 240 Minutes Intravenous Every 8 hours 09/30/13 0149     09/30/13 0200  vancomycin (VANCOCIN) IVPB 1000 mg/200 mL premix     1,000 mg 200 mL/hr over 60 Minutes Intravenous  Once 09/30/13 0149 09/30/13 0341   09/30/13 0200  piperacillin-tazobactam (ZOSYN) IVPB 3.375 g     3.375 g 100 mL/hr over 30 Minutes Intravenous  Once 09/30/13  0149 09/30/13 1610      Assessment/Plan: Fall  Scalp lac  L 3-11 rib fx -- Pulmonary toilet Mult thoracic TVP fxs  T9 vertebral body fx -- TLSO  L1 TVP fx  ABL anemia - improved ARF -- tolerated extubation, working hard on clearing secretions 1250cc IS, cont. Scheduled BDs ID -- Vanc/Zosyn D#3 empiric, CXs neg so far, WBC stable and afeb, D/C ABX if final CXs neg Alcohol abuse -- CIWA protocol, vitamins  HTN -- Improved FEN - mild hypokalemia improved VTE - SCD's, Lovenox  Dispo -- Continue ICU   LOS: 6 days    Violeta Gelinas, MD, MPH, FACS Pager: [phone]  10/03/2013

## 2013-10-03 NOTE — Consult Note (Signed)
Physical Medicine and Rehabilitation Consult Reason for Consult: Fall/multitrauma Referring Physician: Trauma services   HPI: Philip Ford is a 68 y.o. right-handed male with history of alcohol abuse and recently discharged from [facility] alcohol treatment program and had been sober for the last 6 weeks. Admitted 09/28/2013 after a fall down stairs. CT of the head showed left parietal scalp contusion/laceration. No acute intracranial process. 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 Patient sustained multi-left rib fractures, multiple thoracic transverse process fractures, T9 vertebral body fracture, L1 transverse process fracture. Neurosurgery Dr. Gerlene Fee consulted and felt fractures to be stable placed in a TLSO brace. C-spine CT scan negative in cervical collar removed. Noted EtOH level 218 upon admission. Patient did remain intubated for a short time extubated 10/02/2013. Subcutaneous Lovenox added for DVT prophylaxis 09/30/2013. Physical therapy evaluation completed with recommendations for physical medicine rehabilitation consult   Review of Systems  Gastrointestinal: Positive for constipation.       GERD  Musculoskeletal: Positive for falls.  All other systems reviewed and are negative.   Past Medical History  Diagnosis Date  . Coronary artery disease   . Hypertension    Past Surgical History  Procedure Laterality Date  . Coronary stent placement     History reviewed. No pertinent family history. Social History:  reports that he has been smoking.  He does not have any smokeless tobacco history on file. He reports that he drinks alcohol. He reports that he does not use illicit drugs. Allergies: No Known Allergies Medications Prior to Admission  Medication Sig Dispense Refill  . aspirin EC 81 MG tablet Take 81 mg by mouth daily.      . hydrochlorothiazide (HYDRODIURIL) 25 MG tablet Take 1 tablet by mouth daily.      Marland Kitchen lisinopril (PRINIVIL,ZESTRIL) 10 MG  tablet Take 1 tablet by mouth daily.      Marland Kitchen omeprazole (PRILOSEC) 20 MG capsule Take 20 mg by mouth daily.        Home: Home Living Family/patient expects to be discharged to:: Private residence Living Arrangements: Spouse/significant other Available Help at Discharge: Family;Other (Comment) (wife works, how much she can assist TBA) Type of Home: House Home Access: Stairs to enter Secretary/administrator of Steps: 1 Home Layout: Two level Alternate Level Stairs-Number of Steps: flight Alternate Level Stairs-Rails: Right;Left Home Equipment: Other (comment) (pt thinks he has RW BSC, and tub seat)  Functional History:   Functional Status:  Mobility:          ADL:    Cognition: Cognition Overall Cognitive Status: Within Functional Limits for tasks assessed Orientation Level: Oriented X4 Cognition Arousal/Alertness: Awake/alert Behavior During Therapy: WFL for tasks assessed/performed Overall Cognitive Status: Within Functional Limits for tasks assessed  Blood pressure 133/62, pulse 78, temperature 98.3 F (36.8 C), temperature source Oral, resp. rate 24, height 5\' 11"  (1.803 m), weight 100.2 kg (220 lb 14.4 oz), SpO2 95.00%. Physical Exam  Vitals reviewed. Constitutional: He is oriented to person, place, and time. He appears well-developed.  Eyes: EOM are normal.  Neck: Normal range of motion. Neck supple. No thyromegaly present.  Cardiovascular: Normal rate and regular rhythm.   Respiratory:  Lungs decreased breath sounds at the bases but clear to auscultation  GI: Soft. Bowel sounds are normal. He exhibits no distension.  Musculoskeletal:  Wearing TLSO---back tender as a whole. Able to maintain fair sitting balance while in brace  Neurological: He is alert and oriented to person, place, and  time. No cranial nerve deficit.  Patient made good eye contact with examiner. He would initiate conversation. He followed commands. UE's 4/5. LE's 4-HF, 4 KE and ankles. No sensory  loss. Reasonable insight and awareness.   Skin: Skin is warm and dry.  Psychiatric: He has a normal mood and affect. His behavior is normal. Thought content normal.    Results for orders placed during the hospital encounter of 09/27/13 (from the past 24 hour(s))  GLUCOSE, CAPILLARY     Status: Abnormal   Collection Time    10/02/13  7:33 AM      Result Value Ref Range   Glucose-Capillary 153 (*) 70 - 99 mg/dL   Comment 1 Notify RN    POCT I-STAT 3, BLOOD GAS (G3+)     Status: Abnormal   Collection Time    10/02/13 10:14 AM      Result Value Ref Range   pH, Arterial 7.463 (*) 7.350 - 7.450   pCO2 arterial 48.4 (*) 35.0 - 45.0 mmHg   pO2, Arterial 90.0  80.0 - 100.0 mmHg   Bicarbonate 34.7 (*) 20.0 - 24.0 mEq/L   TCO2 36  0 - 100 mmol/L   O2 Saturation 97.0     Acid-Base Excess 10.0 (*) 0.0 - 2.0 mmol/L   Patient temperature 98.3 F     Collection site RADIAL, ALLEN'S TEST ACCEPTABLE     Drawn by Operator     Sample type ARTERIAL    GLUCOSE, CAPILLARY     Status: None   Collection Time    10/02/13 12:23 PM      Result Value Ref Range   Glucose-Capillary 99  70 - 99 mg/dL  GLUCOSE, CAPILLARY     Status: Abnormal   Collection Time    10/02/13  3:44 PM      Result Value Ref Range   Glucose-Capillary 100 (*) 70 - 99 mg/dL   Comment 1 Notify RN    GLUCOSE, CAPILLARY     Status: Abnormal   Collection Time    10/02/13  7:21 PM      Result Value Ref Range   Glucose-Capillary 158 (*) 70 - 99 mg/dL   Comment 1 Documented in Chart     Comment 2 Notify RN    GLUCOSE, CAPILLARY     Status: None   Collection Time    10/02/13 11:52 PM      Result Value Ref Range   Glucose-Capillary 72  70 - 99 mg/dL   Comment 1 Documented in Chart     Comment 2 Notify RN    GLUCOSE, CAPILLARY     Status: None   Collection Time    10/03/13  3:18 AM      Result Value Ref Range   Glucose-Capillary 93  70 - 99 mg/dL   Comment 1 Documented in Chart     Comment 2 Notify RN    CBC WITH DIFFERENTIAL      Status: Abnormal   Collection Time    10/03/13  4:30 AM      Result Value Ref Range   WBC 8.7  4.0 - 10.5 K/uL   RBC 3.03 (*) 4.22 - 5.81 MIL/uL   Hemoglobin 9.7 (*) 13.0 - 17.0 g/dL   HCT 62.929.1 (*) 52.839.0 - 41.352.0 %   MCV 96.0  78.0 - 100.0 fL   MCH 32.0  26.0 - 34.0 pg   MCHC 33.3  30.0 - 36.0 g/dL   RDW 24.412.7  01.011.5 -  15.5 %   Platelets 167  150 - 400 K/uL   Neutrophils Relative % 62  43 - 77 %   Neutro Abs 5.4  1.7 - 7.7 K/uL   Lymphocytes Relative 15  12 - 46 %   Lymphs Abs 1.3  0.7 - 4.0 K/uL   Monocytes Relative 15 (*) 3 - 12 %   Monocytes Absolute 1.3 (*) 0.1 - 1.0 K/uL   Eosinophils Relative 7 (*) 0 - 5 %   Eosinophils Absolute 0.6  0.0 - 0.7 K/uL   Basophils Relative 1  0 - 1 %   Basophils Absolute 0.0  0.0 - 0.1 K/uL  BASIC METABOLIC PANEL     Status: Abnormal   Collection Time    10/03/13  4:30 AM      Result Value Ref Range   Sodium 140  137 - 147 mEq/L   Potassium 3.8  3.7 - 5.3 mEq/L   Chloride 97  96 - 112 mEq/L   CO2 32  19 - 32 mEq/L   Glucose, Bld 104 (*) 70 - 99 mg/dL   BUN 26 (*) 6 - 23 mg/dL   Creatinine, Ser 1.61  0.50 - 1.35 mg/dL   Calcium 8.6  8.4 - 09.6 mg/dL   GFR calc non Af Amer 64 (*) >90 mL/min   GFR calc Af Amer 74 (*) >90 mL/min   Dg Chest Port 1 View  10/02/2013   CLINICAL DATA:  Check endotracheal tube.  EXAM: PORTABLE CHEST - 1 VIEW  COMPARISON:  DG CHEST 1V PORT dated 10/01/2013; CT T SPINE W/O CM dated 09/28/2013; DG CHEST 1V PORT dated 09/30/2013; DG CHEST 1V PORT dated 09/27/2013  FINDINGS: Endotracheal tube tip projects 4.9 cm above the carina. Right PICC in place, distal tip projecting at cavoatrial junction. Nasogastric tube past the gastroesophageal junction, distal tip not imaged.  The cardiac silhouette remains mildly enlarged, increasing central pulmonary vasculature congestion with suspected small to moderate layering left pleural effusion. Patchy retrocardiac airspace opacity. Again seen are left third through seventh posterior rib  fractures. No pneumothorax. Multiple EKG lines overlie the patient and may obscure subtle underlying pathology.  IMPRESSION: No apparent change in position of life-support lines.  Increasing left pleural effusion, with multiple left posterior rib fractures, this could reflect hemothorax without pneumothorax. Worsening retrocardiac consolidation.   Electronically Signed   By: Awilda Metro   On: 10/02/2013 06:04   Dg Chest Port 1 View  10/01/2013   CLINICAL DATA:  Hypoxia  EXAM: PORTABLE CHEST - 1 VIEW  COMPARISON:  September 30, 2013  FINDINGS: Endotracheal tube tip is 6.5 cm above the carina. Central catheter tip is at the cavoatrial junction. Nasogastric tube tip and side port are below the diaphragm. No pneumothorax.  There is consolidation in the left base with small left effusion. Right lung is clear. Heart is mildly enlarged with normal pulmonary vascularity, stable. No adenopathy.  IMPRESSION: Tube and catheter positions as described without pneumothorax. Infiltrate left base with small left effusion, stable. No new opacity. No change in cardiac silhouette.   Electronically Signed   By: Bretta Bang M.D.   On: 10/01/2013 07:52    Assessment/Plan: Diagnosis: Thoracic, lumbar and rib fx's after fall 1. Does the need for close, 24 hr/day medical supervision in concert with the patient's rehab needs make it unreasonable for this patient to be served in a less intensive setting? Yes 2. Co-Morbidities requiring supervision/potential complications: HTN, ABLA, etoh abuse 3. Due to bladder  management, bowel management, safety, skin/wound care, disease management, medication administration, pain management and patient education, does the patient require 24 hr/day rehab nursing? Yes 4. Does the patient require coordinated care of a physician, rehab nurse, PT (1-2 hrs/day, 5 days/week) and OT (1-2 hrs/day, 5 days/week) to address physical and functional deficits in the context of the above medical  diagnosis(es)? Yes Addressing deficits in the following areas: balance, endurance, locomotion, strength, transferring, bowel/bladder control, bathing, dressing, feeding, grooming, toileting and psychosocial support 5. Can the patient actively participate in an intensive therapy program of at least 3 hrs of therapy per day at least 5 days per week? Yes 6. The potential for patient to make measurable gains while on inpatient rehab is excellent 7. Anticipated functional outcomes upon discharge from inpatient rehab are mod I with PT, min assist to mod I with OT, n/a with SLP. 8. Estimated rehab length of stay to reach the above functional goals is: 7-10 days 9. Does the patient have adequate social supports to accommodate these discharge functional goals? Yes 10. Anticipated D/C setting: Home 11. Anticipated post D/C treatments: HH therapy 12. Overall Rehab/Functional Prognosis: excellent  RECOMMENDATIONS: This patient's condition is appropriate for continued rehabilitative care in the following setting: CIR Patient has agreed to participate in recommended program. Yes Note that insurance prior authorization may be required for reimbursement for recommended care.  Comment: Rehab Admissions Coordinator to follow up.  Thanks,  Ranelle Oyster, MD, [place] [facility]     10/03/2013

## 2013-10-03 NOTE — Progress Notes (Signed)
ANTIBIOTIC CONSULT NOTE - Follow-up  Pharmacy Consult for Vancomycin and Zosyn Indication: rule out pneumonia  No Known Allergies  Patient Measurements: Height: 5\' 11"  (180.3 cm) Weight: 220 lb 14.4 oz (100.2 kg) IBW/kg (Calculated) : 75.3  Vital Signs: Temp: 98.5 F (36.9 C) (02/19 0735) Temp src: Oral (02/19 0735) BP: 131/64 mmHg (02/19 0600) Pulse Rate: 76 (02/19 0600) Intake/Output from previous day: 02/18 0701 - 02/19 0700 In: 1225 [I.V.:420; NG/GT:55; IV Piggyback:750] Out: 1650 [Urine:1650] Intake/Output from this shift:    Labs:  Recent Labs  10/01/13 0435 10/02/13 0500 10/03/13 0430  WBC 10.2 8.1 8.7  HGB 9.2* 8.8* 9.7*  PLT 114* 122* 167  CREATININE 1.04 1.05 1.16   Estimated Creatinine Clearance: 75.6 ml/min (by C-G formula based on Cr of 1.16).   Microbiology: Recent Results (from the past 720 hour(s))  MRSA PCR SCREENING     Status: None   Collection Time    09/28/13  4:05 AM      Result Value Ref Range Status   MRSA by PCR NEGATIVE  NEGATIVE Final   Comment:            The GeneXpert MRSA Assay (FDA     approved for NASAL specimens     only), is one component of a     comprehensive MRSA colonization     surveillance program. It is not     intended to diagnose MRSA     infection nor to guide or     monitor treatment for     MRSA infections.  CULTURE, BAL-QUANTITATIVE     Status: None   Collection Time    09/30/13  9:25 AM      Result Value Ref Range Status   Specimen Description BRONCHIAL ALVEOLAR LAVAGE   Final   Special Requests NONE   Final   Gram Stain     Final   Value: NO WBC SEEN     NO SQUAMOUS EPITHELIAL CELLS SEEN     NO ORGANISMS SEEN     [facility]formed [facility]   Colony Count     Final   Value: NO GROWTH     [facility]formed at [facility]   Culture     Final   Value: NO GROWTH 2 DAYS     Performed at [facility]   Report Status 10/02/2013 FINAL   Final  CULTURE, BLOOD (ROUTINE X 2)     Status: None    Collection Time    10/01/13  9:55 AM      Result Value Ref Range Status   Specimen Description BLOOD LEFT HAND   Final   Special Requests BOTTLES DRAWN AEROBIC ONLY 10CC   Final   Culture  Setup Time     Final   Value: 10/01/2013 14:01     Performed at [facility]   Culture     Final   Value:        BLOOD CULTURE RECEIVED NO GROWTH TO DATE CULTURE WILL BE HELD FOR 5 DAYS BEFORE ISSUING A FINAL NEGATIVE REPORT     Performed at [facility]   Report Status PENDING   Incomplete  CULTURE, BLOOD (ROUTINE X 2)     Status: None   Collection Time    10/01/13 10:05 AM      Result Value Ref Range Status   Specimen Description BLOOD LEFT ANTECUBITAL   Final   Special Requests BOTTLES DRAWN AEROBIC AND ANAEROBIC 10CC  Final   Culture  Setup Time     Final   Value: 10/01/2013 14:01     Performed at [facility]   Culture     Final   Value:        BLOOD CULTURE RECEIVED NO GROWTH TO DATE CULTURE WILL BE HELD FOR 5 DAYS BEFORE ISSUING A FINAL NEGATIVE REPORT     Performed at [facility]   Report Status PENDING   Incomplete  URINE CULTURE     Status: None   Collection Time    10/01/13 10:11 AM      Result Value Ref Range Status   Specimen Description URINE, CATHETERIZED   Final   Special Requests NONE   Final   Culture  Setup Time     Final   Value: 10/01/2013 11:20     Performed at [facility] Count     Final   Value: NO GROWTH     Performed at [facility]   Culture     Final   Value: NO GROWTH     Performed at [facility]   Report Status 10/02/2013 FINAL   Final    Assessment: 67yo male admitted 2/14 s/p mechanical fall w/ multiple fractures, now intubated for acute resp failute and poor mgmt of secretions. Pt continues on Vancomycin and Zosyn (Day #4) for presumed PNA. Noted plans to d/c antibiotics if cx remain negative. Afeb. Wbc wnl. Renal function remains stable. UOP 0.7 ml/kg/hr. Vancomycin trough 18.9  mcg/ml (therapeutic) on 1gm IV q8h.  2/16 Vanc>> 2/16 Zosyn>>  2/16 BAL>>neg 2/17 Bld x 2>>ngtd 2/17 Urine>>neg MRSA PCR neg   Goal of Therapy: Vancomycin trough level 15-20 mcg/ml  Plan:  1) Contineu Vancomycin 1g IV Q8H  2) Continue Zosyn 3.375g IV Q8H  3) Monitor CBC, Cx, level prn.  Christoper Fabian, PharmD, BCPS Clinical pharmacist, pager [phone] 10/03/2013,9:14 AM

## 2013-10-04 ENCOUNTER — Inpatient Hospital Stay (HOSPITAL_COMMUNITY): Payer: Medicare Other

## 2013-10-04 DIAGNOSIS — N179 Acute kidney failure, unspecified: Secondary | ICD-10-CM

## 2013-10-04 LAB — GLUCOSE, CAPILLARY
Glucose-Capillary: 120 mg/dL — ABNORMAL HIGH (ref 70–99)
Glucose-Capillary: 98 mg/dL (ref 70–99)
Glucose-Capillary: 121 mg/dL — ABNORMAL HIGH (ref 70–99)
Glucose-Capillary: 150 mg/dL — ABNORMAL HIGH (ref 70–99)
Glucose-Capillary: 97 mg/dL (ref 70–99)

## 2013-10-04 LAB — CBC
HCT: 28.6 % — ABNORMAL LOW (ref 39.0–52.0)
Hemoglobin: 9.6 g/dL — ABNORMAL LOW (ref 13.0–17.0)
MCH: 32 pg (ref 26.0–34.0)
MCHC: 33.6 g/dL (ref 30.0–36.0)
MCV: 95.3 fL (ref 78.0–100.0)
Platelets: 181 10*3/uL (ref 150–400)
RBC: 3 MIL/uL — ABNORMAL LOW (ref 4.22–5.81)
RDW: 12.5 % (ref 11.5–15.5)
WBC: 7.5 10*3/uL (ref 4.0–10.5)

## 2013-10-04 LAB — BASIC METABOLIC PANEL
BUN: 28 mg/dL — ABNORMAL HIGH (ref 6–23)
CO2: 32 mEq/L (ref 19–32)
Calcium: 8.5 mg/dL (ref 8.4–10.5)
Chloride: 99 mEq/L (ref 96–112)
Creatinine, Ser: 1.3 mg/dL (ref 0.50–1.35)
GFR calc Af Amer: 64 mL/min — ABNORMAL LOW (ref >90)
GFR calc non Af Amer: 56 mL/min — ABNORMAL LOW (ref >90)
Glucose, Bld: 107 mg/dL — ABNORMAL HIGH (ref 70–99)
Potassium: 4.1 mEq/L (ref 3.7–5.3)
Sodium: 141 mEq/L (ref 137–147)

## 2013-10-04 MED ORDER — VITAMIN B-1 100 MG PO TABS
100.0000 mg | ORAL_TABLET | Freq: Every day | ORAL | Status: DC
  Administered 2013-10-05 – 2013-10-06 (×2): 100 mg via ORAL
  Filled 2013-10-04 (×2): qty 1

## 2013-10-04 MED ORDER — PRO-STAT SUGAR FREE PO LIQD
30.0000 mL | Freq: Two times a day (BID) | ORAL | Status: DC
  Administered 2013-10-04 – 2013-10-06 (×4): 30 mL via ORAL
  Filled 2013-10-04 (×5): qty 30

## 2013-10-04 NOTE — Progress Notes (Signed)
UR completed. Insurance has denied CIR.   Carlyle LipaMichelle Andjela Wickes, RN BSN MHA CCM Trauma/Neuro ICU Case Manager [phone]

## 2013-10-04 NOTE — Progress Notes (Signed)
Physical Therapy Treatment Patient Details Name: Iran PlanasDonald W Baysinger MRN: [id number] DOB: 09/09/1945 Today's Date: 10/04/2013 Time: [id number] PT Time Calculation (min): 28 min  PT Assessment / Plan / Recommendation  History of Present Illness The patient is 68 yo male involved in fall down stairs. Came to [place] where work up showed multiple left rib fractures, T9 fracture, and multiple transverse process fractures. Neurosurgical input requested.  Pt intubated 2/16 and finally extubated 2/18.   PT Comments   Pt progressing well and should be able to d/c home with family support over next several days. Initiated education on donning TLSO in sitting now that this has been updated. Pt still unsure what equipment he has at home.   Follow Up Recommendations  Home health PT;Supervision - Intermittent     Does the patient have the potential to tolerate intense rehabilitation     Barriers to Discharge        Equipment Recommendations  Other (comment) (pt thinks he has some equipment at home, including RW)    Recommendations for Other Services    Frequency Min 3X/week   Progress towards PT Goals Progress towards PT goals: Progressing toward goals  Plan Discharge plan needs to be updated    Precautions / Restrictions Precautions Precautions: Fall;Back Required Braces or Orthoses: Spinal Brace Spinal Brace: Thoracolumbosacral orthotic;Applied in sitting position   Pertinent Vitals/Pain SaO2 92% on 1L at rest Walking on 1L decr to 88% Walking on 2L 91%    Mobility  Bed Mobility Overal bed mobility: Needs Assistance Bed Mobility: Rolling;Sidelying to Sit Rolling: Min assist Sidelying to sit: Min assist;HOB elevated General bed mobility comments: pt recalled proper technique; min assist to complete roll and raise trunk to sit Transfers Overall transfer level: Needs assistance Equipment used: Rolling walker (2 wheeled) Transfers: Sit to/from Stand Sit to Stand: Min guard General  transfer comment: vc for safe use of RW (however pt continues to place hands on RW to come to stand. RW does not tip as he applies almost no pressure on it) Ambulation/Gait Ambulation/Gait assistance: Min guard;+2 safety/equipment Ambulation Distance (Feet): 250 Feet Assistive device: Rolling walker (2 wheeled) Gait Pattern/deviations: WFL(Within Functional Limits) General Gait Details: better velocity; vc initially to keep RW closer to his body    Exercises     PT Diagnosis:    PT Problem List:   PT Treatment Interventions:     PT Goals (current goals can now be found in the care plan section) Acute Rehab PT Goals Patient Stated Goal: home independent so wife can work  Visit Information  Last PT Received On: 10/04/13 Assistance Needed: +2 (safety only) History of Present Illness: The patient is 68 yo male involved in fall down stairs. Came to [place] where work up showed multiple left rib fractures, T9 fracture, and multiple transverse process fractures. Neurosurgical input requested.  Pt intubated 2/16 and finally extubated 2/18.    Subjective Data  Patient Stated Goal: home independent so wife can work   Copywriter, advertisingCognition  Cognition Arousal/Alertness: Awake/alert Behavior During Therapy: WFL for tasks assessed/performed Overall Cognitive Status: Within Functional Limits for tasks assessed    Balance  Balance Standing balance-Leahy Scale: Fair  End of Session PT - End of Session Equipment Utilized During Treatment: Back brace;Oxygen Activity Tolerance: Patient tolerated treatment well Patient left: in chair;with call bell/phone within reach;with nursing/sitter in room Nurse Communication: Mobility status (need for O2 with walking)   GP     Haille Pardi 10/04/2013, 4:27 PM Pager [phone]

## 2013-10-04 NOTE — Plan of Care (Signed)
Problem: Phase II Progression Outcomes Goal: Discharge plan established Outcome: Completed/Met Date Met:  10/04/13 Home with wife and maybe [place]

## 2013-10-04 NOTE — Clinical Social Work Note (Signed)
Clinical Social Work Department BRIEF PSYCHOSOCIAL ASSESSMENT 10/04/2013  Patient:  Philip Ford, Philip Ford     Account Number:  [id number]     [place] date:  09/27/2013  Clinical Social Worker:  Myles Lipps  Date/Time:  10/04/2013 02:30 PM  Referred by:  Physician  Date Referred:  10/04/2013 Referred for  Psychosocial assessment  Substance Abuse   Other Referral:   Interview type:  Patient Other interview type:   No friends/family at bedside    PSYCHOSOCIAL DATA Living Status:  WIFE Admitted from facility:   Level of care:   Primary support name:  AMOR, PACKARD  [phone] Primary support relationship to patient:  SPOUSE Degree of support available:   Strong    CURRENT CONCERNS Current Concerns  None Noted   Other Concerns:    SOCIAL WORK ASSESSMENT / PLAN Clinical Social Worker met with patient at bedside to offer support and discuss patient needs at discharge.  Patient states that he was going down a set of stairs and tripped over a cord that runs along the threshold of the doorway. Patient has no recollection of the fall after tripping. Patient currently lives at home with his wife who works Wed. - Saturday and plans to return home with her once medically ready.  PT/OT feel patient can safely return home with the intermittent supervision and home health.    Clinical Social Worker inquired about patient current substance use.  Patient states "I'm not willing to admit if I had substances on board when I fell.  If I tell you it may incriminate me."  CSW explained to patient that CSW was not there to be punitive regarding his current use just to offer resources as needed.  Patient states that this event was enough to scare him and he will not be drinking again once discharged.  Per RN, patient states that patient has been to rehab in the past and had successful periods of sobriety.  SBIRT completed.  Resources declined at this time.  CSW signing off at this time.  Please reconsult if  social work needs arise prior to discharge.   Assessment/plan status:  No Further Intervention Required Other assessment/ plan:   Information/referral to community resources:   Holiday representative offered patient additional substance use resources, however patient politely declined.    PATIENT'S/FAMILY'S RESPONSE TO PLAN OF CARE: Patient alert and oriented x3 sitting up in bed.  Patient states that with patient wife, his 2 neighbors, and his children he has adequate support for discharge home. Patient with a very jovial demeanor and motivated to work with therapies.  Patient understanding of CSW role and appreciative for support and involvement.

## 2013-10-04 NOTE — Progress Notes (Signed)
Subjective: No complaints  Objective: Vital signs in last 24 hours: Temp:  [97.6 F (36.4 C)-98.2 F (36.8 C)] 97.8 F (36.6 C) (02/20 0754) Pulse Rate:  [41-118] 65 (02/20 0600) Resp:  [14-23] 20 (02/20 0700) BP: (101-148)/(45-103) 147/83 mmHg (02/20 0700) SpO2:  [95 %-100 %] 100 % (02/20 0718) Weight:  [224 lb 6.9 oz (101.8 kg)] 224 lb 6.9 oz (101.8 kg) (02/20 0500) Last BM Date: 10/01/13  Intake/Output from previous day: 02/19 0701 - 02/20 0700 In: 3050 [P.O.:1560; I.V.:540; IV Piggyback:950] Out: 2800 [Urine:2800] Intake/Output this shift:    Resp: clear to auscultation bilaterally Cardio: regular rate and rhythm GI: soft, nontender. tolerating diet  Lab Results:   Recent Labs  10/03/13 0430 10/04/13 0445  WBC 8.7 7.5  HGB 9.7* 9.6*  HCT 29.1* 28.6*  PLT 167 181   BMET  Recent Labs  10/03/13 1600 10/04/13 0445  NA 138 141  K 4.0 4.1  CL 95* 99  CO2 32 32  GLUCOSE 129* 107*  BUN 25* 28*  CREATININE 1.11 1.30  CALCIUM 8.6 8.5   PT/INR No results found for this basename: LABPROT, INR,  in the last 72 hours ABG  Recent Labs  10/02/13 0429 10/02/13 1014  PHART 7.456* 7.463*  HCO3 32.1* 34.7*    Studies/Results: Dg Chest Port 1 View  10/04/2013   CLINICAL DATA:  Trauma.  EXAM: PORTABLE CHEST - 1 VIEW  COMPARISON:  DG CHEST 1V PORT dated 10/03/2013; DG CHEST 1V PORT dated 10/02/2013  FINDINGS: The patient has been extubated. Nasogastric tube has been removed. PICC line positioning is stable. There is stable atelectasis/ consolidation of the left lower lobe and probable left pleural fluid. No edema or pneumothorax is identified. Multiple displaced left rib fractures are again seen.  IMPRESSION: Stable left lower lobe consolidation/atelectasis.  No pneumothorax.   Electronically Signed   By: Irish LackGlenn  Yamagata M.D.   On: 10/04/2013 07:55   Dg Chest Port 1 View  10/03/2013   CLINICAL DATA:  Left-sided pleural effusion  EXAM: PORTABLE CHEST - 1 VIEW   COMPARISON:  10/02/2013  FINDINGS: A right-sided PICC line is again noted at the cavoatrial junction. The endotracheal tube is been removed. A nasogastric catheter is been removed is well. The right lung is well aerated. The left hemi thorax demonstrates persistent pleural effusion and retrocardiac opacity. No new focal abnormality is seen.  IMPRESSION: Stable changes in the left base.  No new focal abnormality is seen.   Electronically Signed   By: Alcide CleverMark  Lukens M.D.   On: 10/03/2013 07:24    Anti-infectives: Anti-infectives   Start     Dose/Rate Route Frequency Ordered Stop   09/30/13 0800  vancomycin (VANCOCIN) IVPB 1000 mg/200 mL premix     1,000 mg 200 mL/hr over 60 Minutes Intravenous Every 8 hours 09/30/13 0149     09/30/13 0800  piperacillin-tazobactam (ZOSYN) IVPB 3.375 g     3.375 g 12.5 mL/hr over 240 Minutes Intravenous Every 8 hours 09/30/13 0149     09/30/13 0200  vancomycin (VANCOCIN) IVPB 1000 mg/200 mL premix     1,000 mg 200 mL/hr over 60 Minutes Intravenous  Once 09/30/13 0149 09/30/13 0341   09/30/13 0200  piperacillin-tazobactam (ZOSYN) IVPB 3.375 g     3.375 g 100 mL/hr over 30 Minutes Intravenous  Once 09/30/13 0149 09/30/13 0311      Assessment/Plan: s/p * No surgery found * Advance diet  Assessment/Plan:  Fall  Scalp lac  L 3-11 rib  fx -- Pulmonary toilet  Mult thoracic TVP fxs  T9 vertebral body fx -- TLSO  L1 TVP fx  ABL anemia - improved  ARF -- tolerated extubation, working hard on clearing secretions 1250cc IS, cont. Scheduled BDs  ID -- Vanc/Zosyn D#4 empiric, CXs neg so far, WBC stable and afeb, D/C ABX if final CXs neg  Alcohol abuse -- CIWA protocol, vitamins  HTN -- Improved  FEN - mild hypokalemia improved  VTE - SCD's, Lovenox  Dispo -- may be ready to move out of icu soon   LOS: 7 days    TOTH III,Amma Crear S 10/04/2013

## 2013-10-04 NOTE — Progress Notes (Addendum)
Rehab admissions - Evaluated for possible admission.  Spoke with RN who says that patient ambulated twice around unit today.  Currently needing minguard assist to ambulate 210 ft.  RN says patient to transfer to stepdown unit.  May not need inpatient rehab by Monday.  Will follow progress over weekend and check back on Monday to determine needs for rehab.  Call me for questions.  #[phone]  I spoke with on site insurance reviewer for [employer].  Patient currently doing too well for acute inpatient rehab admission.  Likely he can discharge home with [facility] therapies over the next few days once he is medically ready.  Call me for questions.  #[phone]

## 2013-10-04 NOTE — Evaluation (Signed)
Occupational Therapy Evaluation Patient Details Name: Philip Ford MRN: [id number] DOB: 1946-08-01 Today's Date: 10/04/2013 Time: [id number] OT Time Calculation (min): 28 min  OT Assessment / Plan / Recommendation History of present illness The patient is 68 yo male involved in fall down stairs. Came to [place] where work up showed multiple left rib fractures, T9 fracture, and multiple transverse process fractures. Neurosurgical input requested.  Pt intubated 2/16 and finally extubated 2/18.   Clinical Impression   Pt admitted with above. He demonstrates the below listed deficits and will benefit from continued OT to maximize safety and independence with BADLs.  Pt demonstrates generalized weakness, decreased awareness of back precautions.  He requires mod A - max A overall for BADLs.  Recommend 24 hour supervision at discharge initially      OT Assessment  Patient needs continued OT Services    Follow Up Recommendations  No OT follow up;Supervision/Assistance - 24 hour    Barriers to Discharge      Equipment Recommendations  None recommended by OT    Recommendations for Other Services    Frequency  Min 2X/week    Precautions / Restrictions Precautions Precautions: Fall;Back Precaution Comments: Pt unable to recall back precautions. Pt was reinstructed Required Braces or Orthoses: Spinal Brace Spinal Brace: Thoracolumbosacral orthotic;Applied in sitting position   Pertinent Vitals/Pain     ADL  Eating/Feeding: Independent Where Assessed - Eating/Feeding: Chair Grooming: Wash/dry hands;Wash/dry face;Minimal assistance Where Assessed - Grooming: Supported standing Upper Body Bathing: Minimal assistance Where Assessed - Upper Body Bathing: Unsupported sitting Lower Body Bathing: Maximal assistance Where Assessed - Lower Body Bathing: Supported sit to stand Upper Body Dressing: Minimal assistance Where Assessed - Upper Body Dressing: Unsupported sitting Lower Body  Dressing: Maximal assistance Where Assessed - Lower Body Dressing: Supported sit to Pharmacist, hospitalstand Toilet Transfer: Minimal assistance Toilet Transfer Method: Sit to stand;Stand pivot AcupuncturistToilet Transfer Equipment: Comfort height toilet Toileting - Clothing Manipulation and Hygiene: Minimal assistance Where Assessed - Engineer, miningToileting Clothing Manipulation and Hygiene: Standing Equipment Used: Back brace Transfers/Ambulation Related to ADLs: min A ADL Comments: Pt unable to access bil. feet for LB ADLs.  He reports he has 3 reachers.   Pt requires min cues for back precautions    OT Diagnosis: Generalized weakness;Acute pain  OT Problem List: Decreased strength;Decreased activity tolerance;Impaired balance (sitting and/or standing);Decreased safety awareness;Decreased knowledge of use of DME or AE;Decreased knowledge of precautions;Pain OT Treatment Interventions: Self-care/ADL training;DME and/or AE instruction;Therapeutic activities;Patient/family education;Balance training   OT Goals(Current goals can be found in the care plan section) Acute Rehab OT Goals Patient Stated Goal: to regain independent OT Goal Formulation: With patient Time For Goal Achievement: 10/11/13 Potential to Achieve Goals: Good ADL Goals Pt Will Perform Grooming: with supervision;standing Pt Will Perform Upper Body Bathing: with supervision;sitting Pt Will Perform Lower Body Bathing: with supervision;sit to/from stand;with adaptive equipment Pt Will Perform Upper Body Dressing: with supervision;with adaptive equipment;sitting Pt Will Perform Lower Body Dressing: with supervision;with adaptive equipment;sit to/from stand Pt Will Transfer to Toilet: with supervision;ambulating;regular height toilet;grab bars Pt Will Perform Toileting - Clothing Manipulation and hygiene: with supervision;sit to/from stand;with adaptive equipment Pt Will Perform Tub/Shower Transfer: Tub transfer;with min guard assist;ambulating;shower seat;rolling  walker Additional ADL Goal #1: Pt will be independent with back precautions during all BADLs and functional mobility  Additional ADL Goal #2: Pt / wife will be independent with donning/doffing brace  Visit Information  Last OT Received On: 10/04/13 Assistance Needed: +1 History of Present Illness: The patient  is 68 yo male involved in fall down stairs. Came to [place] where work up showed multiple left rib fractures, T9 fracture, and multiple transverse process fractures. Neurosurgical input requested.  Pt intubated 2/16 and finally extubated 2/18.       Prior Functioning     Home Living Family/patient expects to be discharged to:: Private residence Living Arrangements: Spouse/significant other Available Help at Discharge: Family;Other (Comment) Type of Home: House Home Access: Stairs to enter Entrance Stairs-Number of Steps: 1 Home Layout: Two level Alternate Level Stairs-Number of Steps: flight Alternate Level Stairs-Rails: Right;Left Home Equipment: Other (comment);Adaptive equipment Adaptive Equipment: Reacher Additional Comments: Pt thinks he has RW, BSC, and tub seat Prior Function Level of Independence: Independent Communication Communication: No difficulties         Vision/Perception     Cognition  Cognition Arousal/Alertness: Awake/alert Behavior During Therapy: WFL for tasks assessed/performed Overall Cognitive Status: Within Functional Limits for tasks assessed    Extremity/Trunk Assessment Upper Extremity Assessment Upper Extremity Assessment: Generalized weakness Lower Extremity Assessment Lower Extremity Assessment: Defer to PT evaluation     Mobility Bed Mobility Overal bed mobility: Needs Assistance Bed Mobility: Rolling;Sidelying to Sit Rolling: Min assist Sidelying to sit: Min assist;HOB elevated General bed mobility comments: pt recalled proper technique; min assist to complete roll and raise trunk to sit Transfers Overall transfer level:  Needs assistance Equipment used: 1 person hand held assist Transfers: Sit to/from [employer] Sit to Stand: Min guard Stand pivot transfers: Min assist General transfer comment: vc for safe use of RW (however pt continues to place hands on RW to come to stand. RW does not tip as he applies almost no pressure on it)     Exercise     Balance Balance Standing balance-Leahy Scale: Fair   End of Session OT - End of Session Equipment Utilized During Treatment: Back brace Activity Tolerance: Patient tolerated treatment well Patient left: in chair;with call bell/phone within reach Nurse Communication: Mobility status  GO     Jeani Hawking M 10/04/2013, 5:45 PM

## 2013-10-04 NOTE — Progress Notes (Addendum)
NUTRITION FOLLOW UP  Intervention:   Prostat liquid protein po 30 ml BID with meals, each supplement provides 100 kcal, 15 grams protein RD to follow for nutrition care plan  Nutrition Dx:   Inadequate oral intake, resolved  New Nutrition Dx: Increased nutrient needs related to trauma as evidenced by estimated nutrition needs, ongoing  Goal:   Pt to meet >/= 90% of their estimated nutrition needs, progressing  Monitor:   PO & supplemental intake, weight, labs, I/O's  Assessment:   PMHx significant for CAD and alcoholism. Admitted with alcohol intoxication. Family reports that [facility] and was sober for 6 weeks until tonight. Pt was found after sustaining a fall. Pt sustained ICH, several fractures (spine, long bones, ribs, facial), pneumothorax, chest contusion, cardiac contusion, liver injury, splenic injury, multiple contusions, and perforated viscus.   2/14-Potassium is presently low at 3.5. Appears well-nourished with no significant muscle or fat mass loss visible during brief physical exam. Pt states that his weight is usually around 200 lb which is consistent with current weight. Denies poor appetite. Pt is at nutrition risk 2/2 ETOH abuse and polytrauma.   2-16-Pt is intubated on ventilatory support due to pt having a difficult time coughing and clearing his secretions. Initiated Pivot 1.5 @ 15 ml/hr and increase by 10 ml every 8 hours to goal rate of 55 ml/hr with 30 ml Prostat BID via tube to provide 2180 kcals, 154 grams of protein, and 1002 ml of free water. Pt at risk for refeeding syndrome given hx of ETOH abuse.  Patient advanced to Clear Liquids 2/18, Carbohydrate Modified 2/19.  PO intake good at 85-100% per flowsheet records.  Receiving MVI, folic acid and thiamine daily via Alcohol Withdrawal Protocol.  Would benefit from additional protein given recent trauma -- will order.  Possible IP Rehab admission.     Height: Ht Readings from Last 1 Encounters:  09/28/13 5\' 11"  (1.803 m)    Weight Status:   Wt Readings from Last 1 Encounters:  10/04/13 224 lb 6.9 oz (101.8 kg)    Re-estimated needs:  Kcal: 2050-2250 Protein: 130-140 gm Fluid: 2.0-2.2 L  Skin: several incision & lacerations  Diet Order: Carb Control   Intake/Output Summary (Last 24 hours) at 10/04/13 1340 Last data filed at 10/04/13 0700  Gross per 24 hour  Intake   1980 ml  Output   1800 ml  Net    180 ml    Labs:   Recent Labs Lab 09/27/13 2357 09/29/13 0308 09/30/13 0330  10/03/13 0430 10/03/13 1600 10/04/13 0445  NA 144 141  --   < > 140 138 141  K 3.5* 4.0  --   < > 3.8 4.0 4.1  CL 103 102  --   < > 97 95* 99  CO2  --  28  --   < > 32 32 32  BUN 12 12  --   < > 26* 25* 28*  CREATININE 1.20 0.85  --   < > 1.16 1.11 1.30  CALCIUM  --  7.9*  --   < > 8.6 8.6 8.5  MG  --  1.4* 1.6  --   --   --   --   GLUCOSE 161* 161*  --   < > 104* 129* 107*  < > = values in this interval not displayed.  CBG (last 3)   Recent Labs  10/04/13 0447 10/04/13 0749 10/04/13 1221  GLUCAP  120* 98 121*    Scheduled Meds: . enoxaparin (LOVENOX) injection  30 mg Subcutaneous Q24H  . feeding supplement (PRO-STAT SUGAR FREE 64)  30 mL Per Tube BID  . folic acid  1 mg Per Tube Daily  . hydrochlorothiazide  25 mg Per Tube Daily  . insulin aspart  0-15 Units Subcutaneous 6 times per day  . ipratropium-albuterol  3 mL Nebulization BID  . multivitamin with minerals  1 tablet Per Tube Daily  . neomycin-bacitracin-polymyxin  1 application Topical BID  . pantoprazole  40 mg Oral Q1200  . piperacillin-tazobactam (ZOSYN)  IV  3.375 g Intravenous Q8H  . potassium chloride  40 mEq Oral BID  . propranolol  20 mg Oral QID  . selenium  200 mcg Per Tube Daily  . sodium chloride  10-40 mL Intracatheter Q12H  . [START ON 10/05/2013] thiamine  100 mg Oral Daily  . vancomycin  1,000 mg Intravenous Q8H  . vitamin C  1,000 mg Per  Tube 3 times per day  . vitamin E  400 Units Oral TID    Continuous Infusions: . sodium chloride 20 mL/hr (10/02/13 0900)  . propofol      Maureen Chatters, RD, LDN Pager #: [phone] After-Hours Pager #: [phone]

## 2013-10-05 LAB — GLUCOSE, CAPILLARY
Glucose-Capillary: 105 mg/dL — ABNORMAL HIGH (ref 70–99)
Glucose-Capillary: 113 mg/dL — ABNORMAL HIGH (ref 70–99)
Glucose-Capillary: 129 mg/dL — ABNORMAL HIGH (ref 70–99)
Glucose-Capillary: 158 mg/dL — ABNORMAL HIGH (ref 70–99)
Glucose-Capillary: 99 mg/dL (ref 70–99)

## 2013-10-05 LAB — CREATININE, SERUM
Creatinine, Ser: 1.47 mg/dL — ABNORMAL HIGH (ref 0.50–1.35)
GFR calc Af Amer: 56 mL/min — ABNORMAL LOW (ref >90)
GFR calc non Af Amer: 48 mL/min — ABNORMAL LOW (ref >90)

## 2013-10-05 MED ORDER — MORPHINE SULFATE 2 MG/ML IJ SOLN: 2.0000 mg | INTRAMUSCULAR | Status: DC | PRN

## 2013-10-05 MED ORDER — TRAMADOL HCL 50 MG PO TABS
50.0000 mg | ORAL_TABLET | Freq: Four times a day (QID) | ORAL | Status: DC | PRN
  Administered 2013-10-05 – 2013-10-06 (×3): 100 mg via ORAL
  Filled 2013-10-05 (×4): qty 2

## 2013-10-05 NOTE — Progress Notes (Signed)
Patient ID: Philip Ford, male   DOB: 1945-09-18, 68 y.o.   MRN: [id number]   LOS: 8 days   Subjective: Doing well. Pain controlled.   Objective: Vital signs in last 24 hours: Temp:  [97.9 F (36.6 C)-98.9 F (37.2 C)] 98.3 F (36.8 C) (02/21 0800) Pulse Rate:  [40-84] 77 (02/21 0800) Resp:  [14-25] 24 (02/21 0800) BP: (133-151)/(58-104) 142/86 mmHg (02/21 0800) SpO2:  [92 %-100 %] 99 % (02/21 0800) Weight:  [211 lb 8 oz (95.936 kg)] 211 lb 8 oz (95.936 kg) (02/21 0414) Last BM Date: 10/04/13   Laboratory  BMET  Recent Labs  10/03/13 1600 10/04/13 0445 10/05/13 0508  NA 138 141  --   K 4.0 4.1  --   CL 95* 99  --   CO2 32 32  --   GLUCOSE 129* 107*  --   BUN 25* 28*  --   CREATININE 1.11 1.30 1.47*  CALCIUM 8.6 8.5  --    CBG (last 3)   Recent Labs  10/04/13 2000 10/05/13 0011 10/05/13 0414  GLUCAP 97 99 113*    Physical Exam General appearance: alert and no distress Resp: wheezes end expiratory Cardio: regular rate and rhythm GI: normal findings: bowel sounds normal and soft, non-tender   Assessment/Plan: Fall  Scalp lac  L 3-11 rib fx -- Pulmonary toilet  Mult thoracic TVP fxs  T9 vertebral body fx -- TLSO  L1 TVP fx  ABL anemia - Stable ID -- Vanc/Zosyn D#3 empiric, CXs neg, will d/c abx Alcohol abuse -- CIWA protocol, vitamins  HTN -- Improved  FEN - Will give fluid for mild AKI VTE - SCD's, Lovenox  Dispo -- Transfer to floor    Freeman CaldronMichael J. Ashlee Player, PA-C Pager: [phone] General Trauma PA Pager: [phone]  10/05/2013

## 2013-10-05 NOTE — Progress Notes (Signed)
Agree to floor appears comfortable.

## 2013-10-05 NOTE — Progress Notes (Signed)
Pt tx [facility] [place] per MD order, pt tol well, pt verbalized understanding of tx, pt's family notified, report called to receiving RN, all questions answered, [facility]

## 2013-10-06 LAB — BASIC METABOLIC PANEL
BUN: 22 mg/dL (ref 6–23)
CO2: 26 mEq/L (ref 19–32)
Calcium: 9.2 mg/dL (ref 8.4–10.5)
Chloride: 98 mEq/L (ref 96–112)
Creatinine, Ser: 1.34 mg/dL (ref 0.50–1.35)
GFR calc Af Amer: 62 mL/min — ABNORMAL LOW (ref >90)
GFR calc non Af Amer: 54 mL/min — ABNORMAL LOW (ref >90)
Glucose, Bld: 113 mg/dL — ABNORMAL HIGH (ref 70–99)
Potassium: 4.3 mEq/L (ref 3.7–5.3)
Sodium: 137 mEq/L (ref 137–147)

## 2013-10-06 LAB — GLUCOSE, CAPILLARY
Glucose-Capillary: 116 mg/dL — ABNORMAL HIGH (ref 70–99)
Glucose-Capillary: 119 mg/dL — ABNORMAL HIGH (ref 70–99)
Glucose-Capillary: 101 mg/dL — ABNORMAL HIGH (ref 70–99)
Glucose-Capillary: 112 mg/dL — ABNORMAL HIGH (ref 70–99)

## 2013-10-06 MED ORDER — TRAMADOL HCL 50 MG PO TABS: 50.0000 mg | ORAL_TABLET | Freq: Four times a day (QID) | ORAL | Status: DC | PRN

## 2013-10-06 NOTE — Progress Notes (Signed)
Occupational Therapy Treatment and Discharge Patient Details Name: Philip Ford MRN: [id number] DOB: Dec 21, 1945 Today's Date: 10/06/2013 Time: [id number] OT Time Calculation (min): 25 min  OT Assessment / Plan / Recommendation  History of present illness The patient is 68 yo male involved in fall down stairs. Came to [place] where work up showed multiple left rib fractures, T9 fracture, and multiple transverse process fractures. Neurosurgical input requested.  Pt intubated 2/16 and finally extubated 2/18.   OT comments  This 68 yo male progressing towards goals and will have 24 hour A/S from his wife and other family. All education completed, will D/C from acute OT due to pt D/C'ing today.  Follow Up Recommendations  No OT follow up;Supervision/Assistance - 24 hour       Equipment Recommendations  None recommended by OT       Frequency Min 2X/week   Progress towards OT Goals Progress towards OT goals: Progressing toward goals  Plan Discharge plan remains appropriate    Precautions / Restrictions Precautions Precautions: Fall;Back Required Braces or Orthoses: Spinal Brace Spinal Brace: Thoracolumbosacral orthotic;Applied in sitting position Restrictions Weight Bearing Restrictions: No   Pertinent Vitals/Pain No c/o--"they just gave me pain meds recently"    ADL  Equipment Used: Back brace;Rolling walker Transfers/Ambulation Related to ADLs: Minguard A for all with RW. Instructed pt and wife how pt should side step into tub to his tub seat and for now to shower with brace on until they can follow up with Dr Sanjuana MaeKritizer about showering without brace on. Showed wife how she would change out pads of brace and how to clean them.  Pt states that Dr[facility] Ger[facility]eeKritzer [facility]. ADL Comments: Wife to A him with LB ADLs until he can do them by himself. Wife donned brace with minimal cues--this is the first time she had been instructed.      OT Goals(current goals  can now be found in the care plan section)    Visit Information  Last OT Received On: 10/06/13 Assistance Needed: +1 History of Present Illness: The patient is 68 yo male involved in fall down stairs. Came to [place] where work up showed multiple left rib fractures, T9 fracture, and multiple transverse process fractures. Neurosurgical input requested.  Pt intubated 2/16 and finally extubated 2/18.          Cognition  Cognition Arousal/Alertness: Awake/alert Behavior During Therapy: WFL for tasks assessed/performed Overall Cognitive Status: Within Functional Limits for tasks assessed    Mobility  Bed Mobility Overal bed mobility: Needs Assistance Bed Mobility: Rolling;Sidelying to Sit Rolling: Min guard General bed mobility comments: VCs to bend up legs before starting to roll Transfers Overall transfer level: Needs assistance Transfers: Sit to/from Stand Sit to Stand: Min guard General transfer comment: VCs for safe hand placement          End of Session OT - End of Session Equipment Utilized During Treatment: Rolling walker;Back brace Activity Tolerance: Patient tolerated treatment well Patient left: with family/visitor present (sitting EOB) Nurse Communication:  (Pt ready to go from OT standpoint)       Evette GeorgesLeonard, Aamilah Augenstein Eva [phone] 10/06/2013, 1:10 PM

## 2013-10-06 NOTE — Discharge Instructions (Signed)
Wear brace whenever sitting up or standing.  No driving until cleared by Dr. Gerlene FeeKritzer.

## 2013-10-06 NOTE — Discharge Summary (Signed)
Physician Discharge Summary  Patient ID: Philip Ford MRN: [id number] DOB/AGE: 12/02/1945 68 y.o.  Admit date: 09/27/2013 Discharge date: 10/06/2013  Discharge Diagnoses Patient Active Problem List   Diagnosis Date Noted  . Multiple fractures of ribs of left side 09/30/2013  . Acute respiratory failure 09/30/2013  . Scalp laceration 09/30/2013  . Fracture of thoracic transverse processes 09/30/2013  . Multiple abrasions 09/30/2013  . Acute blood loss anemia 09/30/2013  . T9 vertebral fracture 09/30/2013  . Lumbar transverse process fracture 09/30/2013  . HTN (hypertension) 09/30/2013  . Alcohol abuse 09/30/2013  . Fall 09/28/2013    Consultants Dr. Aliene Beamsandy Kritzer for neurosurgery  Dr. Faith RogueZachary Swartz for PM&R   Procedures Repair of scalp laceration by Dr. Derwood KaplanAnkit Nanavati   HPI: Philip Ford fell at home, unwitnessed. He denied loss of consciousness or amnesia though he was intoxicated. He initially refused evaluation but family convinced him otherwise and he was brought to the ED via EMS. His workup included CT scans of the head, cervical spine, thoracic spine, abdomen, and pelvis and showed the above-mentioned injuries. His scalp laceration was repaired by the ED physician. He was admitted by the trauma service and neurosurgery was consulted.   Hospital Course: Neurosurgery recommended non-operative treatment in a TLSO brace for the patient. He was mobilized with physical and occupational therapies but despite that and aggressive pulmonary toilet the patient required intubation for hypoxia on hospital day #3. He was weaned over the next 3 days and was able to be extubated again. Inpatient rehabilitation was consulted and initially agreed with admission but the patient progressed so quickly that he did not end up needing it. Once his pain was controlled on oral medications he was able to be discharged home in improved condition.      Medication List         aspirin EC 81 MG tablet   Take 81 mg by mouth daily.     hydrochlorothiazide 25 MG tablet  Commonly known as:  HYDRODIURIL  Take 1 tablet by mouth daily.     lisinopril 10 MG tablet  Commonly known as:  PRINIVIL,ZESTRIL  Take 1 tablet by mouth daily.     omeprazole 20 MG capsule  Commonly known as:  PRILOSEC  Take 20 mg by mouth daily.     traMADol 50 MG tablet  Commonly known as:  ULTRAM  Take 1-2 tablets (50-100 mg total) by mouth every 6 (six) hours as needed (Pain).             Follow-up Information   Schedule an appointment as soon as possible for a visit with Reinaldo MeekerKRITZER,RANDY O, MD.   Specialty:  Neurosurgery   Contact information:   [address] [place] [address] [phone]       Call [facility]. (As needed)    Contact information:   [address] [place] [address] [phone]       Signed: Freeman CaldronMichael J. Hopie Pellegrin, PA-C Pager: [phone] General Trauma PA Pager: [phone] 10/06/2013, 10:09 AM

## 2013-10-06 NOTE — Progress Notes (Signed)
Patient ID: Philip Ford, male   DOB: 11/12/1945, 68 y.o.   MRN: [id number]   LOS: 9 days   Subjective: No c/o, ready to go home.   Objective: Vital signs in last 24 hours: Temp:  [97.9 F (36.6 C)-98.6 F (37 C)] 98.5 F (36.9 C) (02/22 0852) Pulse Rate:  [73-95] 95 (02/22 0852) Resp:  [20-21] 20 (02/22 0852) BP: (146-179)/(62-83) 179/83 mmHg (02/22 0852) SpO2:  [90 %-100 %] 95 % (02/22 0852) Weight:  [203 lb 11.3 oz (92.4 kg)] 203 lb 11.3 oz (92.4 kg) (02/22 0500) Last BM Date: 10/05/13   Laboratory  BMET  Recent Labs  10/04/13 0445 10/05/13 0508 10/06/13 0510  NA 141  --  137  K 4.1  --  4.3  CL 99  --  98  CO2 32  --  26  GLUCOSE 107*  --  113*  BUN 28*  --  22  CREATININE 1.30 1.47* 1.34  CALCIUM 8.5  --  9.2    Physical Exam General appearance: alert and no distress Resp: clear to auscultation bilaterally Cardio: regular rate and rhythm GI: normal findings: bowel sounds normal and soft, non-tender   Assessment/Plan: Fall  Scalp lac  L 3-11 rib fx -- Pulmonary toilet  Mult thoracic TVP fxs  T9 vertebral body fx -- TLSO  L1 TVP fx  ABL anemia - Stable  Alcohol abuse -- CIWA protocol, vitamins  HTN -- Improved  Dispo -- D/C home    Freeman CaldronMichael J. Alonzo Loving, PA-C Pager: [phone] General Trauma PA Pager: [phone]  10/06/2013

## 2013-10-06 NOTE — Progress Notes (Signed)
Pt discharged home in stable condition with family to have home health services with [facility] arranged per Case management.

## 2013-10-06 NOTE — Progress Notes (Signed)
PICC line d/c'ed per order.  No ADN No signs of infection present.  Verbalizes understanding of signs of infection and bleeding and interventins for both and dressing care via teach back method.  Vaseline guaze and dry 4x4 applied to site.

## 2013-10-06 NOTE — Progress Notes (Signed)
   CARE MANAGEMENT NOTE 10/06/2013  Patient:  Iran PlanasJOYCE,Denson W   Account Number:  [id number]  Date Initiated:  10/01/2013  Documentation initiated by:  Carlyle LipaBRYSON,MICHELLE  Subjective/Objective Assessment:   fall; resp distress requiring intubation on hospital day 2     Action/Plan:   await medical stability to plan dc options   Anticipated DC Date:  10/11/2013   Anticipated DC Plan:  HOME W HOME HEALTH SERVICES      DC Planning Services  CM consult      [facility]   Choice offered to / List presented to:  C-1 Patient        HH arranged  HH-2 PT      [facility]  [facility]   Status of service:  Completed, signed off Medicare Important Message given?   (If response is "NO", the following Medicare IM given date fields will be blank) Date Medicare IM given:   Date Additional Medicare IM given:    Discharge Disposition:  HOME W HOME HEALTH SERVICES  Per UR Regulation:  Reviewed for med. necessity/level of care/duration of stay  If discussed at Long Length of Stay Meetings, dates discussed:   10/03/2013    Comments:  10/06/13 12:38 CM spoke with pt in room to offer choice for HHPT.  Pt chose [facility] to render HHPT.  Address and contact number was verified with pt.  Corrie DandyMary, of [facility], contacted with referral. Pt states his PCP is N[place], MD.   No other CM needs were communicated.  Freddy JakschSarah Summers Buendia, BSN, [place] [phone].  10/04/2013 Carlyle LipaMichelle Bryson, RN BSN [facility] CCM [phone]--Pt screened by CIR MD and [facility]. Continue to follow for d/c day.

## 2013-10-06 NOTE — Progress Notes (Signed)
Agree with above 

## 2013-10-07 LAB — CULTURE, BLOOD (ROUTINE X 2)
Culture: NO GROWTH
Culture: NO GROWTH

## 2013-12-16 ENCOUNTER — Other Ambulatory Visit (HOSPITAL_COMMUNITY): Payer: Self-pay | Admitting: Neurosurgery

## 2013-12-16 DIAGNOSIS — S22009A Unspecified fracture of unspecified thoracic vertebra, initial encounter for closed fracture: Secondary | ICD-10-CM

## 2013-12-17 ENCOUNTER — Ambulatory Visit (HOSPITAL_COMMUNITY)
Admission: RE | Admit: 2013-12-17 | Discharge: 2013-12-17 | Disposition: A | Discharge status: Home / Self Care | Payer: Medicare Other | Source: Ambulatory Visit | Attending: Neurosurgery | Admitting: Neurosurgery

## 2013-12-17 DIAGNOSIS — S22009A Unspecified fracture of unspecified thoracic vertebra, initial encounter for closed fracture: Secondary | ICD-10-CM

## 2013-12-17 DIAGNOSIS — IMO0002 Reserved for concepts with insufficient information to code with codable children: Secondary | ICD-10-CM | POA: Insufficient documentation

## 2014-05-01 DIAGNOSIS — E291 Testicular hypofunction: Secondary | ICD-10-CM | POA: Insufficient documentation

## 2014-05-29 IMAGING — CT CT T SPINE W/O CM
3 series · 10 of 33 positions shown, 12 images · non-contrast
Comparison: CT thoracic spine 09/28/2013.

CLINICAL DATA: Status post fall 09/28/2013 with a T9 fracture.

EXAM:
CT THORACIC SPINE WITHOUT CONTRAST
TECHNIQUE: Multidetector CT imaging of the thoracic spine was performed without
intravenous contrast administration. Multiplanar CT image
reconstructions were also generated.

[Series 2: thoracic spine 2.0 b30s · axial · 0.33mm/px · z∈[-148,-10]mm · 2 of 150 slices shown, 3 images]
[im 46/150  soft-tissue]
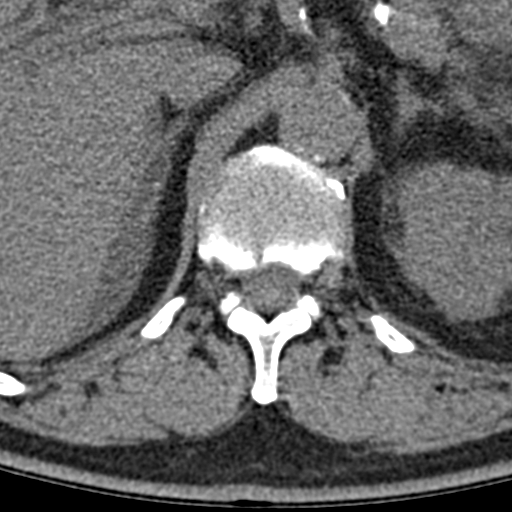
[im 46/150  bone]
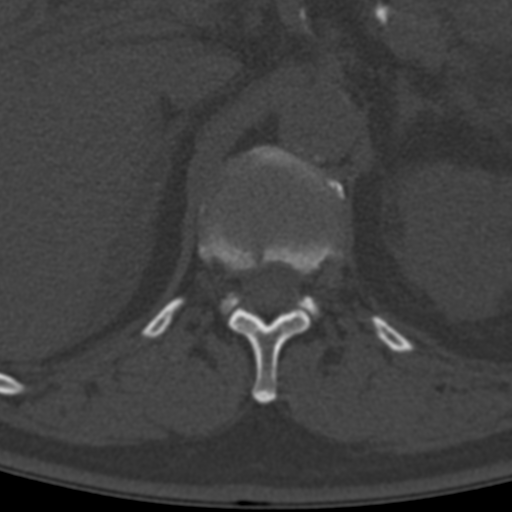
[im 115/150  bone]
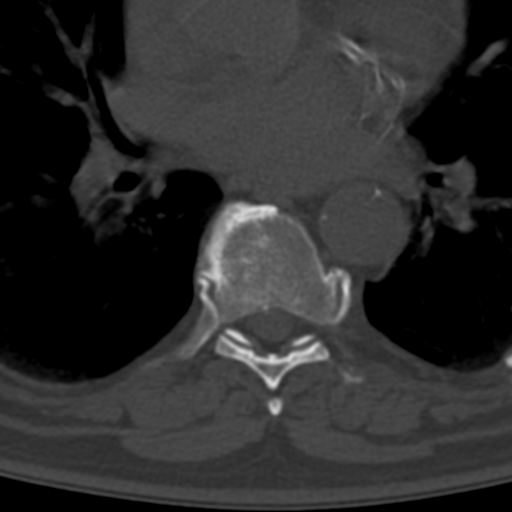

[Series 4: thoracic spine 2.0 spo · coronal · 0.40mm/px · 3 of 74 slices shown (1 of 2)]
[im 15/74  bone]
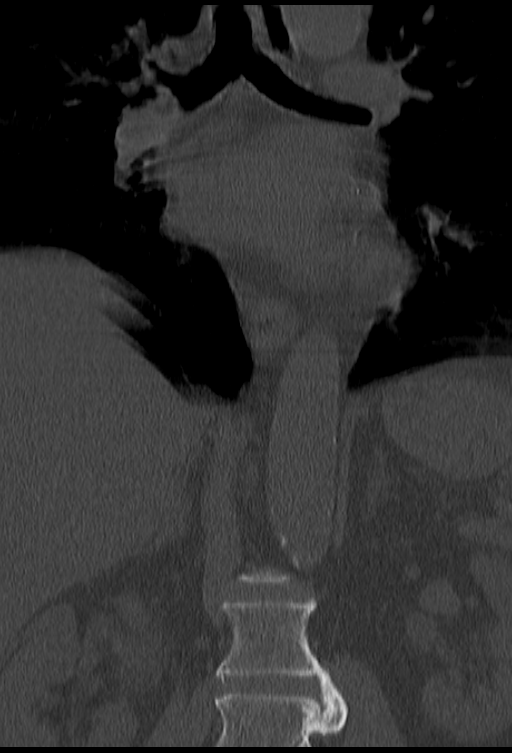
[im 30/74  bone]
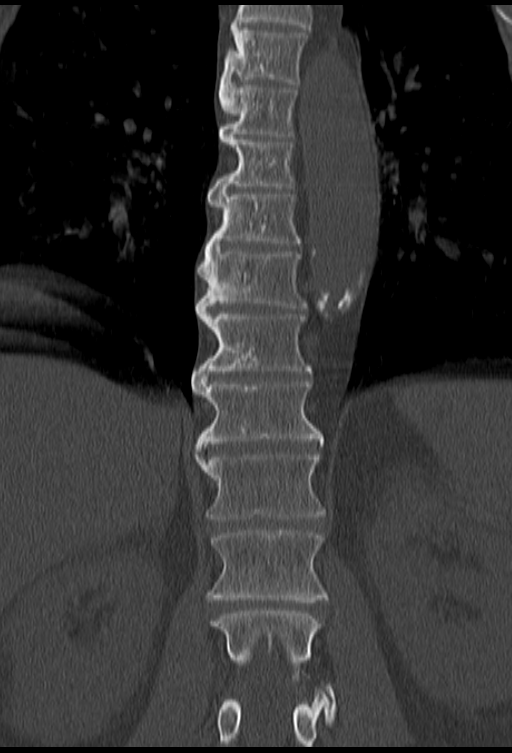
[im 44/74  bone]
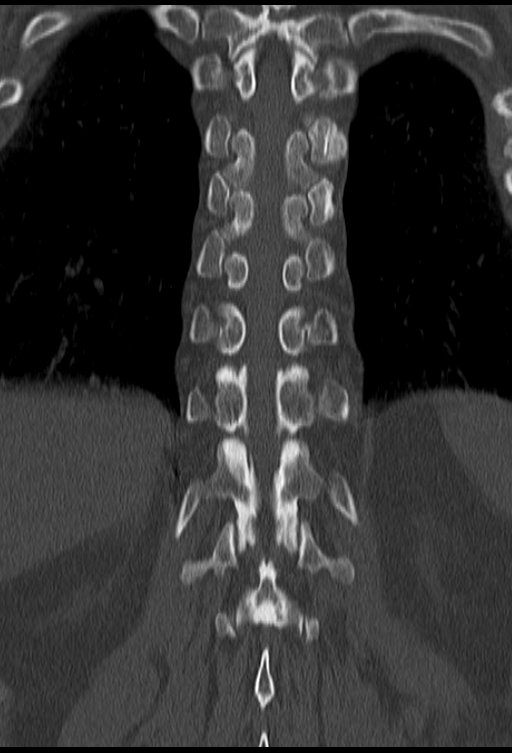

[Series 5: thoracic spine 2.0 spo · sagittal · 0.29mm/px · 5 of 81 slices shown, 6 images (2 of 2)]
[im 27/81  bone]
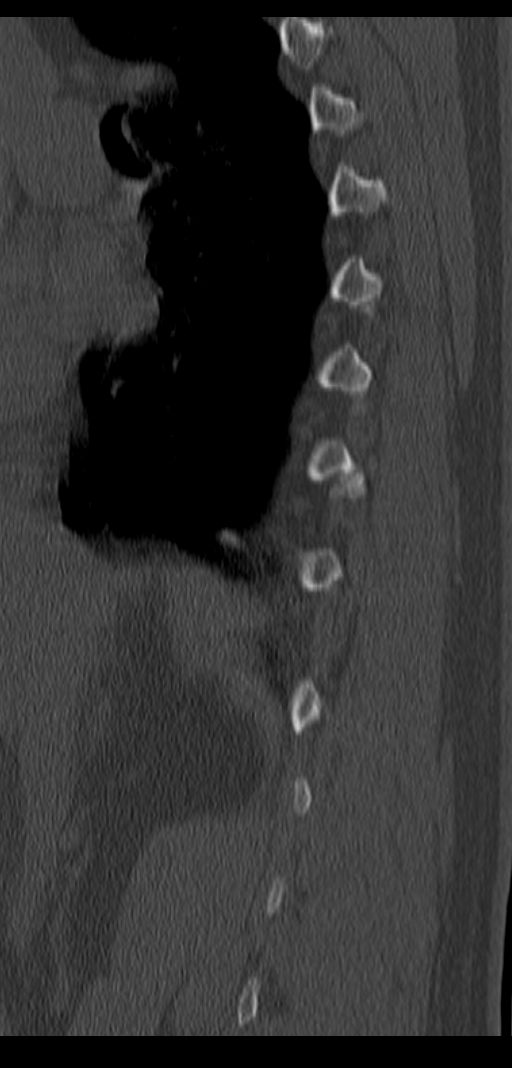
[im 34/81  bone]
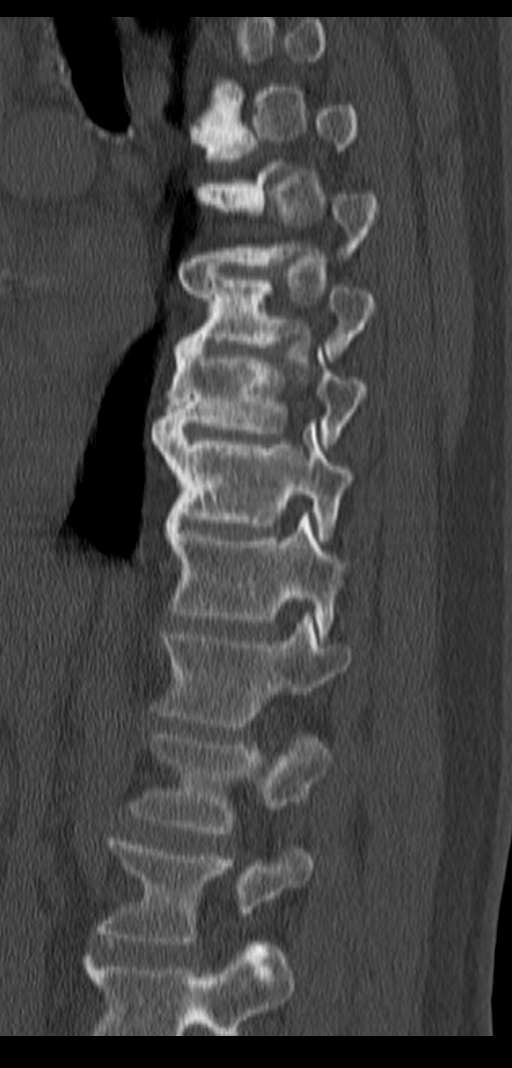
[im 41/81  soft-tissue]
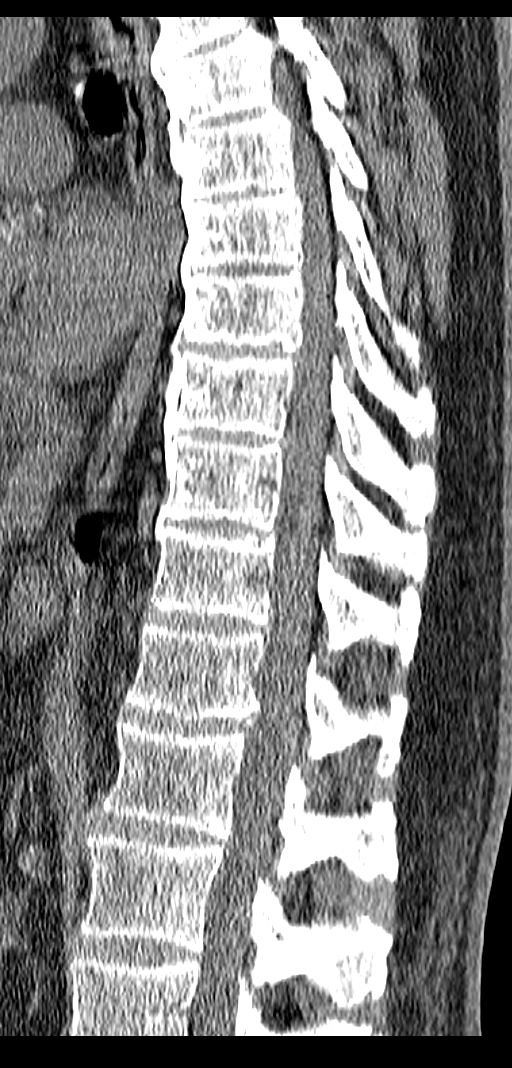
[im 41/81  bone]
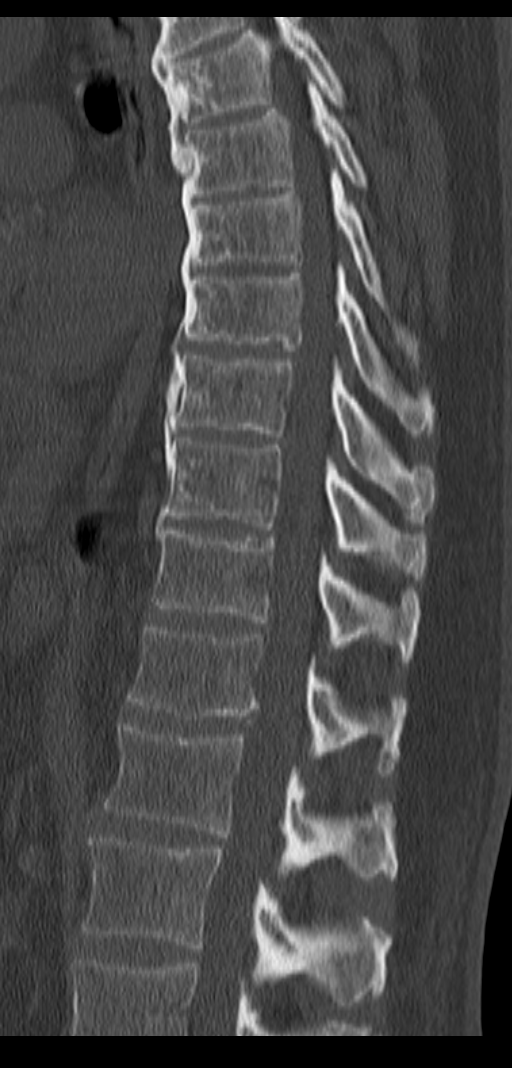
[im 47/81  bone]
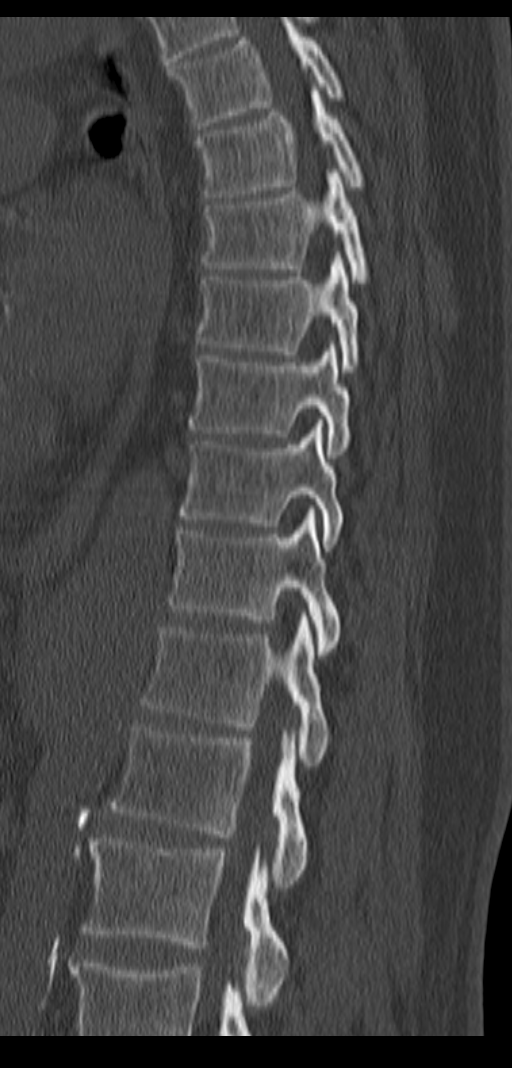
[im 54/81  bone]
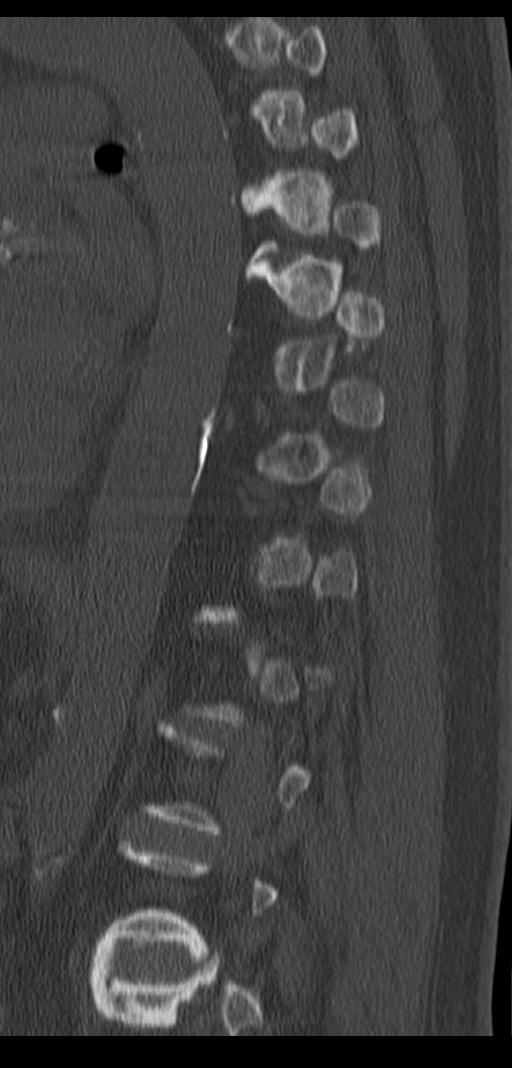

[10 of 33 positions shown; findings below may reference images not displayed]

FINDINGS: Previously seen fracture of an endplate spur on the right at T9
extending into the anterior aspect of the vertebral body shows
progressive healing with sclerosis and nonvisualization of fracture
lines. Vertebral body height is maintained. Transverse process
fractures on the left from T7-L2 also show progressive healing.
Several of the transverse process fractures are no longer visible.
Left ninth through eleventh rib fractures are healing. There is no
new fracture. Imaged lung parenchyma is clear.
IMPRESSION: Nondisplaced T9 fracture seen on the prior exam shows progressive
healing. Left transverse process and left rib fractures detailed
above also demonstrate healing since the prior study. No new
abnormality is identified.

## 2014-11-10 DIAGNOSIS — G8929 Other chronic pain: Secondary | ICD-10-CM | POA: Insufficient documentation

## 2015-01-14 DIAGNOSIS — E559 Vitamin D deficiency, unspecified: Secondary | ICD-10-CM | POA: Insufficient documentation

## 2015-04-24 ENCOUNTER — Ambulatory Visit: Payer: Medicare Other | Admitting: Cardiology

## 2015-05-08 ENCOUNTER — Ambulatory Visit (INDEPENDENT_AMBULATORY_CARE_PROVIDER_SITE_OTHER): Payer: Medicare Other | Admitting: Cardiology

## 2015-05-08 ENCOUNTER — Encounter: Payer: Self-pay | Admitting: *Deleted

## 2015-05-08 ENCOUNTER — Telehealth: Payer: Self-pay | Admitting: Cardiology

## 2015-05-08 ENCOUNTER — Encounter: Payer: Self-pay | Admitting: Cardiology

## 2015-05-08 VITALS — BP 138/78 | HR 69 | Ht 71.0 in | Wt 198.0 lb

## 2015-05-08 DIAGNOSIS — I10 Essential (primary) hypertension: Secondary | ICD-10-CM | POA: Diagnosis not present

## 2015-05-08 DIAGNOSIS — E782 Mixed hyperlipidemia: Secondary | ICD-10-CM

## 2015-05-08 DIAGNOSIS — I25709 Atherosclerosis of coronary artery bypass graft(s), unspecified, with unspecified angina pectoris: Secondary | ICD-10-CM | POA: Diagnosis not present

## 2015-05-08 DIAGNOSIS — IMO0002 Reserved for concepts with insufficient information to code with codable children: Secondary | ICD-10-CM

## 2015-05-08 DIAGNOSIS — E1165 Type 2 diabetes mellitus with hyperglycemia: Secondary | ICD-10-CM | POA: Diagnosis not present

## 2015-05-08 NOTE — Telephone Encounter (Signed)
Lexiscan Cardiolite on medication dx: CAD w/angina Schedule at [facility] 9/28 arrive at

## 2015-05-08 NOTE — Progress Notes (Signed)
Cardiology Office Note  Date: 05/08/2015   ID: Sten, Dematteo Jul 08, 1946, MRN [id number]  PCP: Rebecka Apley, RN, NP  Consulting Cardiologist: Nona Dell, MD   Chief Complaint  Patient presents with  . Coronary Artery Disease    History of Present Illness: Philip Ford is a 69 y.o. male referred for cardiology consultation by Ms. Hemberg NP. He presents describing episodes of exertional chest tightness that respond to nitroglycerin, also similar feelings after he eats sometimes that are more consistent with reflux. He has been on Nexium long-term. Does not notice any sharp decline in his stamina, tries to stay active tending a large garden and other outdoor chores. He has noted that the exertional chest tightness has been more frequent in the last several months.  I could not locate any old cardiac records on this patient in the electronic medical record. He recalls undergoing a cardiac catheterization at [facility] back in 1999 and having a stent placed on the "front" of his heart. We are trying to get the old report for review.  ECG today shows sinus rhythm with PVC, nonspecific ST changes.  He also has a history of statin intolerance, is very leery of taking any type of cholesterol lowering medication. He was recently tried on fenofibrate, however did not tolerate this and stopped it already. I did talk with him about the possibility of trying Zetia next.  Recent lab work is outlined below.  Past Medical History  Diagnosis Date  . Coronary artery disease     Cardiac catherization 1999 - stent to "front" of heart  . Essential hypertension   . GERD (gastroesophageal reflux disease)   . Hyperlipidemia   . Type 2 diabetes mellitus   . History of Helicobacter pylori infection   . Vitamin D deficiency   . Statin intolerance     History reviewed. No pertinent past surgical history.  Current Outpatient Prescriptions  Medication Sig Dispense Refill  .  Ascorbic Acid (VITAMIN C) 1000 MG tablet Take 1,000 mg by mouth daily.    Marland Kitchen aspirin EC 81 MG tablet Take 81 mg by mouth daily.    . Cyanocobalamin (VITAMIN B 12 PO) Take by mouth daily.    . Esomeprazole Magnesium (NEXIUM PO) Take by mouth daily.    . hydrochlorothiazide (HYDRODIURIL) 25 MG tablet Take 1 tablet by mouth daily.    Marland Kitchen lisinopril (PRINIVIL,ZESTRIL) 10 MG tablet Take 1 tablet by mouth daily.    . metFORMIN (GLUCOPHAGE) 500 MG tablet Take 500 mg by mouth daily with breakfast.    . metoprolol succinate (TOPROL-XL) 50 MG 24 hr tablet Take 25 mg by mouth daily. Take with or immediately following a meal.    . Multiple Vitamins-Minerals (CENTRUM SILVER PO) Take by mouth daily.    . nitroGLYCERIN (NITROSTAT) 0.4 MG SL tablet Place 0.4 mg under the tongue every 5 (five) minutes as needed for chest pain.     No current facility-administered medications for this visit.    Allergies:  Fenofibrate   Social History: The patient  reports that he has been smoking Cigarettes.  He has a 25 pack-year smoking history. He has never used smokeless tobacco. He reports that he drinks alcohol. He reports that he does not use illicit drugs.   Family History: The patient's family history includes Heart attack in his father, paternal aunt, and paternal uncle.   ROS:  Please see the history of present illness. Otherwise, complete review of systems is positive  for arthritic pains at times.  All other systems are reviewed and negative.   Physical Exam: VS:  BP 138/78 mmHg  Pulse 69  Ht  (1.803 m)  Wt 198 lb (89.812 kg)  BMI 27.63 kg/m2  SpO2 98%, BMI Body mass index is 27.63 kg/(m^2).  Wt Readings from Last 3 Encounters:  05/08/15 198 lb (89.812 kg)  10/06/13 203 lb 11.3 oz (92.4 kg)     General: Patient appears comfortable at rest. HEENT: Conjunctiva and lids normal, oropharynx clear. Neck: Supple, no elevated JVP or carotid bruits, no thyromegaly. Lungs: Clear to auscultation, nonlabored  breathing at rest. Cardiac: Regular rate and rhythm, no S3 or significant systolic murmur, no pericardial rub. Abdomen: Soft, nontender, bowel sounds present. Extremities: No pitting edema, distal pulses 2+. Skin: Warm and dry. Musculoskeletal: No kyphosis. Neuropsychiatric: Alert and oriented x3, affect grossly appropriate.   ECG: ECG is ordered today. Tracing from 09/29/2013 showed sinus tachycardia with poor R-wave progression, rule out anteroseptal infarct.   Recent Labwork:  04/21/2015: Hemoglobin 14.6, platelets 236, BUN 18, creatinine 1.2, potassium 3.9, AST 15, ALT 13, hemoglobin A1c 6.9, total cholesterol 243, triglycerides 291, HDL 25, LDL 160  Assessment and Plan:  1. Exertional angina as well as symptoms that are more suggestive of a gastrointestinal etiology. As noted, he is already on Nexium. Nitroglycerin has been helpful. With history of known ischemic heart disease and presumed prior intervention to the LAD in 1999 (records pending), plan will be to proceed with a Lexiscan Cardiolite to reassess ischemic burden. He states that he would have trouble walking on a treadmill.  2. Hyperlipidemia with statin intolerance, recent LDL 160. He could consider trying Zetia, or have referral to the lipid clinic. He is adamantly opposed to trying any additional statin medications.  3. Type 2 diabetes mellitus, recent hemoglobin A1c 6.9.  4. Essential hypertension, no changes made to current regimen.  Current medicines were reviewed with the patient today.   Orders Placed This Encounter  Procedures  . NM Myocar Multi W/Spect W/Wall Motion / EF  . Myocardial Perfusion Imaging  . EKG 12-Lead    Disposition: Call with results.   Signed, Jonelle Sidle, MD, [facility] 05/08/2015 1:56 PM    [facility] [address], [place], [place] [address] Phone: [phone]; Fax: [phone]

## 2015-05-08 NOTE — Patient Instructions (Signed)
Your physician recommends that you continue on your current medications as directed. Please refer to the Current Medication list given to you today. Your physician has requested that you have a lexiscan myoview. For further information please visit [email or web address]. Please follow instruction sheet, as given. We will call you with your results. 

## 2015-05-11 NOTE — Telephone Encounter (Signed)
Auth # [id number] exp 06/25/15

## 2015-05-13 ENCOUNTER — Encounter (HOSPITAL_COMMUNITY)
Admission: RE | Admit: 2015-05-13 | Discharge: 2015-05-13 | Disposition: A | Discharge status: Home / Self Care | Payer: Medicare Other | Source: Ambulatory Visit | Attending: Cardiology | Admitting: Cardiology

## 2015-05-13 ENCOUNTER — Encounter (HOSPITAL_COMMUNITY): Payer: Self-pay

## 2015-05-13 ENCOUNTER — Inpatient Hospital Stay (HOSPITAL_COMMUNITY): Admission: RE | Admit: 2015-05-13 | Payer: Medicare Other | Source: Ambulatory Visit

## 2015-05-13 DIAGNOSIS — I25709 Atherosclerosis of coronary artery bypass graft(s), unspecified, with unspecified angina pectoris: Secondary | ICD-10-CM | POA: Diagnosis not present

## 2015-05-13 LAB — NM MYOCAR MULTI W/SPECT W/WALL MOTION / EF
LV dias vol: 104 mL
LV sys vol: 54 mL
Peak HR: 88 {beats}/min
RATE: 0.79
Rest HR: 66 {beats}/min
SDS: 8
SRS: 18
SSS: 26
TID: 1.05

## 2015-05-13 MED ORDER — REGADENOSON 0.4 MG/5ML IV SOLN
INTRAVENOUS | Status: AC
  Administered 2015-05-13: 0.4 mg via INTRAVENOUS
  Filled 2015-05-13: qty 5

## 2015-05-13 MED ORDER — TECHNETIUM TC 99M SESTAMIBI GENERIC - CARDIOLITE
10.0000 | Freq: Once | INTRAVENOUS | Status: AC | PRN
  Administered 2015-05-13: 10 via INTRAVENOUS

## 2015-05-13 MED ORDER — TECHNETIUM TC 99M SESTAMIBI - CARDIOLITE
30.0000 | Freq: Once | INTRAVENOUS | Status: AC | PRN
  Administered 2015-05-13: 11:00:00 30 via INTRAVENOUS

## 2015-05-13 MED ORDER — SODIUM CHLORIDE 0.9 % IJ SOLN
INTRAMUSCULAR | Status: AC
Start: 2015-05-13 — End: 2015-05-13
  Administered 2015-05-13: 10 mL via INTRAVENOUS
  Filled 2015-05-13: qty 3

## 2015-05-14 ENCOUNTER — Telehealth: Payer: Self-pay | Admitting: *Deleted

## 2015-05-14 ENCOUNTER — Encounter: Payer: Self-pay | Admitting: *Deleted

## 2015-05-14 NOTE — Telephone Encounter (Signed)
Patient informed and verbalized understanding of plan. Patient said at this time his symptoms are the same and patient understands to contact our office if his symptoms get worse so that his appointment can be pushed up. Request made to get old cardiac cath.

## 2015-05-14 NOTE — Telephone Encounter (Signed)
-----   Message from Jonelle Sidle, MD sent at 05/13/2015  5:25 PM EDT ----- Reviewed. He has a moderate-sized region of scar in the distribution of the RCA and possibly LAD. Mild peri-infarct ischemia noted. With increasing angina symptoms, please make sure that he has an office follow-up with me so that we can discuss cardiac catheterization as a next step. Try and get his old cardiac catheterization report as well.

## 2015-06-04 ENCOUNTER — Encounter: Payer: Self-pay | Admitting: *Deleted

## 2015-06-04 ENCOUNTER — Encounter: Payer: Self-pay | Admitting: Cardiology

## 2015-06-04 ENCOUNTER — Ambulatory Visit (INDEPENDENT_AMBULATORY_CARE_PROVIDER_SITE_OTHER): Payer: Medicare Other | Admitting: Cardiology

## 2015-06-04 ENCOUNTER — Other Ambulatory Visit: Payer: Self-pay | Admitting: Cardiology

## 2015-06-04 ENCOUNTER — Telehealth: Payer: Self-pay | Admitting: Cardiology

## 2015-06-04 VITALS — BP 128/64 | HR 69 | Ht 71.0 in | Wt 196.0 lb

## 2015-06-04 DIAGNOSIS — I25709 Atherosclerosis of coronary artery bypass graft(s), unspecified, with unspecified angina pectoris: Secondary | ICD-10-CM

## 2015-06-04 DIAGNOSIS — R9439 Abnormal result of other cardiovascular function study: Secondary | ICD-10-CM

## 2015-06-04 DIAGNOSIS — I10 Essential (primary) hypertension: Secondary | ICD-10-CM

## 2015-06-04 NOTE — Telephone Encounter (Signed)
Left heart cath on medications Wednesday, June 17, 2015 @8 :30 am arrive @6 :30 am with Dr. Excell Seltzerooper dx: CAD & abnormal cardiolite Checking percert

## 2015-06-04 NOTE — Progress Notes (Signed)
Cardiology Office Note  Date: 06/04/2015   ID: Pearla DubonnetDonald W Dallaire, DOB 1945-12-05, MRN [id number]  PCP: Sharon SellerHemberg, Katherine NP  Primary Cardiologist: Nona DellSamuel Elise Knobloch, MD   Chief Complaint  Patient presents with  . Follow-up testing    History of Present Illness: Philip PlanasDonald W Fillingim is a 69 y.o. male seen recently in consultation in September. He has been experiencing progressive angina symptoms as well as indigestion despite antireflux measures. Cardiac history is not well-defined, he reports having had PCI in 1999, possibly stent to the LAD based on description, but I could not actually ever find the old catheterization report. We referred him for a follow-up Cardiolite study which is outlined below revealing LVEF 49% with inferior septal and anteroseptal scar associated with mild peri-infarct ischemia. We discussed these results today.  He continues to have intermittent chest tightness and indigestion. He has been using nitroglycerin with benefit at times.  Today we discussed the risks and benefits of a diagnostic cardiac catheterization to assess coronary anatomy and evaluate for revascularization options. He is in agreement to proceed, but would like to defer until the week after next.   Past Medical History  Diagnosis Date  . Coronary artery disease     Cardiac catherization 1999 - stent to "front" of heart  . Essential hypertension   . GERD (gastroesophageal reflux disease)   . Hyperlipidemia   . Type 2 diabetes mellitus (HCC)   . History of Helicobacter pylori infection   . Vitamin D deficiency   . Statin intolerance     History reviewed. No pertinent past surgical history.  Current Outpatient Prescriptions  Medication Sig Dispense Refill  . Ascorbic Acid (VITAMIN C) 1000 MG tablet Take 1,000 mg by mouth daily.    Marland Kitchen. aspirin EC 81 MG tablet Take 81 mg by mouth daily.    . Cyanocobalamin (VITAMIN B 12 PO) Take by mouth daily.    . Esomeprazole Magnesium (NEXIUM PO) Take by  mouth daily.    . hydrochlorothiazide (HYDRODIURIL) 25 MG tablet Take 1 tablet by mouth daily.    Marland Kitchen. lisinopril (PRINIVIL,ZESTRIL) 10 MG tablet Take 1 tablet by mouth daily.    . metFORMIN (GLUCOPHAGE) 500 MG tablet Take 500 mg by mouth daily with breakfast.    . metoprolol succinate (TOPROL-XL) 50 MG 24 hr tablet Take 25 mg by mouth daily. Take with or immediately following a meal.    . Multiple Vitamins-Minerals (CENTRUM SILVER PO) Take by mouth daily.    . nitroGLYCERIN (NITROSTAT) 0.4 MG SL tablet Place 0.4 mg under the tongue every 5 (five) minutes as needed for chest pain.     No current facility-administered medications for this visit.    Allergies:  Fenofibrate   Social History: The patient  reports that he has been smoking Cigarettes.  He has a 25 pack-year smoking history. He has never used smokeless tobacco. He reports that he drinks alcohol. He reports that he does not use illicit drugs.   ROS:  Please see the history of present illness. Otherwise, complete review of systems is positive for arthritis.  All other systems are reviewed and negative.   Physical Exam: VS:  BP 128/64 mmHg  Pulse 69  Ht 5\' 11"  (1.803 m)  Wt 196 lb (88.905 kg)  BMI 27.35 kg/m2  SpO2 98%, BMI Body mass index is 27.35 kg/(m^2).  Wt Readings from Last 3 Encounters:  06/04/15 196 lb (88.905 kg)  05/08/15 198 lb (89.812 kg)  10/06/13 203 lb 11.3 oz (  92.4 kg)     General: Patient appears comfortable at rest. HEENT: Conjunctiva and lids normal, oropharynx clear. Neck: Supple, no elevated JVP or carotid bruits, no thyromegaly. Lungs: Clear to auscultation, nonlabored breathing at rest. Cardiac: Regular rate and rhythm, no S3 or significant systolic murmur, no pericardial rub. Abdomen: Soft, nontender, bowel sounds present. Extremities: No pitting edema, distal pulses 2+. Skin: Warm and dry. Musculoskeletal: No kyphosis. Neuropsychiatric: Alert and oriented x3, affect grossly appropriate.   ECG:  ECG is not ordered today.   Recent Labwork:  04/21/2015: Hemoglobin 14.6, platelets 236, BUN 18, creatinine 1.2, potassium 3.9, AST 15, ALT 13, hemoglobin A1c 6.9, total cholesterol 243, triglycerides 291, HDL 25, LDL 160.  Other Studies Reviewed Today:  Lexiscan Cardiolite 05/13/2015:  There was no significant ST segment deviation noted during stress.  Moderate-sized defect of moderate to severe intensity noted in the apical anteroseptal, septal, and inferior septal distribution with extension of the inferior defect into the mid and basal LV segment. There is partial reversibility noted near the apex, overall suggests inferior septal and anteroseptal scar with mild apical peri-infarct ischemia.  This is an intermediate risk study.  Nuclear stress EF: 49%.  Assessment and Plan:  1. Progressing angina symptoms as well as indigestion in the face of antireflux measures and medical therapy for CAD. As discussed above, details of cardiac history are not clear, he possibly had a stent to the LAD back in 1999. Recent follow-up Cardiolite was abnormal and overall intermediate risk. We have discussed the risks and benefits of a diagnostic cardiac catheterization for evaluation of coronary anatomy and revascularization options. He is in agreement to proceed, but would like to defer until the week after next.  2. Hyperlipidemia with statin intolerance. He is adamantly opposed to trying any additional statin medications, we may be able to convince him to try Zetia or have a referral to the lipid clinic.  3. Essential hypertension, blood pressure control is reasonable today.  Current medicines were reviewed with the patient today.  Disposition: FU with me in 1 month.   Signed, Jonelle Sidle, MD, [facility] 06/04/2015 11:54 AM    [facility] [address] [place], [place], [place] [address] Phone: [phone]; Fax: [phone]

## 2015-06-04 NOTE — Patient Instructions (Signed)
Your physician recommends that you continue on your current medications as directed. Please refer to the Current Medication list given to you today. Your physician recommends that you schedule a follow-up appointment in: 1 month. Your physician has requested that you have a cardiac catheterization. Cardiac catheterization is used to diagnose and/or treat various heart conditions. Doctors may recommend this procedure for a number of different reasons. The most common reason is to evaluate chest pain. Chest pain can be a symptom of coronary artery disease (CAD), and cardiac catheterization can show whether plaque is narrowing or blocking your heart's arteries. This procedure is also used to evaluate the valves, as well as measure the blood flow and oxygen levels in different parts of your heart. For further information please visit [email or web address]. Please follow instruction sheet, as given.

## 2015-06-09 NOTE — Telephone Encounter (Signed)
[employer] [id number] exp 07-24-15

## 2015-06-16 ENCOUNTER — Telehealth: Payer: Self-pay | Admitting: Cardiology

## 2015-06-16 NOTE — Telephone Encounter (Signed)
Patient wanted to know if the cath site would be in his groin area or wrist. Nurse advised patient that he would have to ask this question tomorrow since the site is not determined by the ordering doctor. Patient verbalized understanding of plan.

## 2015-06-16 NOTE — Telephone Encounter (Signed)
Mr. Philip Ford called stating that he has more questions about the catheterization procedure scheduled for 06-17-15  Please call his home #

## 2015-06-17 ENCOUNTER — Encounter (HOSPITAL_COMMUNITY)
Admission: RE | Disposition: A | Discharge status: Home / Self Care | Payer: Medicare Other | Source: Ambulatory Visit | Attending: Cardiovascular Disease

## 2015-06-17 ENCOUNTER — Ambulatory Visit (HOSPITAL_COMMUNITY)
Admission: RE | Admit: 2015-06-17 | Discharge: 2015-06-18 | Disposition: A | Discharge status: Home / Self Care | Payer: Medicare Other | Source: Ambulatory Visit | Attending: Cardiovascular Disease | Admitting: Cardiovascular Disease

## 2015-06-17 ENCOUNTER — Encounter (HOSPITAL_COMMUNITY): Payer: Self-pay | Admitting: Cardiovascular Disease

## 2015-06-17 DIAGNOSIS — I2584 Coronary atherosclerosis due to calcified coronary lesion: Secondary | ICD-10-CM | POA: Diagnosis not present

## 2015-06-17 DIAGNOSIS — I208 Other forms of angina pectoris: Secondary | ICD-10-CM | POA: Diagnosis not present

## 2015-06-17 DIAGNOSIS — R9439 Abnormal result of other cardiovascular function study: Secondary | ICD-10-CM

## 2015-06-17 DIAGNOSIS — Z7982 Long term (current) use of aspirin: Secondary | ICD-10-CM | POA: Diagnosis not present

## 2015-06-17 DIAGNOSIS — E119 Type 2 diabetes mellitus without complications: Secondary | ICD-10-CM | POA: Diagnosis not present

## 2015-06-17 DIAGNOSIS — I10 Essential (primary) hypertension: Secondary | ICD-10-CM | POA: Diagnosis not present

## 2015-06-17 DIAGNOSIS — Z7984 Long term (current) use of oral hypoglycemic drugs: Secondary | ICD-10-CM | POA: Diagnosis not present

## 2015-06-17 DIAGNOSIS — I25119 Atherosclerotic heart disease of native coronary artery with unspecified angina pectoris: Secondary | ICD-10-CM | POA: Insufficient documentation

## 2015-06-17 DIAGNOSIS — I2510 Atherosclerotic heart disease of native coronary artery without angina pectoris: Secondary | ICD-10-CM | POA: Diagnosis present

## 2015-06-17 DIAGNOSIS — E785 Hyperlipidemia, unspecified: Secondary | ICD-10-CM | POA: Insufficient documentation

## 2015-06-17 DIAGNOSIS — Z955 Presence of coronary angioplasty implant and graft: Secondary | ICD-10-CM | POA: Diagnosis not present

## 2015-06-17 DIAGNOSIS — F17210 Nicotine dependence, cigarettes, uncomplicated: Secondary | ICD-10-CM | POA: Insufficient documentation

## 2015-06-17 DIAGNOSIS — I2582 Chronic total occlusion of coronary artery: Secondary | ICD-10-CM | POA: Insufficient documentation

## 2015-06-17 HISTORY — PX: CARDIAC CATHETERIZATION: SHX172

## 2015-06-17 HISTORY — DX: Type 2 diabetes mellitus without complications: E11.9

## 2015-06-17 LAB — BASIC METABOLIC PANEL
Anion gap: 10 (ref 5–15)
BUN: 11 mg/dL (ref 6–20)
CO2: 28 mmol/L (ref 22–32)
Calcium: 9.9 mg/dL (ref 8.9–10.3)
Chloride: 97 mmol/L — ABNORMAL LOW (ref 101–111)
Creatinine, Ser: 1.06 mg/dL (ref 0.61–1.24)
GFR calc Af Amer: >60 mL/min (ref >60)
GFR calc non Af Amer: >60 mL/min (ref >60)
Glucose, Bld: 123 mg/dL — ABNORMAL HIGH (ref 65–99)
Potassium: 3.7 mmol/L (ref 3.5–5.1)
Sodium: 135 mmol/L (ref 135–145)

## 2015-06-17 LAB — CBC
HCT: 43.7 % (ref 39.0–52.0)
Hemoglobin: 14.9 g/dL (ref 13.0–17.0)
MCH: 31 pg (ref 26.0–34.0)
MCHC: 34.1 g/dL (ref 30.0–36.0)
MCV: 91 fL (ref 78.0–100.0)
Platelets: 196 10*3/uL (ref 150–400)
RBC: 4.8 MIL/uL (ref 4.22–5.81)
RDW: 13.6 % (ref 11.5–15.5)
WBC: 12.8 10*3/uL — ABNORMAL HIGH (ref 4.0–10.5)

## 2015-06-17 LAB — GLUCOSE, CAPILLARY
Glucose-Capillary: 124 mg/dL — ABNORMAL HIGH (ref 65–99)
Glucose-Capillary: 115 mg/dL — ABNORMAL HIGH (ref 65–99)
Glucose-Capillary: 113 mg/dL — ABNORMAL HIGH (ref 65–99)
Glucose-Capillary: 202 mg/dL — ABNORMAL HIGH (ref 65–99)
Glucose-Capillary: 157 mg/dL — ABNORMAL HIGH (ref 65–99)

## 2015-06-17 LAB — PROTIME-INR
INR: 1.04 (ref 0.00–1.49)
Prothrombin Time: 13.8 seconds (ref 11.6–15.2)

## 2015-06-17 LAB — POCT ACTIVATED CLOTTING TIME: Activated Clotting Time: 288 seconds

## 2015-06-17 SURGERY — LEFT HEART CATH AND CORONARY ANGIOGRAPHY
Anesthesia: LOCAL

## 2015-06-17 MED ORDER — FAMOTIDINE IN NACL 20-0.9 MG/50ML-% IV SOLN
INTRAVENOUS | Status: AC
Start: 2015-06-17 — End: 2015-06-17
  Filled 2015-06-17: qty 50

## 2015-06-17 MED ORDER — NITROGLYCERIN 1 MG/10 ML FOR IR/CATH LAB
INTRA_ARTERIAL | Status: DC | PRN
  Administered 2015-06-17: 10:00:00

## 2015-06-17 MED ORDER — METOPROLOL SUCCINATE ER 25 MG PO TB24
25.0000 mg | ORAL_TABLET | Freq: Every day | ORAL | Status: DC
  Administered 2015-06-18: 25 mg via ORAL
  Filled 2015-06-17: qty 1

## 2015-06-17 MED ORDER — ACETAMINOPHEN 325 MG PO TABS
650.0000 mg | ORAL_TABLET | ORAL | Status: DC | PRN
  Administered 2015-06-17: 20:00:00 650 mg via ORAL
  Filled 2015-06-17: qty 2

## 2015-06-17 MED ORDER — HEPARIN SODIUM (PORCINE) 1000 UNIT/ML IJ SOLN
INTRAMUSCULAR | Status: AC
  Filled 2015-06-17: qty 1

## 2015-06-17 MED ORDER — OXYCODONE-ACETAMINOPHEN 5-325 MG PO TABS: 1.0000 | ORAL_TABLET | ORAL | Status: DC | PRN

## 2015-06-17 MED ORDER — SODIUM CHLORIDE 0.9 % IJ SOLN: 3.0000 mL | Freq: Two times a day (BID) | INTRAMUSCULAR | Status: DC

## 2015-06-17 MED ORDER — VERAPAMIL HCL 2.5 MG/ML IV SOLN
INTRAVENOUS | Status: DC | PRN
  Administered 2015-06-17: 10:00:00 via INTRA_ARTERIAL

## 2015-06-17 MED ORDER — HYDROCHLOROTHIAZIDE 25 MG PO TABS
25.0000 mg | ORAL_TABLET | Freq: Every day | ORAL | Status: DC
  Administered 2015-06-18: 25 mg via ORAL
  Filled 2015-06-17: qty 1

## 2015-06-17 MED ORDER — CLOPIDOGREL BISULFATE 300 MG PO TABS
ORAL_TABLET | ORAL | Status: AC
  Filled 2015-06-17: qty 1

## 2015-06-17 MED ORDER — ANGIOPLASTY BOOK
Freq: Once | Status: AC
  Administered 2015-06-17: 22:00:00
  Filled 2015-06-17: qty 1

## 2015-06-17 MED ORDER — MIDAZOLAM HCL 2 MG/2ML IJ SOLN
INTRAMUSCULAR | Status: DC | PRN
  Administered 2015-06-17: 2 mg via INTRAVENOUS

## 2015-06-17 MED ORDER — MIDAZOLAM HCL 2 MG/2ML IJ SOLN
INTRAMUSCULAR | Status: AC
  Filled 2015-06-17: qty 4

## 2015-06-17 MED ORDER — SODIUM CHLORIDE 0.9 % IV SOLN: INTRAVENOUS | Status: DC

## 2015-06-17 MED ORDER — PANTOPRAZOLE SODIUM 40 MG PO TBEC
40.0000 mg | DELAYED_RELEASE_TABLET | Freq: Every day | ORAL | Status: DC
  Administered 2015-06-18: 40 mg via ORAL
  Filled 2015-06-17: qty 1

## 2015-06-17 MED ORDER — LIDOCAINE HCL (PF) 1 % IJ SOLN
INTRAMUSCULAR | Status: AC
  Filled 2015-06-17: qty 30

## 2015-06-17 MED ORDER — SODIUM CHLORIDE 0.9 % IJ SOLN: 3.0000 mL | INTRAMUSCULAR | Status: DC | PRN

## 2015-06-17 MED ORDER — SODIUM CHLORIDE 0.9 % IV SOLN
INTRAVENOUS | Status: DC
  Administered 2015-06-17: 07:00:00 via INTRAVENOUS

## 2015-06-17 MED ORDER — FAMOTIDINE IN NACL 20-0.9 MG/50ML-% IV SOLN
INTRAVENOUS | Status: DC | PRN
  Administered 2015-06-17: 20 mg via INTRAVENOUS

## 2015-06-17 MED ORDER — SODIUM CHLORIDE 0.9 % IV SOLN: 250.0000 mL | INTRAVENOUS | Status: DC | PRN

## 2015-06-17 MED ORDER — ONDANSETRON HCL 4 MG/2ML IJ SOLN: 4.0000 mg | Freq: Four times a day (QID) | INTRAMUSCULAR | Status: DC | PRN

## 2015-06-17 MED ORDER — HEPARIN (PORCINE) IN NACL 2-0.9 UNIT/ML-% IJ SOLN
INTRAMUSCULAR | Status: AC
  Filled 2015-06-17: qty 1000

## 2015-06-17 MED ORDER — LISINOPRIL 10 MG PO TABS
10.0000 mg | ORAL_TABLET | Freq: Every day | ORAL | Status: DC
  Administered 2015-06-18: 10 mg via ORAL
  Filled 2015-06-17: qty 1

## 2015-06-17 MED ORDER — HEPARIN SODIUM (PORCINE) 1000 UNIT/ML IJ SOLN
INTRAMUSCULAR | Status: DC | PRN
  Administered 2015-06-17: 4000 [IU] via INTRAVENOUS
  Administered 2015-06-17: 5000 [IU] via INTRAVENOUS

## 2015-06-17 MED ORDER — FENTANYL CITRATE (PF) 100 MCG/2ML IJ SOLN
INTRAMUSCULAR | Status: AC
  Filled 2015-06-17: qty 4

## 2015-06-17 MED ORDER — ASPIRIN EC 81 MG PO TBEC
81.0000 mg | DELAYED_RELEASE_TABLET | Freq: Every day | ORAL | Status: DC
  Administered 2015-06-18: 81 mg via ORAL
  Filled 2015-06-17: qty 1

## 2015-06-17 MED ORDER — FENTANYL CITRATE (PF) 100 MCG/2ML IJ SOLN
INTRAMUSCULAR | Status: DC | PRN
  Administered 2015-06-17: 25 ug via INTRAVENOUS

## 2015-06-17 MED ORDER — NITROGLYCERIN 1 MG/10 ML FOR IR/CATH LAB
INTRA_ARTERIAL | Status: AC
  Filled 2015-06-17: qty 10

## 2015-06-17 MED ORDER — CLOPIDOGREL BISULFATE 75 MG PO TABS
75.0000 mg | ORAL_TABLET | Freq: Every day | ORAL | Status: DC
  Administered 2015-06-18: 08:00:00 75 mg via ORAL
  Filled 2015-06-17: qty 1

## 2015-06-17 MED ORDER — CLOPIDOGREL BISULFATE 75 MG PO TABS
ORAL_TABLET | ORAL | Status: DC | PRN
  Administered 2015-06-17: 600 mg via ORAL

## 2015-06-17 MED ORDER — VERAPAMIL HCL 2.5 MG/ML IV SOLN
INTRAVENOUS | Status: AC
  Filled 2015-06-17: qty 2

## 2015-06-17 MED ORDER — ASPIRIN 81 MG PO CHEW: 81.0000 mg | CHEWABLE_TABLET | ORAL | Status: DC

## 2015-06-17 SURGICAL SUPPLY — 19 items
BALLN EUPHORA RX 2.5X15 (BALLOONS) ×2
BALLN ~~LOC~~ EUPHORA RX 3.25X15 (BALLOONS) ×2
BALLOON EUPHORA RX 2.5X15 (BALLOONS) ×1 IMPLANT
BALLOON ~~LOC~~ EUPHORA RX 3.25X15 (BALLOONS) ×1 IMPLANT
CATH INFINITI 5 FR JL3.5 (CATHETERS) ×2 IMPLANT
CATH INFINITI JR4 5F (CATHETERS) ×2 IMPLANT
CATH LAUNCHER 6FR AL1 (CATHETERS) ×1 IMPLANT
CATHETER LAUNCHER 6FR AL1 (CATHETERS) ×2
DEVICE RAD COMP TR BAND LRG (VASCULAR PRODUCTS) ×2 IMPLANT
GLIDESHEATH SLEND SS 6F .021 (SHEATH) ×2 IMPLANT
KIT ENCORE 26 ADVANTAGE (KITS) ×2 IMPLANT
KIT HEART LEFT (KITS) ×2 IMPLANT
PACK CARDIAC CATHETERIZATION (CUSTOM PROCEDURE TRAY) ×2 IMPLANT
STENT RESOLUTE INTEG 3.0X18 (Permanent Stent) ×2 IMPLANT
SYR MEDRAD MARK V 150ML (SYRINGE) ×2 IMPLANT
TRANSDUCER W/STOPCOCK (MISCELLANEOUS) ×2 IMPLANT
TUBING CIL FLEX 10 FLL-RA (TUBING) ×2 IMPLANT
WIRE COUGAR XT STRL 190CM (WIRE) ×2 IMPLANT
WIRE SAFE-T 1.5MM-J .035X260CM (WIRE) ×2 IMPLANT

## 2015-06-17 NOTE — Progress Notes (Signed)
TR BAND REMOVAL  LOCATION:    right radial  DEFLATED PER PROTOCOL:    Yes.    TIME BAND OFF / DRESSING APPLIED:    1430  SITE UPON ARRIVAL:    Level 0  SITE AFTER BAND REMOVAL:    Level 0  CIRCULATION SENSATION AND MOVEMENT:    Within Normal Limits   Yes.    COMMENTS:   Rechecked site at 1500 and frequently throughout shift with no change in assessment.

## 2015-06-17 NOTE — H&P (View-Only) (Signed)
Cardiology Office Note  Date: 06/04/2015   ID: Pearla DubonnetDonald W Dallaire, DOB 1945-12-05, MRN [id number]  PCP: Sharon SellerHemberg, Katherine NP  Primary Cardiologist: Nona DellSamuel McDowell, MD   Chief Complaint  Patient presents with  . Follow-up testing    History of Present Illness: Iran PlanasDonald W Fillingim is a 69 y.o. male seen recently in consultation in September. He has been experiencing progressive angina symptoms as well as indigestion despite antireflux measures. Cardiac history is not well-defined, he reports having had PCI in 1999, possibly stent to the LAD based on description, but I could not actually ever find the old catheterization report. We referred him for a follow-up Cardiolite study which is outlined below revealing LVEF 49% with inferior septal and anteroseptal scar associated with mild peri-infarct ischemia. We discussed these results today.  He continues to have intermittent chest tightness and indigestion. He has been using nitroglycerin with benefit at times.  Today we discussed the risks and benefits of a diagnostic cardiac catheterization to assess coronary anatomy and evaluate for revascularization options. He is in agreement to proceed, but would like to defer until the week after next.   Past Medical History  Diagnosis Date  . Coronary artery disease     Cardiac catherization 1999 - stent to "front" of heart  . Essential hypertension   . GERD (gastroesophageal reflux disease)   . Hyperlipidemia   . Type 2 diabetes mellitus (HCC)   . History of Helicobacter pylori infection   . Vitamin D deficiency   . Statin intolerance     History reviewed. No pertinent past surgical history.  Current Outpatient Prescriptions  Medication Sig Dispense Refill  . Ascorbic Acid (VITAMIN C) 1000 MG tablet Take 1,000 mg by mouth daily.    Marland Kitchen. aspirin EC 81 MG tablet Take 81 mg by mouth daily.    . Cyanocobalamin (VITAMIN B 12 PO) Take by mouth daily.    . Esomeprazole Magnesium (NEXIUM PO) Take by  mouth daily.    . hydrochlorothiazide (HYDRODIURIL) 25 MG tablet Take 1 tablet by mouth daily.    Marland Kitchen. lisinopril (PRINIVIL,ZESTRIL) 10 MG tablet Take 1 tablet by mouth daily.    . metFORMIN (GLUCOPHAGE) 500 MG tablet Take 500 mg by mouth daily with breakfast.    . metoprolol succinate (TOPROL-XL) 50 MG 24 hr tablet Take 25 mg by mouth daily. Take with or immediately following a meal.    . Multiple Vitamins-Minerals (CENTRUM SILVER PO) Take by mouth daily.    . nitroGLYCERIN (NITROSTAT) 0.4 MG SL tablet Place 0.4 mg under the tongue every 5 (five) minutes as needed for chest pain.     No current facility-administered medications for this visit.    Allergies:  Fenofibrate   Social History: The patient  reports that he has been smoking Cigarettes.  He has a 25 pack-year smoking history. He has never used smokeless tobacco. He reports that he drinks alcohol. He reports that he does not use illicit drugs.   ROS:  Please see the history of present illness. Otherwise, complete review of systems is positive for arthritis.  All other systems are reviewed and negative.   Physical Exam: VS:  BP 128/64 mmHg  Pulse 69  Ht 5\' 11"  (1.803 m)  Wt 196 lb (88.905 kg)  BMI 27.35 kg/m2  SpO2 98%, BMI Body mass index is 27.35 kg/(m^2).  Wt Readings from Last 3 Encounters:  06/04/15 196 lb (88.905 kg)  05/08/15 198 lb (89.812 kg)  10/06/13 203 lb 11.3 oz (  92.4 kg)     General: Patient appears comfortable at rest. HEENT: Conjunctiva and lids normal, oropharynx clear. Neck: Supple, no elevated JVP or carotid bruits, no thyromegaly. Lungs: Clear to auscultation, nonlabored breathing at rest. Cardiac: Regular rate and rhythm, no S3 or significant systolic murmur, no pericardial rub. Abdomen: Soft, nontender, bowel sounds present. Extremities: No pitting edema, distal pulses 2+. Skin: Warm and dry. Musculoskeletal: No kyphosis. Neuropsychiatric: Alert and oriented x3, affect grossly appropriate.   ECG:  ECG is not ordered today.   Recent Labwork:  04/21/2015: Hemoglobin 14.6, platelets 236, BUN 18, creatinine 1.2, potassium 3.9, AST 15, ALT 13, hemoglobin A1c 6.9, total cholesterol 243, triglycerides 291, HDL 25, LDL 160.  Other Studies Reviewed Today:  Lexiscan Cardiolite 05/13/2015:  There was no significant ST segment deviation noted during stress.  Moderate-sized defect of moderate to severe intensity noted in the apical anteroseptal, septal, and inferior septal distribution with extension of the inferior defect into the mid and basal LV segment. There is partial reversibility noted near the apex, overall suggests inferior septal and anteroseptal scar with mild apical peri-infarct ischemia.  This is an intermediate risk study.  Nuclear stress EF: 49%.  Assessment and Plan:  1. Progressing angina symptoms as well as indigestion in the face of antireflux measures and medical therapy for CAD. As discussed above, details of cardiac history are not clear, he possibly had a stent to the LAD back in 1999. Recent follow-up Cardiolite was abnormal and overall intermediate risk. We have discussed the risks and benefits of a diagnostic cardiac catheterization for evaluation of coronary anatomy and revascularization options. He is in agreement to proceed, but would like to defer until the week after next.  2. Hyperlipidemia with statin intolerance. He is adamantly opposed to trying any additional statin medications, we may be able to convince him to try Zetia or have a referral to the lipid clinic.  3. Essential hypertension, blood pressure control is reasonable today.  Current medicines were reviewed with the patient today.  Disposition: FU with me in 1 month.   Signed, Samuel G. McDowell, MD, FACC 06/04/2015 11:54 AM    [facility] [place] [address], [place], [place] [address] Phone: [phone]; Fax: [phone]  

## 2015-06-17 NOTE — Interval H&P Note (Signed)
Cath Lab Visit (complete for each Cath Lab visit)  Clinical Evaluation Leading to the Procedure:   ACS: No.  Non-ACS:    Anginal Classification: CCS III  Anti-ischemic medical therapy: Minimal Therapy (1 class of medications)  Non-Invasive Test Results: Intermediate-risk stress test findings: cardiac mortality 1-3%/year  Prior CABG: No previous CABG      History and Physical Interval Note:  06/17/2015 8:45 AM  Iran Planasonald W Harewood  has presented today for surgery, with the diagnosis of cad, abnormal cardiolight  The various methods of treatment have been discussed with the patient and family. After consideration of risks, benefits and other options for treatment, the patient has consented to  Procedure(s): Left Heart Cath and Coronary Angiography (N/A) as a surgical intervention .  The patient's history has been reviewed, patient examined, no change in status, stable for surgery.  I have reviewed the patient's chart and labs.  Questions were answered to the patient's satisfaction.     Tonny Bollmanooper, Leanord Thibeau

## 2015-06-18 DIAGNOSIS — I25119 Atherosclerotic heart disease of native coronary artery with unspecified angina pectoris: Secondary | ICD-10-CM | POA: Diagnosis not present

## 2015-06-18 DIAGNOSIS — I25110 Atherosclerotic heart disease of native coronary artery with unstable angina pectoris: Secondary | ICD-10-CM | POA: Diagnosis not present

## 2015-06-18 DIAGNOSIS — I2584 Coronary atherosclerosis due to calcified coronary lesion: Secondary | ICD-10-CM | POA: Diagnosis not present

## 2015-06-18 DIAGNOSIS — I2582 Chronic total occlusion of coronary artery: Secondary | ICD-10-CM | POA: Diagnosis not present

## 2015-06-18 DIAGNOSIS — I2510 Atherosclerotic heart disease of native coronary artery without angina pectoris: Secondary | ICD-10-CM | POA: Diagnosis present

## 2015-06-18 DIAGNOSIS — Z955 Presence of coronary angioplasty implant and graft: Secondary | ICD-10-CM | POA: Diagnosis not present

## 2015-06-18 DIAGNOSIS — E785 Hyperlipidemia, unspecified: Secondary | ICD-10-CM | POA: Diagnosis not present

## 2015-06-18 DIAGNOSIS — I10 Essential (primary) hypertension: Secondary | ICD-10-CM

## 2015-06-18 LAB — CBC
HCT: 43.2 % (ref 39.0–52.0)
Hemoglobin: 15 g/dL (ref 13.0–17.0)
MCH: 31.4 pg (ref 26.0–34.0)
MCHC: 34.7 g/dL (ref 30.0–36.0)
MCV: 90.4 fL (ref 78.0–100.0)
Platelets: 168 10*3/uL (ref 150–400)
RBC: 4.78 MIL/uL (ref 4.22–5.81)
RDW: 13.5 % (ref 11.5–15.5)
WBC: 9.7 10*3/uL (ref 4.0–10.5)

## 2015-06-18 LAB — BASIC METABOLIC PANEL
Anion gap: 11 (ref 5–15)
BUN: 12 mg/dL (ref 6–20)
CO2: 25 mmol/L (ref 22–32)
Calcium: 9.1 mg/dL (ref 8.9–10.3)
Chloride: 103 mmol/L (ref 101–111)
Creatinine, Ser: 1.02 mg/dL (ref 0.61–1.24)
GFR calc Af Amer: >60 mL/min (ref >60)
GFR calc non Af Amer: >60 mL/min (ref >60)
Glucose, Bld: 124 mg/dL — ABNORMAL HIGH (ref 65–99)
Potassium: 3.7 mmol/L (ref 3.5–5.1)
Sodium: 139 mmol/L (ref 135–145)

## 2015-06-18 LAB — GLUCOSE, CAPILLARY: Glucose-Capillary: 134 mg/dL — ABNORMAL HIGH (ref 65–99)

## 2015-06-18 MED ORDER — METFORMIN HCL ER 500 MG PO TB24: 500.0000 mg | ORAL_TABLET | Freq: Every day | ORAL | Status: DC

## 2015-06-18 MED ORDER — CLOPIDOGREL BISULFATE 75 MG PO TABS: 75.0000 mg | ORAL_TABLET | Freq: Every day | ORAL | Status: DC

## 2015-06-18 MED FILL — Nitroglycerin IV Soln 100 MCG/ML in D5W: INTRA_ARTERIAL | Qty: 10 | Status: AC

## 2015-06-18 MED FILL — Clopidogrel Bisulfate Tab 300 MG (Base Equiv): ORAL | Qty: 2 | Status: AC

## 2015-06-18 NOTE — Discharge Summary (Signed)
Physician Discharge Summary  Patient ID: Philip Ford MRN: [id number] DOB/AGE: 01/13/1946 69 y.o.   Primary Cardiologist: Dr. Diona Browner  Admit date: 06/17/2015 Discharge date: 06/18/2015  Admission Diagnoses: Angina + Abnormal Stress Test  Discharge Diagnoses:  Active Problems:   HTN (hypertension)   Exertional angina (HCC)   CAD (coronary artery disease), native coronary artery   Dyslipidemia, goal LDL below 70   Discharged Condition: stable  Hospital Course: 69 y/o male, followed by Dr. Diona Browner, who was admitted to [facility] for LHC in the setting of recent progressive anginal symptoms + abnormal nuclear stress test which showed LVEF of 49% with inferior septal and anteroseptal scar associated with mild peri-infarct ischemia. His LHC was performed by Dr. Excell Seltzer. 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 He tolerated the procedure well and left the cath lab in stable condition. He had no post cath complications and no recurrent CP. He was continued on DAPT with ASA + Plavix and BB and ACE-I. He has a h/o statin intolerance. It has been recommended that treatment with a PCSK 9 inhibitor be considered, however this will be determined by Dr. Diona Browner. He was last seen and examined by Dr. Mayford Knife, who determined he was stable for discharge home. He will f/u with Dr. Diona Browner.  Consults: None  Significant Diagnostic Studies:  LHC 06/17/15 Procedures    Left Heart Cath and Coronary Angiography    Conclusion     Prox RCA lesion, 100% stenosed.  LM lesion, 30% stenosed.  1st Mrg lesion, 50% stenosed. The lesion was previously treated with a stent (unknown type)greater than two  years ago.  Prox Cx lesion, 50% stenosed.  Mid LAD lesion, 95% stenosed. Post intervention, there is a 0% residual stenosis.  1. Patency of the left mainstem with minor nonobstructive stenosis 2. Severe stenosis of the mid LAD, treated successfully with PCI using a single drug-eluting stents 3. Moderate in-stent restenosis in the left circumflex involving the first obtuse marginal branch 4. 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 Note the patient is statin intolerant. He should receive dual antiplatelet therapy with aspirin and Plavix for at least 12 months.    Treatments: See Hospital Course  Discharge Exam: Blood pressure 121/71, pulse 87, temperature 98 F (36.7 C), temperature source Oral, resp. rate 16, height  (1.803 m), weight 202 lb 9.6 oz (91.9 kg), SpO2 92 %.  Disposition: 01-Home or Self Care      Discharge Instructions    Amb Referral to Cardiac Rehabilitation    Complete by:  As directed   Diagnosis:  PCI     Diet - low sodium heart healthy    Complete by:  As directed      Increase activity slowly    Complete by:  As directed             Medication List    TAKE these medications        aspirin EC 81 MG tablet  Take 81 mg by mouth daily.     CENTRUM SILVER PO  Take 1 tablet by mouth daily.     clopidogrel 75  MG tablet  Commonly known as:  PLAVIX  Take 1 tablet (75 mg total) by mouth daily with breakfast.  Notes to Patient:  NEW MEDICINE     hydrochlorothiazide 25 MG tablet  Commonly known as:  HYDRODIURIL  Take 25 mg by mouth daily.     lisinopril 10 MG tablet  Commonly known as:  PRINIVIL,ZESTRIL  Take 10 mg by mouth daily.     metFORMIN 500 MG 24 hr tablet  Commonly known as:  GLUCOPHAGE-XR  Take 1 tablet (500 mg total) by mouth daily.  Start taking on:  06/19/2015     metoprolol succinate 50 MG 24 hr tablet  Commonly  known as:  TOPROL-XL  Take 25 mg by mouth daily. Take with or immediately following a meal.     NEXIUM PO  Take 1 tablet by mouth daily.     nitroGLYCERIN 0.4 MG SL tablet  Commonly known as:  NITROSTAT  Place 0.4 mg under the tongue every 5 (five) minutes as needed for chest pain.     vitamin C 1000 MG tablet  Take 1,000 mg by mouth daily.       Follow-up Information    Follow up with Nona DellSamuel McDowell, MD.   Specialty:  Cardiology   Why:  our office will call you with a follow-up appointment to see Dr. Rolm BaptiseMcDowell   Contact information:   [address] [place] [address] [phone]     TIME SPENT ON DISCHARGE, INCLUDING PHYSICIAN TIME: >30 MINUTES  Signed: Robbie LisSIMMONS, BRITTAINY 06/18/2015, 2:36 PM

## 2015-06-18 NOTE — Progress Notes (Signed)
CARDIAC REHAB PHASE I   PRE:  Rate/Rhythm: 91 SR PVCs  BP:  Supine: 121/71  Sitting:   Standing:    SaO2:   MODE:  Ambulation: 800 ft   POST:  Rate/Rhythm: 98 SR PVCs  BP:  Supine:   Sitting:   Standing: 160/70   SaO2:  0800-0845 Pt walked 800 ft with steady gait. No CP. Tolerated well. Education completed with pt and wife who voiced understanding. Stressed importance of plavix with stent. Discussed carb counting and gave heart healthy and diabetic diets. Pt has attended [facility] [place] before and [facility] but stated will probably not do it again. Has treadmill and riding bike and wants to ex on his own.   Luetta Nuttingharlene Ladeja Pelham, RN BSN  06/18/2015 8:41 AM

## 2015-06-18 NOTE — Progress Notes (Signed)
SUBJECTIVE:  No complaints  OBJECTIVE:   Vitals:   Filed Vitals:   06/17/15 1953 06/17/15 2003 06/18/15 0336 06/18/15 0756  BP:  149/54 132/65 121/71  Pulse:  82 77 87  Temp: 98 F (36.7 C) 98 F (36.7 C) 98.4 F (36.9 C) 98 F (36.7 C)  TempSrc: Oral Oral Oral Oral  Resp:  Height:      Weight:   202 lb 9.6 oz (91.9 kg)   SpO2:  96% 92% 92%   I&O's:   Intake/Output Summary (Last 24 hours) at 06/18/15 0854 Last data filed at 06/18/15 0700  Gross per 24 hour  Intake    780 ml  Output    700 ml  Net     80 ml   TELEMETRY: Reviewed telemetry pt in NSR:     PHYSICAL EXAM General: Well developed, well nourished, in no acute distress Head: Eyes PERRLA, No xanthomas.   Normal cephalic and atramatic  Lungs:   Clear bilaterally to auscultation and percussion. Heart:   HRRR S1 S2 Pulses are 2+ & equal. Abdomen: Bowel sounds are positive, abdomen soft and non-tender without masses  Extremities:   No clubbing, cyanosis or edema.  DP +1 Neuro: Alert and oriented X 3. Psych:  Good affect, responds appropriately   LABS: Basic Metabolic Panel:  Recent Labs  66/44/03 0657 06/18/15 0539  NA 135 139  K 3.7 3.7  CL 97* 103  CO2 28 25  GLUCOSE 123* 124*  BUN 11 12  CREATININE 1.06 1.02  CALCIUM 9.9 9.1   Liver Function Tests: No results for input(s): AST, ALT, ALKPHOS, BILITOT, PROT, ALBUMIN in the last 72 hours. No results for input(s): LIPASE, AMYLASE in the last 72 hours. CBC:  Recent Labs  06/17/15 0657 06/18/15 0539  WBC 12.8* 9.7  HGB 14.9 15.0  HCT 43.7 43.2  MCV 91.0 90.4  PLT 196 168   Cardiac Enzymes: No results for input(s): CKTOTAL, CKMB, CKMBINDEX, TROPONINI in the last 72 hours. BNP: Invalid input(s): POCBNP D-Dimer: No results for input(s): DDIMER in the last 72 hours. Hemoglobin A1C: No results for input(s): HGBA1C in the last 72 hours. Fasting Lipid Panel: No results for input(s): CHOL, HDL, LDLCALC, TRIG, CHOLHDL, LDLDIRECT  in the last 72 hours. Thyroid Function Tests: No results for input(s): TSH, T4TOTAL, T3FREE, THYROIDAB in the last 72 hours.  Invalid input(s): FREET3 Anemia Panel: No results for input(s): VITAMINB12, FOLATE, FERRITIN, TIBC, IRON, RETICCTPCT in the last 72 hours. Coag Panel:   Lab Results  Component Value Date   INR 1.04 06/17/2015   INR 1.02 09/27/2013    RADIOLOGY: No results found.   Assessment and Plan: 1.  ASCAD presenting with angina.  Cath showed Prox RCA lesion, 100% stenosed. LM lesion, 30% stenosed. 1st Mrg lesion, 50% stenosed. The lesion was previously treated with a stent (unknown type) greater than two years ago. Prox Cx lesion, 50% stenosed. Mid LAD lesion, 95% stenosed. S/P PCI of the LADwith DES.  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  Continue DAPT with ASA and Plavix.  He is statin intolerant so recommend consideration of PCSK 9 inhibitor as outpt.  Will leave decision to Dr. Diona Browner.  Continue BB and ACE I.  2. Hyperlipidemia with statin intolerance. He is adamantly opposed to trying any additional statin medications, we may be able to convince him to try Zetia or have a referral to the  lipid clinic for consideration of PCSK 9 inhibitor  3. Essential hypertension, blood pressure control is reasonable today.  Patient is stable from a cardiac standpoint for discharge home.  Followup with Dr. Diona BrownerMcDowell.  Quintella ReichertURNER,Lee Kuang R, MD  06/18/2015  8:54 AM

## 2015-07-06 ENCOUNTER — Ambulatory Visit (INDEPENDENT_AMBULATORY_CARE_PROVIDER_SITE_OTHER): Payer: Medicare Other | Admitting: Cardiology

## 2015-07-06 ENCOUNTER — Encounter: Payer: Self-pay | Admitting: Cardiology

## 2015-07-06 VITALS — BP 139/72 | HR 73 | Ht 71.0 in | Wt 201.6 lb

## 2015-07-06 DIAGNOSIS — E782 Mixed hyperlipidemia: Secondary | ICD-10-CM | POA: Diagnosis not present

## 2015-07-06 DIAGNOSIS — I2510 Atherosclerotic heart disease of native coronary artery without angina pectoris: Secondary | ICD-10-CM | POA: Diagnosis not present

## 2015-07-06 DIAGNOSIS — I10 Essential (primary) hypertension: Secondary | ICD-10-CM | POA: Diagnosis not present

## 2015-07-06 NOTE — Addendum Note (Signed)
Addended by: Eustace MooreANDERSON, Cyani Kallstrom M on: 07/06/2015 03:14 PM   Modules accepted: Orders

## 2015-07-06 NOTE — Progress Notes (Signed)
Cardiology Office Note  Date: 07/06/2015   ID: Pearla DubonnetDonald W Tseng, DOB 12/06/1945, MRN [id number]  PCP: Rebecka ApleyHEMBERG, KATHERINE V, RN  Primary Cardiologist: Nona DellSamuel McDowell, MD   Chief Complaint  Patient presents with  . Coronary Artery Disease    History of Present Illness: Iran PlanasDonald W Knoll is a 69 y.o. male seen for the first time back in September, subsequently referred for a cardiac catheterization in November following an abnormal Cardiolite. Results are detailed below, he underwent placement of DES to the mid LAD, and 50% in-stent restenosis within the first obtuse marginal, and occluded RCA with right to right collaterals.  He comes in today stating that he feels much better. He reports compliance with his medications which are outlined below. No bleeding problems on DAPT.  He has a history of significant statin intolerance, also allergy to fenofibrate. Lipid panel in September revealed LDL 160, total cholesterol 243. I talked with him about other potential options for more aggressive treatment of hyperlipidemia, and we plan to refer him to the lipid clinic for evaluation.   Past Medical History  Diagnosis Date  . Coronary artery disease     BMS to OM1 1999, DES to mid LAD 06/2015  . Essential hypertension   . GERD (gastroesophageal reflux disease)   . Hyperlipidemia   . History of Helicobacter pylori infection   . Vitamin D deficiency   . Statin intolerance   . History of skin cancer   . Type II diabetes mellitus (HCC)     Current Outpatient Prescriptions  Medication Sig Dispense Refill  . Ascorbic Acid (VITAMIN C) 1000 MG tablet Take 1,000 mg by mouth daily.    Marland Kitchen. aspirin EC 81 MG tablet Take 81 mg by mouth daily.    . clopidogrel (PLAVIX) 75 MG tablet Take 1 tablet (75 mg total) by mouth daily with breakfast. 30 tablet 5  . Esomeprazole Magnesium (NEXIUM PO) Take 1 tablet by mouth daily.     . hydrochlorothiazide (HYDRODIURIL) 25 MG tablet Take 25 mg by mouth daily.     Marland Kitchen.  lisinopril (PRINIVIL,ZESTRIL) 10 MG tablet Take 10 mg by mouth daily.     . metFORMIN (GLUCOPHAGE-XR) 500 MG 24 hr tablet Take 1 tablet (500 mg total) by mouth daily.    . metoprolol succinate (TOPROL-XL) 50 MG 24 hr tablet Take 25 mg by mouth daily. Take with or immediately following a meal.    . Multiple Vitamins-Minerals (CENTRUM SILVER PO) Take 1 tablet by mouth daily.     . nitroGLYCERIN (NITROSTAT) 0.4 MG SL tablet Place 0.4 mg under the tongue every 5 (five) minutes as needed for chest pain.    . Omega-3 Fatty Acids (FISH OIL) 1000 MG CAPS Take 1 capsule by mouth daily.     No current facility-administered medications for this visit.    Allergies:  Fenofibrate   Social History: The patient  reports that he has been smoking Cigarettes.  He has a 32.5 pack-year smoking history. He has quit using smokeless tobacco. His smokeless tobacco use included Chew. He reports that he drinks alcohol. He reports that he does not use illicit drugs.   ROS:  Please see the history of present illness. Otherwise, complete review of systems is positive for lower back and upper leg pain with ambulation - reports pending orthopedic evaluation.  All other systems are reviewed and negative.   Physical Exam: VS:  BP 139/72 mmHg  Pulse 73  Ht 5\' 11"  (1.803 m)  Wt 201  lb 9.6 oz (91.445 kg)  BMI 28.13 kg/m2  SpO2 99%, BMI Body mass index is 28.13 kg/(m^2).  Wt Readings from Last 3 Encounters:  07/06/15 201 lb 9.6 oz (91.445 kg)  06/18/15 202 lb 9.6 oz (91.9 kg)  06/04/15 196 lb (88.905 kg)     General: Patient appears comfortable at rest. HEENT: Conjunctiva and lids normal, oropharynx clear. Neck: Supple, no elevated JVP or carotid bruits, no thyromegaly. Lungs: Clear to auscultation, nonlabored breathing at rest. Cardiac: Regular rate and rhythm, no S3 or significant systolic murmur, no pericardial rub. Abdomen: Soft, nontender, bowel sounds present. Extremities: No pitting edema, distal pulses  2+. Skin: Warm and dry. Musculoskeletal: No kyphosis. Neuropsychiatric: Alert and oriented x3, affect grossly appropriate.   ECG: ECG is not ordered today.   Recent Labwork: 06/18/2015: BUN 12; Creatinine, Ser 1.02; Hemoglobin 15.0; Platelets 168; Potassium 3.7; Sodium 139   Other Studies Reviewed Today:  Cardiac catheterization 06/17/2015:  Prox RCA lesion, 100% stenosed.  LM lesion, 30% stenosed.  1st Mrg lesion, 50% stenosed. The lesion was previously treated with a stent (unknown type)greater than two years ago.  Prox Cx lesion, 50% stenosed.  Mid LAD lesion, 95% stenosed. Post intervention, there is a 0% residual stenosis.  1. Patency of the left mainstem with minor nonobstructive stenosis 2. Severe stenosis of the mid LAD, treated successfully with PCI using a single drug-eluting stents 3. Moderate in-stent restenosis in the left circumflex involving the first obtuse marginal branch 4. 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 Symptomatically stable CAD status post recent DES to the LAD in November. Plan is to continue medical therapy and observation. He states that he does not want to pursue cardiac rehabilitation, will exercise on his own.  2. Hyperlipidemia with history of significant statin intolerance and allergy to fenofibrate. Referral being made to lipid clinic.  3. Essential hypertension, no changes made to current regimen.  Current medicines were reviewed with the patient today.   Orders Placed This Encounter  Procedures  . Ambulatory referral to Lipid Clinic    Disposition: FU with me in 3 months.   Signed, Jonelle Sidle, MD, [facility] 07/06/2015 2:41 PM    [facility] at [facility] [address] [place], [place], [place] [address] Phone: [phone]; Fax: [phone]

## 2015-07-06 NOTE — Patient Instructions (Signed)
Your physician recommends that you continue on your current medications as directed. Please refer to the Current Medication list given to you today. You have been referred to the Lipid Clinic. Your physician recommends that you schedule a follow-up appointment in: 3 months.

## 2015-07-13 ENCOUNTER — Ambulatory Visit (INDEPENDENT_AMBULATORY_CARE_PROVIDER_SITE_OTHER): Payer: Medicare Other | Admitting: Pharmacist

## 2015-07-13 ENCOUNTER — Encounter: Payer: Medicare Other | Admitting: Pharmacist

## 2015-07-13 DIAGNOSIS — E785 Hyperlipidemia, unspecified: Secondary | ICD-10-CM | POA: Diagnosis not present

## 2015-07-13 MED ORDER — ROSUVASTATIN CALCIUM 5 MG PO TABS: 5.0000 mg | ORAL_TABLET | Freq: Every day | ORAL | Status: DC

## 2015-07-13 NOTE — Patient Instructions (Signed)
Start Crestor 5mg  three days of the week (Monday, Wednesday and Friday).  If you have any problems, please call Kennon RoundsSally at [phone].    Have your primary care doctor send us a copy of your labs in January (fax: [phone])

## 2015-07-13 NOTE — Progress Notes (Signed)
Cardiology Office Note  Date: 07/13/2015   ID: Pearla DubonnetDonald W Davidow, DOB February 09, 1946, MRN [id number]  PCP: Rebecka ApleyHEMBERG, KATHERINE V, RN  Primary Cardiologist: Nona DellSamuel McDowell, MD   Chief Complaint  Patient presents with  . Hyperlipidemia    History of Present Illness: Iran PlanasDonald W Felix is a 69 y.o. male patient of Dr. Diona BrownerMcDowell who was referred to the Lipid Clinic for statin intolerance.  He had a cardiac cath at the beginning of November and underwent placement of DES to the mid LAD, and 50% in-stent restenosis within the first obtuse marginal, and occluded RCA with right to right collaterals.  He has a history of significant statin intolerance.  He states he has tried Lipitor and Crestor.  Each of them caused severe muscle aches and no energy.  Dr. Lysbeth GalasNyland prescribed them and the wife thinks it was the 20mg  tablet for both.  No records available.  He is also intolerant to fenofibrate.  He states it made him "feel funny"  He is currently taking 1-2 gm of fish oil daily.    RF: CAD s/p PCI, DM,  LDL goal < 70 mg/dL Current Therapy: fish oil 1-2 gm/day Intolerances: Crestor (dose unknown), Lipitor (dose unknown), Fenofibrate   Labs:  04/2015- TC 243, TG 291, HDL 25, LDL 160, LFTs normal (no therapy)  Past Medical History  Diagnosis Date  . Coronary artery disease     BMS to OM1 1999, DES to mid LAD 06/2015  . Essential hypertension   . GERD (gastroesophageal reflux disease)   . Hyperlipidemia   . History of Helicobacter pylori infection   . Vitamin D deficiency   . Statin intolerance   . History of skin cancer   . Type II diabetes mellitus (HCC)     Current Outpatient Prescriptions  Medication Sig Dispense Refill  . Ascorbic Acid (VITAMIN C) 1000 MG tablet Take 1,000 mg by mouth daily.    Marland Kitchen. aspirin EC 81 MG tablet Take 81 mg by mouth daily.    . clopidogrel (PLAVIX) 75 MG tablet Take 1 tablet (75 mg total) by mouth daily with breakfast. 30 tablet 5  . Esomeprazole Magnesium (NEXIUM PO)  Take 1 tablet by mouth daily.     . hydrochlorothiazide (HYDRODIURIL) 25 MG tablet Take 25 mg by mouth daily.     Marland Kitchen. lisinopril (PRINIVIL,ZESTRIL) 10 MG tablet Take 10 mg by mouth daily.     . metFORMIN (GLUCOPHAGE-XR) 500 MG 24 hr tablet Take 1 tablet (500 mg total) by mouth daily.    . metoprolol succinate (TOPROL-XL) 50 MG 24 hr tablet Take 25 mg by mouth daily. Take with or immediately following a meal.    . Multiple Vitamins-Minerals (CENTRUM SILVER PO) Take 1 tablet by mouth daily.     . nitroGLYCERIN (NITROSTAT) 0.4 MG SL tablet Place 0.4 mg under the tongue every 5 (five) minutes as needed for chest pain.    . Omega-3 Fatty Acids (FISH OIL) 1000 MG CAPS Take 1 capsule by mouth daily.     No current facility-administered medications for this visit.    Allergies:  Fenofibrate   Social History: The patient  reports that he has been smoking Cigarettes.  He has a 32.5 pack-year smoking history. He has quit using smokeless tobacco. His smokeless tobacco use included Chew. He reports that he drinks alcohol. He reports that he does not use illicit drugs.    Assessment and Plan:  1. Hyperlipidemia- Pt's LDL well above goal.  He has tried  several statins but intolerance most likely related to dose.  Discussed trying Crestor at a low dose versus PCSK-9.  Pt is willing to try Crestor  three days of the week.  If he is able to tolerate, will titrate to highest tolerable dose.  If LDL still above goal, will try to add Zetia.  If pt unable to tolerate Crestor, will start insurance investigation into PCSK-9.  Pt is scheduled to have repeat labs done in January with his PCP.  He will send [place] a copy of these labs.

## 2015-07-28 ENCOUNTER — Other Ambulatory Visit: Payer: Self-pay | Admitting: *Deleted

## 2015-07-28 MED ORDER — ROSUVASTATIN CALCIUM 5 MG PO TABS: 5.0000 mg | ORAL_TABLET | Freq: Every day | ORAL | Status: DC

## 2015-10-08 ENCOUNTER — Encounter: Payer: Self-pay | Admitting: Cardiology

## 2015-10-08 ENCOUNTER — Ambulatory Visit (INDEPENDENT_AMBULATORY_CARE_PROVIDER_SITE_OTHER): Payer: Medicare Other | Admitting: Cardiology

## 2015-10-08 VITALS — BP 142/80 | HR 84 | Ht 71.0 in | Wt 209.2 lb

## 2015-10-08 DIAGNOSIS — E782 Mixed hyperlipidemia: Secondary | ICD-10-CM

## 2015-10-08 DIAGNOSIS — I10 Essential (primary) hypertension: Secondary | ICD-10-CM

## 2015-10-08 DIAGNOSIS — IMO0002 Reserved for concepts with insufficient information to code with codable children: Secondary | ICD-10-CM

## 2015-10-08 DIAGNOSIS — E1159 Type 2 diabetes mellitus with other circulatory complications: Secondary | ICD-10-CM | POA: Diagnosis not present

## 2015-10-08 DIAGNOSIS — E1165 Type 2 diabetes mellitus with hyperglycemia: Secondary | ICD-10-CM

## 2015-10-08 DIAGNOSIS — I2510 Atherosclerotic heart disease of native coronary artery without angina pectoris: Secondary | ICD-10-CM | POA: Diagnosis not present

## 2015-10-08 MED ORDER — FISH OIL 1000 MG PO CAPS: 1.0000 | ORAL_CAPSULE | Freq: Two times a day (BID) | ORAL | Status: DC

## 2015-10-08 NOTE — Progress Notes (Signed)
Cardiology Office Note  Date: 10/08/2015   ID: Nakul, Avino 1945-08-31, MRN [id number]  PCP: Rebecka Apley, RN  Primary Cardiologist: Nona Dell, MD   Chief Complaint  Patient presents with  . Coronary Artery Disease    History of Present Illness: Philip Ford is a 70 y.o. male last seen in November 2016. He presents for a routine follow-up visit. Since last encounter he has not experienced any progressive angina symptoms and reports stable NYHA class II dyspnea. Most recent cardiac catheterization results are outlined below.  He was seen in the lipid clinic in November 2016, placed on low-dose Crestor at that time.  Last LDL was 160 in September 2016. He has not noticed any obvious symptoms so far , had follow-up lipids with PCP in January. Total cholesterol was 213, triglycerides 532, HDL 26, LDL was not calculated. He tells me that he has been taking omega-3 1000 mg once daily.  We went over his medications. Current cardiac regimen includes aspirin, Plavix, HCTZ, lisinopril, Toprol-XL, and as needed nitroglycerin.  We did discuss considering advancing his Crestor to 5 mg once daily, although he did not want to do this as yet. He did agree to increase his omega-3 supplements.  Past Medical History  Diagnosis Date  . Coronary artery disease     BMS to OM1 1999, DES to mid LAD 06/2015  . Essential hypertension   . GERD (gastroesophageal reflux disease)   . Hyperlipidemia   . History of Helicobacter pylori infection   . Vitamin D deficiency   . Statin intolerance   . History of skin cancer   . Type II diabetes mellitus [facility])     Past Surgical History  Procedure Laterality Date  . Cardiac catheterization N/A 06/17/2015    Procedure: Left Heart Cath and Coronary Angiography;  Surgeon: Tonny Bollman, MD;  Location: [facility] INVASIVE CV LAB;  Service: Cardiovascular;  Laterality: N/A;  . Coronary angioplasty with stent placement  1999; 06/17/2015    "2 + 1"  .  Tonsillectomy and adenoidectomy  ~ 1954  . Hernia repair    . Umbilical hernia repair  "?early 2000's"    Current Outpatient Prescriptions  Medication Sig Dispense Refill  . Ascorbic Acid (VITAMIN C) 1000 MG tablet Take 1,000 mg by mouth daily.    Marland Kitchen aspirin EC 81 MG tablet Take 81 mg by mouth daily.    . clopidogrel (PLAVIX) 75 MG tablet Take 1 tablet (75 mg total) by mouth daily with breakfast. 30 tablet 5  . Esomeprazole Magnesium (NEXIUM PO) Take 1 tablet by mouth daily.     . hydrochlorothiazide (HYDRODIURIL) 25 MG tablet Take 25 mg by mouth daily.     Marland Kitchen lisinopril (PRINIVIL,ZESTRIL) 10 MG tablet Take 10 mg by mouth daily.     . metFORMIN (GLUCOPHAGE-XR) 500 MG 24 hr tablet Take 1 tablet (500 mg total) by mouth daily.    . metoprolol succinate (TOPROL-XL) 50 MG 24 hr tablet Take 25 mg by mouth daily. Take with or immediately following a meal.    . Multiple Vitamins-Minerals (CENTRUM SILVER PO) Take 1 tablet by mouth daily.     . nitroGLYCERIN (NITROSTAT) 0.4 MG SL tablet Place 0.4 mg under the tongue every 5 (five) minutes as needed for chest pain.    . Omega-3 Fatty Acids (FISH OIL) 1000 MG CAPS Take 1 capsule by mouth daily.    . rosuvastatin (CRESTOR) 5 MG tablet Take 5 mg by mouth. Monday, Wednesday, &  Friday     No current facility-administered medications for this visit.   Allergies:  Fenofibrate   Social History: The patient  reports that he has been smoking Cigarettes.  He has a 32.5 pack-year smoking history. He has quit using smokeless tobacco. His smokeless tobacco use included Chew. He reports that he drinks alcohol. He reports that he does not use illicit drugs.   ROS:  Please see the history of present illness. Otherwise, complete review of systems is positive for intermittent "aches and pains,"  arthritic symptoms in his knees, reflux.  All other systems are reviewed and negative.   Physical Exam: VS:  BP 142/80 mmHg  Pulse 84  Ht  (1.803 m)  Wt 209 lb 3.2 oz  (94.892 kg)  BMI 29.19 kg/m2  SpO2 97%, BMI Body mass index is 29.19 kg/(m^2).  Wt Readings from Last 3 Encounters:  10/08/15 209 lb 3.2 oz (94.892 kg)  07/06/15 201 lb 9.6 oz (91.445 kg)  06/18/15 202 lb 9.6 oz (91.9 kg)    General: Overweight male, appears comfortable at rest. HEENT: Conjunctiva and lids normal, oropharynx clear. Neck: Supple, no elevated JVP or carotid bruits, no thyromegaly. Lungs: Clear to auscultation, nonlabored breathing at rest. Cardiac: Regular rate and rhythm, no S3 or significant systolic murmur, no pericardial rub. Abdomen: Soft, protuberant, bowel sounds present. Extremities: No pitting edema, distal pulses 2+. Skin: Warm and dry. Musculoskeletal: No kyphosis. Neuropsychiatric: Alert and oriented x3, affect grossly appropriate.  ECG:  I personally reviewed the prior tracing from 06/17/2015 which showed sinus rhythm with nonspecific T-wave changes.  Recent Labwork: 06/18/2015: BUN 12; Creatinine, Ser 1.02; Hemoglobin 15.0; Platelets 168; Potassium 3.7; Sodium 139   Other Studies Reviewed Today:  Cardiac catheterization 06/17/2015:  Prox RCA lesion, 100% stenosed.  LM lesion, 30% stenosed.  1st Mrg lesion, 50% stenosed. The lesion was previously treated with a stent (unknown type)greater than two years ago.  Prox Cx lesion, 50% stenosed.  Mid LAD lesion, 95% stenosed. Post intervention, there is a 0% residual stenosis.  1. Patency of the left mainstem with minor nonobstructive stenosis 2. Severe stenosis of the mid LAD, treated successfully with PCI using a single drug-eluting stents 3. Moderate in-stent restenosis in the left circumflex involving the first obtuse marginal branch 4. 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 Symptomatically stable CAD with known occlusion of the RCA associated with right to right collaterals, and status post DES to the mid LAD in November 2016.   Plan to continue current regimen including DAPT. Discussed diet and weight loss , also regular exercise.  2. Hyperlipidemia, not controlled. Triglycerides were up most recently, unable to assess LDL. He has been taking Crestor 5 mg on Monday, Wednesday, and Friday. He did not want to advance this further as yet. I did recommend that he increase his omega-3 supplements to 1000 mg twice daily as a next step.  3. Essential hypertension, no changes made to lisinopril or Toprol-XL dose.  4. Type 2 diabetes mellitus , hemoglobin A1c 6.7 in January of this year. He reports compliance with his medications. We discussed weight loss and exercise. Keep follow-up with PCP.  Current medicines were reviewed with the patient today.  Disposition: FU with me in 6 months.   Signed, Jonelle Sidle, MD, [facility] 10/08/2015 2:39 PM    [facility] at [facility] [address] [place], [place], [place] [address] Phone: [phone];  Fax: [phone]

## 2015-10-08 NOTE — Patient Instructions (Addendum)
Your physician has recommended you make the following change in your medication:  Increase fish oil to twice daily. Continue all other medications the same. Your physician recommends that you schedule a follow-up appointment in: 6 months. You will receive a reminder letter in the mail in about 4 months reminding you to call and schedule your appointment. If you don't receive this letter, please contact our office.

## 2015-12-23 ENCOUNTER — Other Ambulatory Visit: Payer: Self-pay | Admitting: Cardiology

## 2016-02-08 DIAGNOSIS — E119 Type 2 diabetes mellitus without complications: Secondary | ICD-10-CM | POA: Insufficient documentation

## 2016-05-17 DIAGNOSIS — Z955 Presence of coronary angioplasty implant and graft: Secondary | ICD-10-CM | POA: Insufficient documentation

## 2016-06-17 NOTE — Progress Notes (Signed)
Cardiology Office Note  Date: 06/20/2016   ID: Philip Ford, DOB 19-Sep-1945, MRN [id number]  PCP: Rebecka ApleyHEMBERG, KATHERINE V, RN  Primary Cardiologist: Nona DellSamuel Sitlaly Gudiel, MD   Chief Complaint  Patient presents with  . Coronary Artery Disease  . Hyperlipidemia    History of Present Illness: Philip Ford is a 70 y.o. male last seen in February. He presents for a routine follow-up visit. States that he has had one episode of angina since I saw him, resolved with a single nitroglycerin. He reports no palpitations, NYHA class II dyspnea. Walking is limited by left hip pain, he reports a prior problem with a "slipped disc."  At the last visit I recommended increasing omega-3 supplements to better address hypertriglyceridemia. He was tolerating Crestor at 5 mg on Monday, Wednesday, and Friday at that point. Since that time he tells me that he began to develop worsening leg cramping and essentially stopped all of his lipid medications. He had follow-up lipid panel with PCP back in September showing substantially elevated triglycerides. I reviewed his medications and we discussed stopping Crestor at this time with more focus on bringing down his triglycerides. He was placed on Zetia by PCP, we will also ask him to make sure that he is taking fenofibrate and omega-3 supplements at 2000 mg twice daily. I spoke with him about limiting sugars and carbohydrates, which seem to be a fairly prominent portion of his diet.  I reviewed his ECG today which shows normal sinus rhythm.  Past Medical History:  Diagnosis Date  . Coronary artery disease    BMS to OM1 1999, DES to mid LAD 06/2015  . Essential hypertension   . GERD (gastroesophageal reflux disease)   . History of Helicobacter pylori infection   . History of skin cancer   . Hyperlipidemia   . Statin intolerance   . Type II diabetes mellitus (HCC)   . Vitamin D deficiency     Past Surgical History:  Procedure Laterality Date  . CARDIAC  CATHETERIZATION N/A 06/17/2015   Procedure: Left Heart Cath and Coronary Angiography;  Surgeon: Tonny BollmanMichael Cooper, MD;  [facility];  Service: Cardiovascular;  Laterality: N/A;  . CORONARY ANGIOPLASTY WITH STENT PLACEMENT  1999; 06/17/2015   "2 + 1"  . HERNIA REPAIR    . TONSILLECTOMY AND ADENOIDECTOMY  ~ 1954  . UMBILICAL HERNIA REPAIR  "?early 2000's"    Current Outpatient Prescriptions  Medication Sig Dispense Refill  . Ascorbic Acid (VITAMIN C) 1000 MG tablet Take 1,000 mg by mouth daily.    Marland Kitchen. aspirin EC 81 MG tablet Take 81 mg by mouth daily.    . clopidogrel (PLAVIX) 75 MG tablet TAKE 1 TABLET EVERY DAY WITH BREAKFAST 30 tablet 6  . ezetimibe (ZETIA) 10 MG tablet Take 10 mg by mouth daily.    . fenofibrate (TRICOR) 145 MG tablet Take 145 mg by mouth daily.    . hydrochlorothiazide (HYDRODIURIL) 25 MG tablet Take 25 mg by mouth daily.     Marland Kitchen. lisinopril (PRINIVIL,ZESTRIL) 10 MG tablet Take 10 mg by mouth daily.     . metFORMIN (GLUCOPHAGE-XR) 500 MG 24 hr tablet Take 1 tablet (500 mg total) by mouth daily.    . metoprolol succinate (TOPROL-XL) 50 MG 24 hr tablet Take 25 mg by mouth daily. Take with or immediately following a meal.    . Multiple Vitamins-Minerals (CENTRUM SILVER PO) Take 1 tablet by mouth daily.     . nitroGLYCERIN (NITROSTAT) 0.4  MG SL tablet Place 0.4 mg under the tongue every 5 (five) minutes as needed for chest pain.    . Omega-3 1000 MG CAPS Take 2 capsules by mouth 2 (two) times daily.    Marland Kitchen omeprazole (PRILOSEC) 20 MG capsule Take 1 capsule (20 mg total) by mouth daily. 90 capsule 3   No current facility-administered medications for this visit.    Allergies:  Patient has no active allergies.   Social History: The patient  reports that he has been smoking Cigarettes.  He has a 32.50 pack-year smoking history. He has quit using smokeless tobacco. His smokeless tobacco use included Chew. He reports that he drinks alcohol. He reports that he does not use  drugs.   ROS:  Please see the history of present illness. Otherwise, complete review of systems is positive for decreased hearing.  All other systems are reviewed and negative.   Physical Exam: VS:  BP 140/88   Pulse 75   Ht 5\' 11"  (1.803 m)   Wt 209 lb (94.8 kg)   SpO2 95%   BMI 29.15 kg/m , BMI Body mass index is 29.15 kg/m.  Wt Readings from Last 3 Encounters:  06/20/16 209 lb (94.8 kg)  10/08/15 209 lb 3.2 oz (94.9 kg)  07/06/15 201 lb 9.6 oz (91.4 kg)    General: Overweight male, appears comfortable at rest. HEENT: Conjunctiva and lids normal, oropharynx clear. Neck: Supple, no elevated JVP or carotid bruits, no thyromegaly. Lungs: Clear to auscultation, nonlabored breathing at rest. Cardiac: Regular rate and rhythm, no S3 or significant systolic murmur, no pericardial rub. Abdomen: Soft, protuberant, bowel sounds present. Extremities: No pitting edema, distal pulses 2+. Skin: Warm and dry. Musculoskeletal: No kyphosis. Neuropsychiatric: Alert and oriented x3, affect grossly appropriate.  ECG: I personally reviewed the tracing from 06/17/2015 which showed normal sinus rhythm with nonspecific T-wave flattening.  Recent Labwork:  January 2017: Cholesterol 213, triglycerides 532, HDL 26, BUN 14, creatinine 1.0, potassium 4.8, AST 31, ALT 17, hemoglobin 15.3, platelets 220 September 2017: Cholesterol 273, triglycerides 1219, HDL 23  Other Studies Reviewed Today:  Cardiac catheterization 06/17/2015:  Prox RCA lesion, 100% stenosed.  LM lesion, 30% stenosed.  1st Mrg lesion, 50% stenosed. The lesion was previously treated with a stent (unknown type)greater than two years ago.  Prox Cx lesion, 50% stenosed.  Mid LAD lesion, 95% stenosed. Post intervention, there is a 0% residual stenosis.  1. Patency of the left mainstem with minor nonobstructive stenosis 2. Severe stenosis of the mid LAD, treated successfully with PCI using a single drug-eluting stents 3. Moderate  in-stent restenosis in the left circumflex involving the first obtuse marginal branch 4. 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 CAD status post DES to the mid LAD in November 2016 with known chronic occlusion of the RCA associated with right to right collaterals and moderate in-stent restenosis involving the circumflex/OM. He has had one episode of angina since I last saw him and we will continue with medical therapy and observation for now. ECG today is normal.  2. Mixed hyperlipidemia, predominantly hypertriglyceridemia at this time. We discussed the associated risks and I went over medication compliance as well as diet interventions with him specifically today. Since he is having problems with Crestor with known statin intolerance I will plan to stop this for now. Agree with starting Zetia, but it will be more important for him to take fenofibrate and increase his  omega-3 supplements to 2000 mg twice daily. Follow-up FLP in 2 months. I can always refer him back to the lipid clinic as well.  3. Essential hypertension, on lisinopril as well as Toprol-XL. No changes made today.  4. Type 2 diabetes mellitus, followed by PCP on Glucophage. Hemoglobin A1c 7.2 in September.  Current medicines were reviewed with the patient today.   Orders Placed This Encounter  Procedures  . Lipid panel  . EKG 12-Lead    Disposition: Follow-up in 6 months.  Signed, Jonelle SidleSamuel G. Zyion Doxtater, MD, [facility] 06/20/2016 8:28 AM    [facility] [address], [place], [place] [address] Phone: [phone]; Fax: [phone]

## 2016-06-20 ENCOUNTER — Encounter: Payer: Self-pay | Admitting: Cardiology

## 2016-06-20 ENCOUNTER — Ambulatory Visit (INDEPENDENT_AMBULATORY_CARE_PROVIDER_SITE_OTHER): Payer: Medicare Other | Admitting: Cardiology

## 2016-06-20 VITALS — BP 140/88 | HR 75 | Ht 71.0 in | Wt 209.0 lb

## 2016-06-20 DIAGNOSIS — E1159 Type 2 diabetes mellitus with other circulatory complications: Secondary | ICD-10-CM

## 2016-06-20 DIAGNOSIS — I10 Essential (primary) hypertension: Secondary | ICD-10-CM

## 2016-06-20 DIAGNOSIS — E782 Mixed hyperlipidemia: Secondary | ICD-10-CM

## 2016-06-20 DIAGNOSIS — IMO0002 Reserved for concepts with insufficient information to code with codable children: Secondary | ICD-10-CM

## 2016-06-20 DIAGNOSIS — E1165 Type 2 diabetes mellitus with hyperglycemia: Secondary | ICD-10-CM

## 2016-06-20 DIAGNOSIS — I2510 Atherosclerotic heart disease of native coronary artery without angina pectoris: Secondary | ICD-10-CM

## 2016-06-20 MED ORDER — OMEPRAZOLE 20 MG PO CPDR: 20.0000 mg | DELAYED_RELEASE_CAPSULE | Freq: Every day | ORAL | 3 refills | Status: DC

## 2016-06-20 NOTE — Patient Instructions (Signed)
Your physician wants you to follow-up in: 6 MONTHS WITH DR. Diona BrownerMCDOWELL You will receive a reminder letter in the mail two months in advance. If you don't receive a letter, please call our office to schedule the follow-up appointment.  Your physician has recommended you make the following change in your medication:   STOP CRESTOR   CONTINUE TAKING FENOFIBRATE   CONTINUE OMEGA FISH OIL 1000 MG 2 TABS TWICE DAILY  CONTINUE ZETIA 10 MG DAILY  Your physician recommends that you return for lab work in: 2 MONTHS LIPIDS   Thank you for choosing [facility]!!

## 2016-07-27 ENCOUNTER — Other Ambulatory Visit: Payer: Self-pay | Admitting: Cardiology

## 2016-08-12 ENCOUNTER — Other Ambulatory Visit: Payer: Self-pay | Admitting: *Deleted

## 2016-08-12 MED ORDER — NITROGLYCERIN 0.4 MG SL SUBL
0.4000 mg | SUBLINGUAL_TABLET | SUBLINGUAL | 3 refills | Status: DC | PRN
Start: 2016-08-12 — End: 2017-12-08

## 2017-03-05 ENCOUNTER — Other Ambulatory Visit: Payer: Self-pay | Admitting: Cardiology

## 2017-06-09 DIAGNOSIS — F17210 Nicotine dependence, cigarettes, uncomplicated: Secondary | ICD-10-CM | POA: Insufficient documentation

## 2017-06-13 ENCOUNTER — Encounter: Payer: Self-pay | Admitting: *Deleted

## 2017-06-14 ENCOUNTER — Encounter: Payer: Self-pay | Admitting: Cardiology

## 2017-06-14 ENCOUNTER — Ambulatory Visit (INDEPENDENT_AMBULATORY_CARE_PROVIDER_SITE_OTHER): Payer: Medicare Other | Admitting: Cardiology

## 2017-06-14 VITALS — BP 126/70 | HR 89 | Ht 71.0 in | Wt 199.6 lb

## 2017-06-14 DIAGNOSIS — E118 Type 2 diabetes mellitus with unspecified complications: Secondary | ICD-10-CM | POA: Diagnosis not present

## 2017-06-14 DIAGNOSIS — E782 Mixed hyperlipidemia: Secondary | ICD-10-CM | POA: Diagnosis not present

## 2017-06-14 DIAGNOSIS — I10 Essential (primary) hypertension: Secondary | ICD-10-CM | POA: Diagnosis not present

## 2017-06-14 DIAGNOSIS — I25119 Atherosclerotic heart disease of native coronary artery with unspecified angina pectoris: Secondary | ICD-10-CM | POA: Diagnosis not present

## 2017-06-14 NOTE — Progress Notes (Signed)
Cardiology Office Note  Date: 06/14/2017   ID: Philip Ford, DOB [birth year], MRN [id number]  PCP: Rebecka ApleyHemberg, Katherine V, NP  Primary Cardiologist: Nona DellSamuel Jowana Thumma, MD   Chief Complaint  Patient presents with  . Coronary Artery Disease    History of Present Illness: Philip Ford is a 71 y.o. male last seen in November 2017.  He presents today overdue for follow-up.  He states that since last encounter he has had no progressive angina, only experiences this with very heavy labor.  He uses nitroglycerin occasionally.  He reports NYHA class II dyspnea with typical activities.  I reviewed his medications.  Cardiac regimen includes aspirin, Plavix, HCTZ, lisinopril, Toprol-XL, omega-3 supplements, and as needed nitroglycerin.  He has not had interval lipid assessment since last year at which point he had significant hypertriglyceridemia.  At the time of our last visit he had chosen to stop all of his lipid management medications.  States that he has been taking fish oil supplements at 2000 mg daily.  I personally reviewed his ECG today which shows normal sinus rhythm.  Past Medical History:  Diagnosis Date  . Coronary artery disease    BMS to OM1 1999, DES to mid LAD 06/2015  . Essential hypertension   . GERD (gastroesophageal reflux disease)   . History of Helicobacter pylori infection   . History of skin cancer   . Hyperlipidemia   . Statin intolerance   . Type II diabetes mellitus (HCC)   . Vitamin D deficiency     Past Surgical History:  Procedure Laterality Date  . CARDIAC CATHETERIZATION N/A 06/17/2015   Procedure: Left Heart Cath and Coronary Angiography;  Surgeon: Tonny BollmanMichael Cooper, MD;  [facility];  Service: Cardiovascular;  Laterality: N/A;  . CORONARY ANGIOPLASTY WITH STENT PLACEMENT  1999; 06/17/2015   "2 + 1"  . HERNIA REPAIR    . TONSILLECTOMY AND ADENOIDECTOMY  ~ 1954  . UMBILICAL HERNIA REPAIR  "?early 2000's"    Current Outpatient  Prescriptions  Medication Sig Dispense Refill  . Ascorbic Acid (VITAMIN C) 1000 MG tablet Take 1,000 mg by mouth daily.    Marland Kitchen. aspirin EC 81 MG tablet Take 81 mg by mouth daily.    . clopidogrel (PLAVIX) 75 MG tablet TAKE 1 TABLET EVERY DAY WITH BREAKFAST 30 tablet 6  . hydrochlorothiazide (HYDRODIURIL) 25 MG tablet Take 25 mg by mouth daily.     Marland Kitchen. lisinopril (PRINIVIL,ZESTRIL) 10 MG tablet Take 10 mg by mouth daily.     . metFORMIN (GLUCOPHAGE-XR) 500 MG 24 hr tablet Take 1 tablet (500 mg total) by mouth daily.    . metoprolol succinate (TOPROL-XL) 50 MG 24 hr tablet Take 25 mg by mouth daily. Take with or immediately following a meal.    . Multiple Vitamins-Minerals (CENTRUM SILVER PO) Take 1 tablet by mouth daily.     . nitroGLYCERIN (NITROSTAT) 0.4 MG SL tablet Place 1 tablet (0.4 mg total) under the tongue every 5 (five) minutes as needed for chest pain. 25 tablet 3  . Omega-3 Fatty Acids (FISH OIL OMEGA-3 PO) Take one tab by mouth daily - 2000mg     . omeprazole (PRILOSEC) 20 MG capsule Take 1 capsule (20 mg total) by mouth daily. 90 capsule 3   No current facility-administered medications for this visit.    Allergies:  Patient has no known allergies.   Social History: The patient  reports that he has been smoking Cigarettes.  He has a 32.50  pack-year smoking history. He has quit using smokeless tobacco. His smokeless tobacco use included Chew. He reports that he drinks alcohol. He reports that he does not use drugs.   ROS:  Please see the history of present illness. Otherwise, complete review of systems is positive for lower back and right leg pain.  All other systems are reviewed and negative.   Physical Exam: VS:  BP 126/70   Pulse 89   Ht 5\' 11"  (1.803 m)   Wt 199 lb 9.6 oz (90.5 kg)   SpO2 98%   BMI 27.84 kg/m , BMI Body mass index is 27.84 kg/m.  Wt Readings from Last 3 Encounters:  06/14/17 199 lb 9.6 oz (90.5 kg)  06/20/16 209 lb (94.8 kg)  10/08/15 209 lb 3.2 oz (94.9  kg)    General: Overweight male, appears comfortable at rest. HEENT: Conjunctiva and lids normal, oropharynx clear. Neck: Supple, no elevated JVP or carotid bruits, no thyromegaly. Lungs: Clear to auscultation, nonlabored breathing at rest. Cardiac: Regular rate and rhythm, no S3 or significant systolic murmur, no pericardial rub. Abdomen: Soft, nontender, bowel sounds present. Extremities: No pitting edema, distal pulses 2+. Skin: Warm and dry. Musculoskeletal: No kyphosis. Neuropsychiatric: Alert and oriented x3, affect grossly appropriate.  ECG: I personally reviewed the tracing from 06/20/2016 which showed normal sinus rhythm.  Recent Labwork:  September 2017: Hemoglobin A1c 7.2, hemoglobin 15.7, platelets 210, BUN 12, creatinine 1.04, potassium 3.9, AST 21, ALT 19, cholesterol 273, triglycerides 1219, HDL 23, TSH 1.43  Other Studies Reviewed Today:  Cardiac catheterization 06/17/2015:  Prox RCA lesion, 100% stenosed.  LM lesion, 30% stenosed.  1st Mrg lesion, 50% stenosed. The lesion was previously treated with a stent (unknown type)greater than two years ago.  Prox Cx lesion, 50% stenosed.  Mid LAD lesion, 95% stenosed. Post intervention, there is a 0% residual stenosis.   1. Patency of the left mainstem with minor nonobstructive stenosis 2. Severe stenosis of the mid LAD, treated successfully with PCI using a single drug-eluting stents 3. Moderate in-stent restenosis in the left circumflex involving the first obtuse marginal branch 4. 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  CAD with history of DES to the mid LAD in 2016 with known occlusion of the RCA associated with collateral filling, also moderate in-stent restenosis within the circumflex/OM system.  He has stable angina on medical therapy.  ECG is reviewed today and normal.  Continue with observation.  2.  Mixed hyperlipidemia with statin  intolerance and significant hypertriglyceridemia by history.  I have recommended that he increase his omega-3 supplements to 2000 mg twice daily.  We will obtain a fasting lipid panel and LFTs.  He will probably need to be back on a fibrate as well which she has not been taking regularly.  3.  Essential hypertension, blood pressure is adequately controlled today on current regimen.  No changes were made.  4.  Type 2 diabetes mellitus, continues on Glucophage with follow-up per PCP.  Current medicines were reviewed with the patient today.   Orders Placed This Encounter  Procedures  . Lipid Profile  . Hepatic function panel  . EKG 12-Lead    Disposition: Follow-up in 6 months.  Signed, Jonelle Sidle, MD, [facility] 06/14/2017 2:19 PM    [facility] at [facility] [address] [place], [place], [place] [address] Phone: [phone]; Fax: [phone]

## 2017-06-14 NOTE — Patient Instructions (Signed)
Medication Instructions:  Your physician recommends that you continue on your current medications as directed. Please refer to the Current Medication list given to you today.  Labwork:  LFT, LIPIDS (Fasting)  Orders given today  Testing/Procedures: NONE  Follow-Up: Your physician wants you to follow-up in: 6 MONTHS WITH DR. MCDOWELL. You will receive a reminder letter in the mail two months in advance. If you don't receive a letter, please call our office to schedule the follow-up appointment.  Any Other Special Instructions Will Be Listed Below (If Applicable).  If you need a refill on your cardiac medications before your next appointment, please call your pharmacy.

## 2017-09-22 ENCOUNTER — Telehealth: Payer: Self-pay | Admitting: Cardiology

## 2017-09-22 MED ORDER — CLOPIDOGREL BISULFATE 75 MG PO TABS: ORAL_TABLET | ORAL | 0 refills | Status: DC

## 2017-09-22 NOTE — Telephone Encounter (Signed)
Philip Ford called stating the pharmacy told him that we cancelled the medication clopidogrel (PLAVIX) 75 MG tablet

## 2017-09-22 NOTE — Telephone Encounter (Signed)
Have not cancelled prescription. [facility]

## 2017-10-24 ENCOUNTER — Other Ambulatory Visit: Payer: Self-pay | Admitting: Cardiology

## 2017-12-08 ENCOUNTER — Other Ambulatory Visit: Payer: Self-pay | Admitting: Cardiology

## 2017-12-19 ENCOUNTER — Other Ambulatory Visit: Payer: Self-pay | Admitting: Cardiology

## 2018-01-09 DIAGNOSIS — M19011 Primary osteoarthritis, right shoulder: Secondary | ICD-10-CM | POA: Insufficient documentation

## 2018-01-15 ENCOUNTER — Other Ambulatory Visit: Payer: Self-pay | Admitting: Cardiology

## 2018-02-20 NOTE — Progress Notes (Signed)
Cardiology Office Note  Date: 02/22/2018   ID: Pearla DubonnetDonald W Hauk, DOB 03/06/1946, MRN [id number]  PCP: Rebecka ApleyHemberg, Katherine V, NP  Primary Cardiologist: Nona DellSamuel Aaren Krog, MD   Chief Complaint  Patient presents with  . Coronary Artery Disease    History of Present Illness: Iran PlanasDonald W Pell is a 72 y.o. male last seen in October 2018.  He is here for a routine follow-up visit.  He does not report any active angina symptoms on medical therapy.  He reports NYHA class II dyspnea with typical activities, no palpitations or syncope.  Recent lab work is outlined below.  We discussed the results of his lipid panel.  He tells me that he has been taking omega-3 supplements at 2000 mg daily.  In the past he stopped TriCor with concerns about possible GI side effects.  I talked with him today about trying Lopid and also increasing his omega-3 supplements to twice daily.  Current cardiac regimen includes aspirin, Plavix, lisinopril, HCTZ, and Toprol-XL.  Past Medical History:  Diagnosis Date  . Coronary artery disease    BMS to OM1 1999, DES to mid LAD 06/2015  . Essential hypertension   . GERD (gastroesophageal reflux disease)   . History of Helicobacter pylori infection   . History of skin cancer   . Hyperlipidemia   . Statin intolerance   . Type II diabetes mellitus (HCC)   . Vitamin D deficiency     Past Surgical History:  Procedure Laterality Date  . CARDIAC CATHETERIZATION N/A 06/17/2015   Procedure: Left Heart Cath and Coronary Angiography;  Surgeon: Tonny BollmanMichael Cooper, MD;  [facility];  Service: Cardiovascular;  Laterality: N/A;  . CORONARY ANGIOPLASTY WITH STENT PLACEMENT  1999; 06/17/2015   "2 + 1"  . HERNIA REPAIR    . TONSILLECTOMY AND ADENOIDECTOMY  ~ 1954  . UMBILICAL HERNIA REPAIR  "?early 2000's"    Current Outpatient Medications  Medication Sig Dispense Refill  . Ascorbic Acid (VITAMIN C) 1000 MG tablet Take 1,000 mg by mouth daily.    Marland Kitchen. aspirin EC 81 MG  tablet Take 81 mg by mouth daily.    . clopidogrel (PLAVIX) 75 MG tablet Take 1 tablet (75 mg total) by mouth daily. 90 tablet 3  . hydrochlorothiazide (HYDRODIURIL) 25 MG tablet Take 25 mg by mouth daily.     Marland Kitchen. lisinopril (PRINIVIL,ZESTRIL) 10 MG tablet Take 10 mg by mouth daily.     . metFORMIN (GLUCOPHAGE-XR) 500 MG 24 hr tablet Take 1 tablet (500 mg total) by mouth daily.    . metoprolol succinate (TOPROL-XL) 50 MG 24 hr tablet Take 25 mg by mouth daily. Take with or immediately following a meal.    . Multiple Vitamins-Minerals (CENTRUM SILVER PO) Take 1 tablet by mouth daily.     . nitroGLYCERIN (NITROSTAT) 0.4 MG SL tablet PLACE 1 TABLET (0.4 MG TOTAL) UNDER THE TONGUE EVERY 5 (FIVE) MINUTES AS NEEDED FOR CHEST PAIN. 25 tablet 2  . Omega-3 Fatty Acids (FISH OIL OMEGA-3 PO) 2 (two) times daily. Take one tab by mouth daily - 2000mg      . omeprazole (PRILOSEC) 20 MG capsule Take 1 capsule (20 mg total) by mouth daily. 90 capsule 3  . gemfibrozil (LOPID) 600 MG tablet Take 1 tablet (600 mg total) by mouth 2 (two) times daily before a meal. 60 tablet 3   No current facility-administered medications for this visit.    Allergies:  Patient has no known allergies.   Social  History: The patient  reports that he has been smoking cigarettes.  He has a 32.50 pack-year smoking history. He has quit using smokeless tobacco. His smokeless tobacco use included chew. He reports that he drinks alcohol. He reports that he does not use drugs.   ROS:  Please see the history of present illness. Otherwise, complete review of systems is positive for hearing loss.  All other systems are reviewed and negative.   Physical Exam: VS:  BP (!) 147/86   Pulse 86   Ht 5\' 11"  (1.803 m)   Wt 197 lb (89.4 kg)   SpO2 96%   BMI 27.48 kg/m , BMI Body mass index is 27.48 kg/m.  Wt Readings from Last 3 Encounters:  02/22/18 197 lb (89.4 kg)  06/14/17 199 lb 9.6 oz (90.5 kg)  06/20/16 209 lb (94.8 kg)    General:  Patient appears comfortable at rest. HEENT: Conjunctiva and lids normal, oropharynx clear. Neck: Supple, no elevated JVP or carotid bruits, no thyromegaly. Lungs: Clear to auscultation, nonlabored breathing at rest. Cardiac: Regular rate and rhythm, no S3 or significant systolic murmur, no pericardial rub. Abdomen: Soft, nontender, bowel sounds present. Extremities: No pitting edema, distal pulses 2+. Skin: Warm and dry. Musculoskeletal: No kyphosis. Neuropsychiatric: Alert and oriented x3, affect grossly appropriate.  ECG: I personally reviewed the tracing from 06/14/2017 which showed normal sinus rhythm.  Recent Labwork:  September 2017: Hemoglobin A1c 7.2, hemoglobin 15.7, platelets 210, BUN 12, creatinine 1.04, potassium 3.9, AST 21, ALT 19, cholesterol 273, triglycerides 1219, HDL 23, TSH 1.43 June 2019: Vitamin D 26.19 Dec 2017: Hemoglobin A1c 7.2, hemoglobin 14.3, platelets 209, cholesterol 251, triglycerides 749, HDL 23, LDL not calculated, BUN 6, creatinine 0.96, potassium 3.6, AST 16, ALT 13  Other Studies Reviewed Today:  Cardiac catheterization 06/17/2015:  Prox RCA lesion, 100% stenosed.  LM lesion, 30% stenosed.  1st Mrg lesion, 50% stenosed. The lesion was previously treated with a stent (unknown type)greater than two years ago.  Prox Cx lesion, 50% stenosed.  Mid LAD lesion, 95% stenosed. Post intervention, there is a 0% residual stenosis.  1. Patency of the left mainstem with minor nonobstructive stenosis 2. Severe stenosis of the mid LAD, treated successfully with PCI using a single drug-eluting stents 3. Moderate in-stent restenosis in the left circumflex involving the first obtuse marginal branch 4. 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  CAD status post DES to the LAD in 2016 with occlusion of the RCA associated with collaterals, moderate in-stent restenosis within the circumflex/OM  system.  He has been managed medically and reports no active angina symptoms at this time.  2.  Hypertriglyceridemia.  Recent lipid panel reviewed.  Plan is to increase omega-3 supplements to 2000 g twice daily and start Lopid.  Follow-up lipid panel in 3 months.  3.  Essential hypertension, continue with present regimen and keep follow-up with PCP.  4.  Type 2 diabetes mellitus, on Glucophage with follow-up per PCP and recent hemoglobin A1c of 7.2.  Current medicines were reviewed with the patient today.   Orders Placed This Encounter  Procedures  . Hepatic function panel  . Lipid Profile    Disposition: Follow-up in 6 months.  Signed, Jonelle Sidle, MD, [facility] 02/22/2018 11:54 AM    [facility] at [facility] [address] [place], [place], [place] [address] Phone: [phone]; Fax: [phone]

## 2018-02-21 ENCOUNTER — Encounter: Payer: Self-pay | Admitting: *Deleted

## 2018-02-22 ENCOUNTER — Ambulatory Visit: Payer: Medicare Other | Admitting: Cardiology

## 2018-02-22 ENCOUNTER — Encounter: Payer: Self-pay | Admitting: Cardiology

## 2018-02-22 VITALS — BP 147/86 | HR 86 | Ht 71.0 in | Wt 197.0 lb

## 2018-02-22 DIAGNOSIS — I10 Essential (primary) hypertension: Secondary | ICD-10-CM

## 2018-02-22 DIAGNOSIS — I25119 Atherosclerotic heart disease of native coronary artery with unspecified angina pectoris: Secondary | ICD-10-CM | POA: Diagnosis not present

## 2018-02-22 DIAGNOSIS — E782 Mixed hyperlipidemia: Secondary | ICD-10-CM

## 2018-02-22 DIAGNOSIS — E118 Type 2 diabetes mellitus with unspecified complications: Secondary | ICD-10-CM

## 2018-02-22 MED ORDER — CLOPIDOGREL BISULFATE 75 MG PO TABS: 75.0000 mg | ORAL_TABLET | Freq: Every day | ORAL | 3 refills | Status: DC

## 2018-02-22 MED ORDER — GEMFIBROZIL 600 MG PO TABS: 600.0000 mg | ORAL_TABLET | Freq: Two times a day (BID) | ORAL | 3 refills | Status: DC

## 2018-02-22 NOTE — Patient Instructions (Signed)
Medication Instructions:  Your physician has recommended you make the following change in your medication:    START Fish oil TWICE DAILY   START Lopid 600 mg twice daily   Please continue all other medications as prescribed   Labwork: LIVER/LIPIDS Orders given today   Testing/Procedures: NONE  Follow-Up: Your physician wants you to follow-up in: 6 MONTHS WITH DR. MCDOWELL. You will receive a reminder letter in the mail two months in advance. If you don't receive a letter, please call our office to schedule the follow-up appointment.  Any Other Special Instructions Will Be Listed Below (If Applicable).  If you need a refill on your cardiac medications before your next appointment, please call your pharmacy.

## 2018-06-28 ENCOUNTER — Telehealth: Payer: Self-pay | Admitting: Cardiology

## 2018-06-28 NOTE — Telephone Encounter (Signed)
Patient c/o sob with activity that has gotten worse. Also c/o that if he gets excited, he has intermittent chest pain rated 5-6/10 that started since last appt. Patient said he takes one nitroglycerin and the pain is relieved. No c/o dizziness. Patient was offered an appointment to come in tomorrow to be seen by Dr. Diona BrownerMcDowell and says he can't come tomorrow. Patient has been scheduled for an appointment on 08/02/18 and advised if his symptoms get worse, to go to the ED for an evaluation. Patient asked if he could get a sooner appointment. Once again, patient offered appointment for 06/29/18 and he declined. Patient stated "Do I need to find me a new doctor?" Patient advised that he can come to the visit tomorrow that has been offered to him. Patient hung the phone up.

## 2018-06-28 NOTE — Telephone Encounter (Signed)
Patient called not having active chest pain.  Has been experiencing chest pain off and on for past 3-4 weeks.     Experiences pain when he lifts things and does work   I offer appt with Diona BrownerMcDowell 06/29/18 but patient declined - said he could not come.   I then made him an appointment for next available in Dec 2019  Patient would like to be advised what he should do if chest pain returns

## 2018-08-02 ENCOUNTER — Encounter: Payer: Self-pay | Admitting: *Deleted

## 2018-08-02 ENCOUNTER — Encounter: Payer: Self-pay | Admitting: Cardiology

## 2018-08-02 ENCOUNTER — Telehealth: Payer: Self-pay | Admitting: Cardiology

## 2018-08-02 ENCOUNTER — Ambulatory Visit: Payer: Medicare Other | Admitting: Cardiology

## 2018-08-02 VITALS — BP 158/78 | HR 86 | Ht 71.5 in | Wt 194.2 lb

## 2018-08-02 DIAGNOSIS — I25119 Atherosclerotic heart disease of native coronary artery with unspecified angina pectoris: Secondary | ICD-10-CM

## 2018-08-02 DIAGNOSIS — E782 Mixed hyperlipidemia: Secondary | ICD-10-CM | POA: Diagnosis not present

## 2018-08-02 DIAGNOSIS — E118 Type 2 diabetes mellitus with unspecified complications: Secondary | ICD-10-CM | POA: Diagnosis not present

## 2018-08-02 DIAGNOSIS — I10 Essential (primary) hypertension: Secondary | ICD-10-CM | POA: Diagnosis not present

## 2018-08-02 MED ORDER — ISOSORBIDE MONONITRATE ER 30 MG PO TB24: 30.0000 mg | ORAL_TABLET | Freq: Every day | ORAL | 3 refills | Status: DC

## 2018-08-02 NOTE — Telephone Encounter (Signed)
Pre-cert Verification for the following procedure   Lexiscan myoview scheduled [facility]

## 2018-08-02 NOTE — Progress Notes (Signed)
Cardiology Office Note  Date: 08/02/2018   ID: Philip Ford 1945-12-03, MRN [id number]  PCP: Philip Apley, NP  Primary Cardiologist: Philip Dell, MD   Chief Complaint  Patient presents with  . Coronary Artery Disease    History of Present Illness: Philip Ford is a 72 y.o. male last seen in July.  He presents for a routine follow-up visit. I reviewed the chart, I see that he called the office back on November 14 reporting worsening shortness of breath and intermittent chest pain.  We offered him a visit on the following day to be seen, however he indicated that he could not make that visit.  We discussed his symptoms.  He states that over the last few months he has been experiencing intermittent angina symptoms with use of nitroglycerin, chest pain resolves with rest or one nitroglycerin.  He is also having some reflux and indigestion symptoms after meals and when supine.  Current cardiac regimen includes aspirin, Plavix, Lopid, HCTZ, lisinopril, Toprol-XL, and as needed nitroglycerin.  He is also on a PPI for reflux.  I personally reviewed his ECG today which shows normal sinus rhythm.  Past Medical History:  Diagnosis Date  . Coronary artery disease    BMS to OM1 1999, DES to mid LAD 06/2015  . Essential hypertension   . GERD (gastroesophageal reflux disease)   . History of Helicobacter pylori infection   . History of skin cancer   . Hyperlipidemia   . Statin intolerance   . Type II diabetes mellitus (HCC)   . Vitamin D deficiency     Past Surgical History:  Procedure Laterality Date  . CARDIAC CATHETERIZATION N/A 06/17/2015   Procedure: Left Heart Cath and Coronary Angiography;  Surgeon: Tonny Bollman, MD;  Location: [facility] INVASIVE CV LAB;  Service: Cardiovascular;  Laterality: N/A;  . CORONARY ANGIOPLASTY WITH STENT PLACEMENT  1999; 06/17/2015   "2 + 1"  . HERNIA REPAIR    . TONSILLECTOMY AND ADENOIDECTOMY  ~ 1954  . UMBILICAL HERNIA REPAIR  "?early  2000's"    Current Outpatient Medications  Medication Sig Dispense Refill  . Ascorbic Acid (VITAMIN C) 1000 MG tablet Take 1,000 mg by mouth daily.    Marland Kitchen aspirin EC 81 MG tablet Take 81 mg by mouth daily.    . clopidogrel (PLAVIX) 75 MG tablet Take 1 tablet (75 mg total) by mouth daily. 90 tablet 3  . gemfibrozil (LOPID) 600 MG tablet Take 1 tablet (600 mg total) by mouth 2 (two) times daily before a meal. 60 tablet 3  . hydrochlorothiazide (HYDRODIURIL) 25 MG tablet Take 25 mg by mouth daily.     Marland Kitchen lisinopril (PRINIVIL,ZESTRIL) 10 MG tablet Take 10 mg by mouth daily.     . metFORMIN (GLUCOPHAGE-XR) 500 MG 24 hr tablet Take 1 tablet (500 mg total) by mouth daily.    . metoprolol succinate (TOPROL-XL) 50 MG 24 hr tablet Take 25 mg by mouth daily. Take with or immediately following a meal.    . Multiple Vitamins-Minerals (CENTRUM SILVER PO) Take 1 tablet by mouth daily.     . nitroGLYCERIN (NITROSTAT) 0.4 MG SL tablet PLACE 1 TABLET (0.4 MG TOTAL) UNDER THE TONGUE EVERY 5 (FIVE) MINUTES AS NEEDED FOR CHEST PAIN. 25 tablet 2  . Omega-3 Fatty Acids (FISH OIL OMEGA-3 PO) 2 (two) times daily. Take one tab by mouth daily - 2000mg      . omeprazole (PRILOSEC) 20 MG capsule Take 1 capsule (20 mg  total) by mouth daily. 90 capsule 3  . testosterone cypionate (DEPOTESTOSTERONE CYPIONATE) 200 MG/ML injection Inject into the muscle every 14 (fourteen) days.     . isosorbide mononitrate (IMDUR) 30 MG 24 hr tablet Take 1 tablet (30 mg total) by mouth daily. 90 tablet 3   No current facility-administered medications for this visit.    Allergies:  Patient has no known allergies.   Social History: The patient  reports that he has been smoking cigarettes. He has a 32.50 pack-year smoking history. He has quit using smokeless tobacco.  His smokeless tobacco use included chew. He reports current alcohol use. He reports that he does not use drugs.   ROS:  Please see the history of present illness. Otherwise,  complete review of systems is positive for none.  All other systems are reviewed and negative.   Physical Exam: VS:  BP (!) 158/78   Pulse 86   Ht 5' 11.5" (1.816 m)   Wt 194 lb 3.2 oz (88.1 kg)   SpO2 97%   BMI 26.71 kg/m , BMI Body mass index is 26.71 kg/m.  Wt Readings from Last 3 Encounters:  08/02/18 194 lb 3.2 oz (88.1 kg)  02/22/18 197 lb (89.4 kg)  06/14/17 199 lb 9.6 oz (90.5 kg)    General: Patient appears comfortable at rest. HEENT: Conjunctiva and lids normal, oropharynx clear. Neck: Supple, no elevated JVP or carotid bruits, no thyromegaly. Lungs: Clear to auscultation, nonlabored breathing at rest. Cardiac: Regular rate and rhythm, no S3 or significant systolic murmur. Abdomen: Soft, nontender, bowel sounds present. Extremities: No pitting edema, distal pulses 2+. Skin: Warm and dry. Musculoskeletal: No kyphosis. Neuropsychiatric: Alert and oriented x3, affect grossly appropriate.  ECG: I personally reviewed the tracing from 06/14/2017 which showed normal sinus rhythm.  Recent Labwork:  May 2019: Hemoglobin A1c 7.2, hemoglobin 14.3, platelets 209, cholesterol 251, triglycerides 749, HDL 23, LDL not calculated, BUN 6, creatinine 0.96, potassium 3.6, AST 16, ALT 13  Other Studies Reviewed Today:  Cardiac catheterization 06/17/2015:  Prox RCA lesion, 100% stenosed.  LM lesion, 30% stenosed.  1st Mrg lesion, 50% stenosed. The lesion was previously treated with a stent (unknown type)greater than two years ago.  Prox Cx lesion, 50% stenosed.  Mid LAD lesion, 95% stenosed. Post intervention, there is a 0% residual stenosis.  1. Patency of the left mainstem with minor nonobstructive stenosis 2. Severe stenosis of the mid LAD, treated successfully with PCI using a single drug-eluting stents 3. Moderate in-stent restenosis in the left circumflex involving the first obtuse marginal branch 4. Chronic total occlusion of the proximal RCA with right to right  collaterals noted  Assessment and Plan:  1.  CAD status post DES to the LAD in 2016 with occlusion of the RCA associated with collaterals and moderate in-stent restenosis of the circumflex/OM system.  He is reporting increasing angina symptoms and dyspnea exertion.  Plan is to continue medical therapy with addition of Imdur 30 mg daily and proceed with a follow-up Lexiscan Myoview to reevaluate ischemic burden.  2.  Mixed hyperlipidemia, on omega-3 supplements and Lopid.  3.  Essential hypertension, continue medical regimen and follow-up with PCP.  4.  Type 2 diabetes mellitus, on Glucophage.  Last hemoglobin A1c 7.9.  Current medicines were reviewed with the patient today.   Orders Placed This Encounter  Procedures  . NM Myocar Multi W/Spect W/Wall Motion / EF  . EKG 12-Lead    Disposition: Review test results and determine follow-up plan.  Signed, Jonelle SidleSamuel G. Princeston Blizzard, MD, [facility] 08/02/2018 1:44 PM    [facility] [place] [address], [place], [place] [address] Phone: [phone]; Fax: [phone]

## 2018-08-02 NOTE — Patient Instructions (Addendum)
Medication Instructions:   Your physician has recommended you make the following change in your medication:   Start imdur (isosorbide mononitrate) 30 mg by mouth daily  Continue all other medications the same.  Labwork:  NONE  Testing/Procedures: Your physician has requested that you have a lexiscan myoview. For further information please visit [email or web address]. Please follow instruction sheet, as given.  Follow-Up:  Your physician recommends that you schedule a follow-up appointment in: pending test result.  Any Other Special Instructions Will Be Listed Below (If Applicable).  If you need a refill on your cardiac medications before your next appointment, please call your pharmacy.

## 2018-08-07 ENCOUNTER — Telehealth: Payer: Self-pay | Admitting: Cardiology

## 2018-08-07 NOTE — Telephone Encounter (Signed)
Was questioning what type of stress test he was having.  Explained to him that he was having a Lexiscan - given the drug so he will not have to walk on the treadmill.  He verbalized understanding.

## 2018-08-07 NOTE — Telephone Encounter (Signed)
Patient called wanting to speak with someone in regards to his upcoming stress test.

## 2018-08-20 ENCOUNTER — Encounter (HOSPITAL_BASED_OUTPATIENT_CLINIC_OR_DEPARTMENT_OTHER)
Admission: RE | Admit: 2018-08-20 | Discharge: 2018-08-20 | Disposition: A | Discharge status: Home / Self Care | Payer: Medicare Other | Source: Ambulatory Visit | Attending: Cardiology | Admitting: Cardiology

## 2018-08-20 ENCOUNTER — Encounter (HOSPITAL_COMMUNITY)
Admission: RE | Admit: 2018-08-20 | Discharge: 2018-08-20 | Disposition: A | Discharge status: Home / Self Care | Payer: Medicare Other | Source: Ambulatory Visit | Attending: Cardiology | Admitting: Cardiology

## 2018-08-20 ENCOUNTER — Encounter (HOSPITAL_COMMUNITY): Payer: Self-pay

## 2018-08-20 DIAGNOSIS — I25119 Atherosclerotic heart disease of native coronary artery with unspecified angina pectoris: Secondary | ICD-10-CM | POA: Insufficient documentation

## 2018-08-20 LAB — NM MYOCAR MULTI W/SPECT W/WALL MOTION / EF
LV dias vol: 101 mL (ref 62–150)
LV sys vol: 54 mL
Peak HR: 94 {beats}/min
RATE: 0.8
Rest HR: 82 {beats}/min
SDS: 6
SRS: 5
SSS: 11
TID: 1.15

## 2018-08-20 MED ORDER — TECHNETIUM TC 99M TETROFOSMIN IV KIT
30.0000 | PACK | Freq: Once | INTRAVENOUS | Status: AC | PRN
  Administered 2018-08-20: 30 via INTRAVENOUS

## 2018-08-20 MED ORDER — TECHNETIUM TC 99M TETROFOSMIN IV KIT
10.0000 | PACK | Freq: Once | INTRAVENOUS | Status: AC | PRN
  Administered 2018-08-20: 10.2 via INTRAVENOUS

## 2018-08-20 MED ORDER — REGADENOSON 0.4 MG/5ML IV SOLN
INTRAVENOUS | Status: AC
  Administered 2018-08-20: 0.4 mg via INTRAVENOUS
  Filled 2018-08-20: qty 5

## 2018-08-20 MED ORDER — SODIUM CHLORIDE 0.9% FLUSH
INTRAVENOUS | Status: AC
  Administered 2018-08-20: 10 mL via INTRAVENOUS
  Filled 2018-08-20: qty 10

## 2018-08-21 ENCOUNTER — Telehealth: Payer: Self-pay | Admitting: *Deleted

## 2018-08-21 NOTE — Telephone Encounter (Signed)
Patient returned call

## 2018-08-21 NOTE — Telephone Encounter (Signed)
Patient informed and verbalized understanding of plan. Copy sent to PCP 

## 2018-08-21 NOTE — Telephone Encounter (Signed)
-----   Message from Jonelle Sidle, MD sent at 08/20/2018  4:55 PM EST ----- Results reviewed.  Stress test does show combination of scar and mild to moderate peri-infarct ischemia in the RCA distribution.  We did add Imdur at the recent visit, hopefully this is helping with angina symptoms.  Please make sure that he has a follow-up visit within the next 4 to 6 weeks. A copy of this test should be forwarded to [facility], Ruby Cola, NP.

## 2018-09-19 ENCOUNTER — Other Ambulatory Visit: Payer: Self-pay | Admitting: Cardiology

## 2018-09-22 ENCOUNTER — Other Ambulatory Visit: Payer: Self-pay | Admitting: Cardiology

## 2018-10-05 ENCOUNTER — Encounter: Payer: Self-pay | Admitting: Cardiology

## 2018-10-05 ENCOUNTER — Ambulatory Visit: Payer: Medicare Other | Admitting: Cardiology

## 2018-10-05 VITALS — BP 122/70 | HR 78 | Ht 71.5 in | Wt 201.0 lb

## 2018-10-05 DIAGNOSIS — I25119 Atherosclerotic heart disease of native coronary artery with unspecified angina pectoris: Secondary | ICD-10-CM | POA: Diagnosis not present

## 2018-10-05 DIAGNOSIS — M79604 Pain in right leg: Secondary | ICD-10-CM | POA: Diagnosis not present

## 2018-10-05 DIAGNOSIS — I10 Essential (primary) hypertension: Secondary | ICD-10-CM

## 2018-10-05 DIAGNOSIS — E782 Mixed hyperlipidemia: Secondary | ICD-10-CM | POA: Diagnosis not present

## 2018-10-05 DIAGNOSIS — M79605 Pain in left leg: Secondary | ICD-10-CM

## 2018-10-05 NOTE — Patient Instructions (Addendum)
Medication Instructions:   Your physician recommends that you continue on your current medications as directed. Please refer to the Current Medication list given to you today.  Labwork:  NONE  Testing/Procedures:  Your physician has requested that you have a lower extremity arterial exercise duplex. During this test, exercise and ultrasound are used to evaluate arterial blood flow in the legs. Allow one hour for this exam. There are no restrictions or special instructions.  Follow-Up:  Your physician recommends that you schedule a follow-up appointment in: 6 months. You will receive a reminder letter in the mail in about 4 months reminding you to call and schedule your appointment. If you don't receive this letter, please contact our office.  Any Other Special Instructions Will Be Listed Below (If Applicable).  If you need a refill on your cardiac medications before your next appointment, please call your pharmacy.

## 2018-10-05 NOTE — Progress Notes (Signed)
Cardiology Office Note  Date: 10/05/2018   ID: Ordell, Zerr July 22, 1946, MRN [id number]  PCP: Rebecka Apley, NP  Primary Cardiologist: Nona Dell, MD   Chief Complaint  Patient presents with  . Coronary Artery Disease    History of Present Illness: ALEXANDRIA SANOCKI is a 73 y.o. male last seen in December 2019.  He presents for a follow-up visit.  He tells me that he feels better in terms of angina control since last encounter.  He also has more stamina.  Imdur was added to his regimen at last visit.  Follow-up [facility] showed large region of inferior scar with mild to moderate peri-infarct ischemia and mildly reduced LVEF.  We discussed the results today and our plan will be to continue medical therapy for now as long as symptoms are adequately controlled.  He tells me that he has also had bilateral leg pain with ambulation concerning for claudication.  This does not occur when he simply stands up for a period of time.  He is also worried about his lower back.  No injuries.  Past Medical History:  Diagnosis Date  . Coronary artery disease    BMS to OM1 1999, DES to mid LAD 06/2015  . Essential hypertension   . GERD (gastroesophageal reflux disease)   . History of Helicobacter pylori infection   . History of skin cancer   . Hyperlipidemia   . Statin intolerance   . Type II diabetes mellitus (HCC)   . Vitamin D deficiency     Past Surgical History:  Procedure Laterality Date  . CARDIAC CATHETERIZATION N/A 06/17/2015   Procedure: Left Heart Cath and Coronary Angiography;  Surgeon: Tonny Bollman, MD;  Location: [facility] INVASIVE CV LAB;  Service: Cardiovascular;  Laterality: N/A;  . CORONARY ANGIOPLASTY WITH STENT PLACEMENT  1999; 06/17/2015   "2 + 1"  . HERNIA REPAIR    . TONSILLECTOMY AND ADENOIDECTOMY  ~ 1954  . UMBILICAL HERNIA REPAIR  "?early 2000's"    Current Outpatient Medications  Medication Sig Dispense Refill  . Ascorbic Acid (VITAMIN C) 1000  MG tablet Take 1,000 mg by mouth daily.    Marland Kitchen aspirin EC 81 MG tablet Take 81 mg by mouth daily.    . clopidogrel (PLAVIX) 75 MG tablet Take 1 tablet (75 mg total) by mouth daily. 90 tablet 3  . gemfibrozil (LOPID) 600 MG tablet TAKE 1 TABLET BY MOUTH 2  TIMES DAILY BEFORE MEALS 60 tablet 3  . hydrochlorothiazide (HYDRODIURIL) 25 MG tablet Take 25 mg by mouth daily.     . isosorbide mononitrate (IMDUR) 30 MG 24 hr tablet Take 1 tablet (30 mg total) by mouth daily. 90 tablet 3  . lisinopril (PRINIVIL,ZESTRIL) 10 MG tablet Take 10 mg by mouth daily.     . metFORMIN (GLUCOPHAGE-XR) 500 MG 24 hr tablet Take 1 tablet (500 mg total) by mouth daily.    . metoprolol succinate (TOPROL-XL) 50 MG 24 hr tablet Take 25 mg by mouth daily. Take with or immediately following a meal.    . Multiple Vitamins-Minerals (CENTRUM SILVER PO) Take 1 tablet by mouth daily.     . nitroGLYCERIN (NITROSTAT) 0.4 MG SL tablet PLACE 1 TABLET (0.4 MG TOTAL) UNDER THE TONGUE EVERY 5 (FIVE) MINUTES AS NEEDED FOR CHEST PAIN. 25 tablet 2  . Omega-3 Fatty Acids (FISH OIL OMEGA-3 PO) 2 (two) times daily. Take one tab by mouth daily - 2000mg      . omeprazole (PRILOSEC) 20  MG capsule Take 1 capsule (20 mg total) by mouth daily. 90 capsule 3  . testosterone cypionate (DEPOTESTOSTERONE CYPIONATE) 200 MG/ML injection Inject into the muscle every 14 (fourteen) days.      No current facility-administered medications for this visit.    Allergies:  Patient has no known allergies.   Social History: The patient  reports that he has been smoking cigarettes. He has a 32.50 pack-year smoking history. He has quit using smokeless tobacco.  His smokeless tobacco use included chew. He reports current alcohol use. He reports that he does not use drugs.   ROS:  Please see the history of present illness. Otherwise, complete review of systems is positive for hearing loss.  All other systems are reviewed and negative.   Physical Exam: VS:  BP 122/70    Pulse 78   Ht 5' 11.5" (1.816 m)   Wt 201 lb (91.2 kg)   SpO2 97% Comment: on room air  BMI 27.64 kg/m , BMI Body mass index is 27.64 kg/m.  Wt Readings from Last 3 Encounters:  10/05/18 201 lb (91.2 kg)  08/02/18 194 lb 3.2 oz (88.1 kg)  02/22/18 197 lb (89.4 kg)    General: Patient appears comfortable at rest. HEENT: Conjunctiva and lids normal, oropharynx clear. Neck: Supple, no elevated JVP or carotid bruits, no thyromegaly. Lungs: Clear to auscultation, nonlabored breathing at rest. Cardiac: Regular rate and rhythm, no S3 or significant systolic murmur, no pericardial rub. Abdomen: Soft, nontender, no hepatomegaly, bowel sounds present, no guarding or rebound. Extremities: No pitting edema. Skin: Warm and dry. Musculoskeletal: No kyphosis. Neuropsychiatric: Alert and oriented x3, affect grossly appropriate.  ECG: I personally reviewed the tracing from 08/02/2018 which showed sinus rhythm.  Recent Labwork:  May 2019: Hemoglobin A1c 7.2, hemoglobin 14.3, platelets 209, cholesterol 251, triglycerides 749, HDL 23, LDL not calculated, BUN 6, creatinine 0.96, potassium 3.6, AST 16, ALT 13  Other Studies Reviewed Today:  Cardiac catheterization 06/17/2015:  Prox RCA lesion, 100% stenosed.  LM lesion, 30% stenosed.  1st Mrg lesion, 50% stenosed. The lesion was previously treated with a stent (unknown type)greater than two years ago.  Prox Cx lesion, 50% stenosed.  Mid LAD lesion, 95% stenosed. Post intervention, there is a 0% residual stenosis.  1. Patency of the left mainstem with minor nonobstructive stenosis 2. Severe stenosis of the mid LAD, treated successfully with PCI using a single drug-eluting stents 3. Moderate in-stent restenosis in the left circumflex involving the first obtuse marginal branch 4. Chronic total occlusion of the proximal RCA with right to right collaterals noted  Lexiscan Myoview 08/20/2018:  There was no ST segment deviation noted during  stress.  Findings consistent with prior large inferior myocardial infarction with mild to moderate peri-infarct ischemia. There is significant adjacent gut radiotracer uptake that may affect findings,  The left ventricular ejection fraction is mildly decreased (45-54%).  This is an intermediate risk study. Risk based on mildly decreased LVEF and mild to moderate peri-infarct ischemia.  Assessment and Plan:  1.  CAD status post DES to the LAD in 2015 with occlusion of the RCA associated with collaterals and moderate in-stent restenosis involving the circumflex/OM system.  He reports reasonable angina control at this time since the addition of Imdur.  Follow-up Lexiscan Myoview is consistent with RCA distribution scar and mild to moderate peri-infarct ischemia.  Plan to continue medical therapy as long as symptoms are managed.  2.  Bilateral leg pain with ambulation concerning for claudication.  Lower extremity  arterial Dopplers and ABIs will be obtained.  3.  Mixed hyperlipidemia, he is on Lopid and omega-3 supplements.  Reports statin intolerance.  4.  Essential hypertension, blood pressure is adequately controlled today.  Current medicines were reviewed with the patient today.  Disposition: Follow-up in 6 months, sooner if needed.  Signed, Jonelle Sidle, MD, [facility] 10/05/2018 1:12 PM    [facility] at [facility] [address] [place], [place], [place] [address] Phone: [phone]; Fax: [phone]

## 2018-10-10 ENCOUNTER — Other Ambulatory Visit: Payer: Self-pay | Admitting: Cardiology

## 2018-10-10 DIAGNOSIS — M79605 Pain in left leg: Principal | ICD-10-CM

## 2018-10-10 DIAGNOSIS — M79604 Pain in right leg: Secondary | ICD-10-CM

## 2018-10-25 ENCOUNTER — Telehealth: Payer: Self-pay | Admitting: *Deleted

## 2018-10-25 ENCOUNTER — Other Ambulatory Visit: Payer: Self-pay

## 2018-10-25 ENCOUNTER — Ambulatory Visit (INDEPENDENT_AMBULATORY_CARE_PROVIDER_SITE_OTHER): Payer: Medicare Other

## 2018-10-25 DIAGNOSIS — M79605 Pain in left leg: Principal | ICD-10-CM

## 2018-10-25 DIAGNOSIS — I739 Peripheral vascular disease, unspecified: Secondary | ICD-10-CM

## 2018-10-25 DIAGNOSIS — M79604 Pain in right leg: Secondary | ICD-10-CM

## 2018-10-25 NOTE — Telephone Encounter (Signed)
-----   Message from Jonelle Sidle, MD sent at 10/25/2018  1:49 PM EDT ----- Results reviewed.  Arterial calcification limits utility of the study.  Please refer to the following report. A copy of this test should be forwarded to [facility], Ruby Cola, NP.

## 2018-10-25 NOTE — Telephone Encounter (Signed)
-----   Message from Jonelle Sidle, MD sent at 10/25/2018  1:49 PM EDT ----- Results reviewed.  Study is consistent with PAD, some of which looks to be flow-limiting and symptom provoking.  Please refer him for vascular consultation with Dr. Allyson Sabal or Dr. Kirke Corin. A copy of this test should be forwarded to [facility], Ruby Cola, NP.

## 2018-10-26 NOTE — Telephone Encounter (Signed)
Patient informed and verbalized understanding of plan. Copy sent to PCP 

## 2018-10-26 NOTE — Telephone Encounter (Signed)
Patient returning call Will be home a little while this morning

## 2018-10-30 ENCOUNTER — Telehealth: Payer: Self-pay | Admitting: Cardiology

## 2018-10-30 NOTE — Telephone Encounter (Signed)
FYI: patient will call back to schedule his appointment with Dr. Allyson Sabal when this virus is over

## 2018-12-25 ENCOUNTER — Other Ambulatory Visit: Payer: Self-pay | Admitting: Cardiology

## 2018-12-25 MED ORDER — CLOPIDOGREL BISULFATE 75 MG PO TABS: 75.0000 mg | ORAL_TABLET | Freq: Every day | ORAL | 3 refills | Status: DC

## 2018-12-25 NOTE — Telephone Encounter (Signed)
Refill complete 

## 2018-12-25 NOTE — Telephone Encounter (Signed)
°*  STAT* If patient is at the pharmacy, call can be transferred to refill team.   1. Which medications need to be refilled? (please list name of each medication and dose if known)   clopidogrel (PLAVIX) 75 MG tablet [[id number]]   2. Which pharmacy/location (including street and city if local pharmacy) is medication to be sent to? [facility]  3. Do they need a 30 day or 90 day supply?  90 day

## 2019-01-15 ENCOUNTER — Telehealth: Payer: Self-pay | Admitting: Cardiovascular Disease

## 2019-01-15 NOTE — Telephone Encounter (Signed)
Left message for patient to call and schedule in office visit with Dr. Allyson Sabal on 01/18/19.

## 2019-01-31 ENCOUNTER — Telehealth: Payer: Self-pay | Admitting: Cardiology

## 2019-01-31 NOTE — Telephone Encounter (Signed)
Patient called stating that he has not heard from recent test he had.

## 2019-01-31 NOTE — Telephone Encounter (Signed)
Reminded patient that results were called to him in March.  SEE BELOW -   ----------------------------------------------------------------------------------------------------  Notes recorded by Merlene Laughter, RN on 10/26/2018 at 9:06 AM EDT  Patient informed and verbalized understanding of plan. Copy sent to PCP   ------   Notes recorded by Merlene Laughter, RN on 10/25/2018 at 4:27 PM EDT  Left message for patient to call office.  ------   Notes recorded by Satira Sark, MD on 10/25/2018 at 1:49 PM EDT  Results reviewed. Study is consistent with PAD, some of which looks to be flow-limiting and symptom provoking. Please refer him for vascular consultation with Dr. Gwenlyn Found or Dr. Fletcher Anon.  A copy of this test should be forwarded to [facility], Karie Schwalbe, NP.

## 2019-01-31 NOTE — Telephone Encounter (Signed)
Noted on his vascular referral that he will call back when ready to schedule due to the covid19 virus.  He will do this when he is ready to move forward.

## 2019-03-26 ENCOUNTER — Other Ambulatory Visit: Payer: Self-pay | Admitting: Cardiology

## 2019-03-26 MED ORDER — GEMFIBROZIL 600 MG PO TABS: 600.0000 mg | ORAL_TABLET | Freq: Two times a day (BID) | ORAL | 3 refills | Status: DC

## 2019-03-26 MED ORDER — CLOPIDOGREL BISULFATE 75 MG PO TABS: 75.0000 mg | ORAL_TABLET | Freq: Every day | ORAL | 3 refills | Status: DC

## 2019-03-26 NOTE — Telephone Encounter (Signed)
Patient made aware - states he will call back to schedule the office visit.  Prescriptions sent to mail order as requested.

## 2019-03-26 NOTE — Telephone Encounter (Signed)
°  1. Which medications need to be refilled? (please list name of each medication and dose if known)   1. PLAVIX 75 MG  2. LOPID 600 MG   2. Which pharmacy/location (including street and city if local pharmacy) is medication to be sent to? [facility]   3. Do they need a 30 day or 90 day supply? 90   Patient is requesting a 90 day supply

## 2019-03-26 NOTE — Telephone Encounter (Signed)
Due for 6 mo f/u this month.  Left message to return call.

## 2019-05-24 ENCOUNTER — Ambulatory Visit: Payer: Medicare Other | Admitting: Cardiology

## 2019-05-24 ENCOUNTER — Encounter: Payer: Self-pay | Admitting: Cardiology

## 2019-05-24 ENCOUNTER — Other Ambulatory Visit: Payer: Self-pay

## 2019-05-24 VITALS — BP 124/68 | HR 72 | Ht 71.5 in | Wt 191.8 lb

## 2019-05-24 DIAGNOSIS — E782 Mixed hyperlipidemia: Secondary | ICD-10-CM | POA: Diagnosis not present

## 2019-05-24 DIAGNOSIS — I25119 Atherosclerotic heart disease of native coronary artery with unspecified angina pectoris: Secondary | ICD-10-CM

## 2019-05-24 DIAGNOSIS — I739 Peripheral vascular disease, unspecified: Secondary | ICD-10-CM | POA: Diagnosis not present

## 2019-05-24 DIAGNOSIS — I10 Essential (primary) hypertension: Secondary | ICD-10-CM

## 2019-05-24 MED ORDER — EZETIMIBE 10 MG PO TABS: 10.0000 mg | ORAL_TABLET | Freq: Every day | ORAL | 3 refills | Status: DC

## 2019-05-24 NOTE — Progress Notes (Signed)
Cardiology Office Note  Date: 05/24/2019   ID: Crosby, Oriordan Jul 16, 1946, MRN [id number]  PCP:  Bridget Hartshorn, NP  Cardiologist:  Rozann Lesches, MD Electrophysiologist:  None   Chief Complaint  Patient presents with  . Cardiac follow-up    History of Present Illness: Philip Ford is a 73 y.o. male last seen in February.  He presents for a follow-up visit.  Since last encounter he does not describe any progressive angina symptoms on medical therapy.  He tells me that he remains comfortable with current treatment, does not feel like pursuing a cardiac catheterization is necessary at this point based on current symptom control.  I reviewed his cardiac medications which are outlined below.  He did run out of several medications and transition of his PCP office, but states that he has been back on everything regularly at this point.  I did refer him for lower extremity arterial Dopplers earlier in the year, abnormal as described below.  We made a referral for vascular consultation, but he held off stating that his leg pain has not been bothering him recently.  I told him that we could still have him assessed if he would like to proceed eventually.  We went over his lab work from September.  He still has high triglycerides although they are trending down back on medications, his LDL was 118.  He has statin intolerance.  I talked with him about further options and for now he has agreed to a trial of Zetia.  He did not want to consider injectable agents such as PCSK9 inhibitors at this point.  Past Medical History:  Diagnosis Date  . Coronary artery disease    BMS to [place], DES to mid LAD 06/2015  . Essential hypertension   . GERD (gastroesophageal reflux disease)   . History of Helicobacter pylori infection   . History of skin cancer   . Hyperlipidemia   . Statin intolerance   . Type II diabetes mellitus ([place])   . Vitamin D deficiency     Past Surgical History:   Procedure Laterality Date  . CARDIAC CATHETERIZATION N/A 06/17/2015   Procedure: Left Heart Cath and Coronary Angiography;  Surgeon: Sherren Mocha, MD;  Location: [facility];  Service: Cardiovascular;  Laterality: N/A;  . [place]; 06/17/2015   "2 + 1"  . HERNIA REPAIR    . TONSILLECTOMY AND ADENOIDECTOMY  ~ 1954  . UMBILICAL HERNIA REPAIR  "?early 2000's"    Current Outpatient Medications  Medication Sig Dispense Refill  . Ascorbic Acid (VITAMIN C) 1000 MG tablet Take 1,000 mg by mouth daily.    Marland Kitchen aspirin EC 81 MG tablet Take 81 mg by mouth daily.    . clopidogrel (PLAVIX) 75 MG tablet Take 1 tablet (75 mg total) by mouth daily. 90 tablet 3  . gemfibrozil (LOPID) 600 MG tablet Take 1 tablet (600 mg total) by mouth 2 (two) times daily before a meal. 180 tablet 3  . hydrochlorothiazide (HYDRODIURIL) 25 MG tablet Take 25 mg by mouth daily.     . isosorbide mononitrate (IMDUR) 30 MG 24 hr tablet Take 1 tablet (30 mg total) by mouth daily. 90 tablet 3  . lisinopril (PRINIVIL,ZESTRIL) 10 MG tablet Take 10 mg by mouth daily.     . metFORMIN (GLUCOPHAGE-XR) 500 MG 24 hr tablet Take 1 tablet (500 mg total) by mouth daily.    . metoprolol succinate (TOPROL-XL) 50  MG 24 hr tablet Take 25 mg by mouth daily. Take with or immediately following a meal.    . Multiple Vitamins-Minerals (CENTRUM SILVER PO) Take 1 tablet by mouth daily.     . nitroGLYCERIN (NITROSTAT) 0.4 MG SL tablet PLACE 1 TABLET (0.4 MG TOTAL) UNDER THE TONGUE EVERY 5 (FIVE) MINUTES AS NEEDED FOR CHEST PAIN. 25 tablet 2  . Omega-3 Fatty Acids (FISH OIL OMEGA-3 PO) 2 (two) times daily. Take one tab by mouth daily - 2000mg      . omeprazole (PRILOSEC) 20 MG capsule Take 1 capsule (20 mg total) by mouth daily. 90 capsule 3  . testosterone cypionate (DEPOTESTOSTERONE CYPIONATE) 200 MG/ML injection Inject into the muscle every 14 (fourteen) days.     Marland Kitchen. ezetimibe (ZETIA) 10 MG tablet Take 1 tablet  (10 mg total) by mouth daily. 90 tablet 3   No current facility-administered medications for this visit.    Allergies:  Patient has no known allergies.   Social History: The patient  reports that he has been smoking cigarettes. He has a 32.50 pack-year smoking history. He has quit using smokeless tobacco.  His smokeless tobacco use included chew. He reports current alcohol use. He reports that he does not use drugs.   ROS:  Please see the history of present illness. Otherwise, complete review of systems is positive for none.  All other systems are reviewed and negative.   Physical Exam: VS:  BP 124/68   Pulse 72   Ht 5' 11.5" (1.816 m)   Wt 191 lb 12.8 oz (87 kg)   SpO2 98%   BMI 26.38 kg/m , BMI Body mass index is 26.38 kg/m.  Wt Readings from Last 3 Encounters:  05/24/19 191 lb 12.8 oz (87 kg)  10/05/18 201 lb (91.2 kg)  08/02/18 194 lb 3.2 oz (88.1 kg)    General: Patient appears comfortable at rest. HEENT: Conjunctiva and lids normal, wearing a mask. Neck: Supple, no elevated JVP or carotid bruits, no thyromegaly. Lungs: Clear to auscultation, nonlabored breathing at rest. Cardiac: Regular rate and rhythm, no S3 or significant systolic murmur, no pericardial rub. Abdomen: Soft, nontender, bowel sounds present. Extremities: No pitting edema, distal pulses 1-2+. Skin: Warm and dry. Musculoskeletal: No kyphosis. Neuropsychiatric: Alert and oriented x3, affect grossly appropriate.  ECG:  An ECG dated 08/02/2018 was personally reviewed today and demonstrated:  Sinus rhythm.  Recent Labwork:  May 2019: Hemoglobin A1c 7.2, hemoglobin 14.3, platelets 209, cholesterol 251, triglycerides 749, HDL 23, LDL not calculated, BUN 6, creatinine 0.96, potassium 3.6, AST 16, ALT 28 April 2019: Hemoglobin 14.6, platelets 225, BUN 16, creatinine 1.29, potassium 4.4, AST 21, ALT 27, hemoglobin A1c 7.6%, cholesterol 237, triglycerides 509, HDL 29, LDL 118  Other Studies Reviewed Today:   Cardiac catheterization 06/17/2015:  Prox RCA lesion, 100% stenosed.  LM lesion, 30% stenosed.  1st Mrg lesion, 50% stenosed. The lesion was previously treated with a stent (unknown type)greater than two years ago.  Prox Cx lesion, 50% stenosed.  Mid LAD lesion, 95% stenosed. Post intervention, there is a 0% residual stenosis.  1. Patency of the left mainstem with minor nonobstructive stenosis 2. Severe stenosis of the mid LAD, treated successfully with PCI using a single drug-eluting stents 3. Moderate in-stent restenosis in the left circumflex involving the first obtuse marginal branch 4. Chronic total occlusion of the proximal RCA with right to right collaterals noted  Lexiscan Myoview 08/20/2018:  There was no ST segment deviation noted during stress.  Findings consistent  with prior large inferior myocardial infarction with mild to moderate peri-infarct ischemia. There is significant adjacent gut radiotracer uptake that may affect findings,  The left ventricular ejection fraction is mildly decreased (45-54%).  This is an intermediate risk study. Risk based on mildly decreased LVEF and mild to moderate peri-infarct ischemia.  Lower extremity ABIs 10/25/2018: Summary: Right: 50-74% stenosis noted in the superficial femoral artery. 30-49% stenosis noted in the popliteal artery. 30-49% stenosis noted in the anterior tibial artery. ABIs are unreliable.  Left: 30-49% stenosis noted in the popliteal artery. 75-99% stenosis noted in the anterior tibial artery. Total occlusion noted in the posterior tibial artery. 50-74% stenosis noted in the peroneal artery.  Assessment and Plan:  1.  CAD status post DES to the LAD in 2015 with occlusion of the RCA associated with collaterals and moderate in-stent restenosis involving the circumflex/OM system.  We are managing him medically and he reports adequate symptom control at this time.  Follow-up Myoview from January is reviewed above.  I have  talked with him about pursuing a cardiac catheterization if symptoms progress.  No changes made to present antianginal regimen.  2.  Mixed hyperlipidemia with statin intolerance and significant hypertriglyceridemia.  Continue omega-3 supplements and gemfibrozil.  He agrees to a trial of Zetia.  Recent LDL 118.  3.  Essential hypertension, systolic is in the 120s today.  He reports compliance with his medications.  4.  PAD based on lower extremity arterial Dopplers.  He prefers to hold off on vascular consultation at this point.  No ulcerations.   Medication Adjustments/Labs and Tests Ordered: Current medicines are reviewed at length with the patient today.  Concerns regarding medicines are outlined above.   Tests Ordered: No orders of the defined types were placed in this encounter.   Medication Changes: Meds ordered this encounter  Medications  . ezetimibe (ZETIA) 10 MG tablet    Sig: Take 1 tablet (10 mg total) by mouth daily.    Dispense:  90 tablet    Refill:  3    05/24/2019 NEW    Disposition:  Follow up 6 months in the [place] office.  Signed, Jonelle Sidle, MD, [facility] 05/24/2019 12:09 PM    [facility] [address] [place], [place], [place] [address] Phone: [phone]; Fax: [phone]

## 2019-05-24 NOTE — Patient Instructions (Addendum)
Medication Instructions:   Your physician has recommended you make the following change in your medication:   Start zetia 10 mg by mouth daily  Continue all other medications the same  Labwork:  NONE  Testing/Procedures:  NONE  Follow-Up:  Your physician recommends that you schedule a follow-up appointment in: 6 months. You will receive a reminder letter in the mail in about 4 months reminding you to call and schedule your appointment. If you don't receive this letter, please contact our office.  Any Other Special Instructions Will Be Listed Below (If Applicable).  If you need a refill on your cardiac medications before your next appointment, please call your pharmacy.

## 2019-05-25 DIAGNOSIS — E1169 Type 2 diabetes mellitus with other specified complication: Secondary | ICD-10-CM | POA: Insufficient documentation

## 2019-05-29 ENCOUNTER — Other Ambulatory Visit: Payer: Self-pay

## 2019-05-30 ENCOUNTER — Encounter: Payer: Self-pay | Admitting: Family Medicine

## 2019-05-30 ENCOUNTER — Ambulatory Visit (INDEPENDENT_AMBULATORY_CARE_PROVIDER_SITE_OTHER): Payer: Medicare Other | Admitting: Family Medicine

## 2019-05-30 VITALS — BP 125/62 | HR 73 | Temp 98.0°F | Ht 71.5 in | Wt 194.0 lb

## 2019-05-30 DIAGNOSIS — E1169 Type 2 diabetes mellitus with other specified complication: Secondary | ICD-10-CM

## 2019-05-30 DIAGNOSIS — Z7689 Persons encountering health services in other specified circumstances: Secondary | ICD-10-CM

## 2019-05-30 DIAGNOSIS — Z23 Encounter for immunization: Secondary | ICD-10-CM | POA: Diagnosis not present

## 2019-05-30 DIAGNOSIS — E785 Hyperlipidemia, unspecified: Secondary | ICD-10-CM

## 2019-05-30 DIAGNOSIS — I10 Essential (primary) hypertension: Secondary | ICD-10-CM

## 2019-05-30 DIAGNOSIS — E1159 Type 2 diabetes mellitus with other circulatory complications: Secondary | ICD-10-CM

## 2019-05-30 DIAGNOSIS — E1165 Type 2 diabetes mellitus with hyperglycemia: Secondary | ICD-10-CM | POA: Diagnosis not present

## 2019-05-30 DIAGNOSIS — I25119 Atherosclerotic heart disease of native coronary artery with unspecified angina pectoris: Secondary | ICD-10-CM

## 2019-05-30 DIAGNOSIS — I152 Hypertension secondary to endocrine disorders: Secondary | ICD-10-CM

## 2019-05-30 DIAGNOSIS — N3943 Post-void dribbling: Secondary | ICD-10-CM

## 2019-05-30 NOTE — Patient Instructions (Signed)
You had labs performed today.  You will be contacted with the results of the labs once they are available, usually in the next 3 business days for routine lab work.  If you have an active my chart account, they will be released to your MyChart.  If you prefer to have these labs released to you via telephone, please let us know.   To do list:  Get your eye exam done. Have them send me the results  Work on diet.  Your sugar was NOT controlled on your last check with Katie.  We may need to increase your dose of Metformin  We will plan to check a FASTING cholesterol panel at your next visit.

## 2019-05-30 NOTE — Progress Notes (Signed)
Subjective: YI:FOYDXAJOI care, HTN, HLD HPI: Philip Ford is a 73 y.o. male presenting to clinic today for:  1. DM2 w/ HTN, HLD, CAD Patient is followed by Dr Diona Browner.  He notes history of stent placement in past.  He reports compliance with antihypertensives.  He has been taking the Zetia for 3 days now and so far doing ok.  No myalgia.  He smokes 1/2 ppd  Last eye exam: sees [facility], >1 year Last foot exam: needs Last A1c:  Lab Results  Component Value Date   HGBA1C 6.4 (H) 09/30/2013   Nephropathy screen indicated?: on ACE-I Last flu, zoster and/or pneumovax:  Immunization History  Administered Date(s) Administered  . Fluad Quad(high Dose 65+) 05/30/2019  . Tdap 09/27/2013    ROS: Denies dizziness, LOC, polyuria, polydipsia, unintended weight loss/gain, foot ulcerations, numbness or tingling in extremities, shortness of breath or chest pain.  2. Urinary dribbling Patient reports occasional nocturia but more often than not has post void dribbling and weak stream.  He is not on any prostate medicine but would be interested in starting.  No sexual activity in >24 hours.  Last PSA 2.0 in 07/17/2018.  Past Medical History:  Diagnosis Date  . Coronary artery disease    BMS to OM1 1999, DES to mid LAD 06/2015  . Essential hypertension   . GERD (gastroesophageal reflux disease)   . History of Helicobacter pylori infection   . History of skin cancer   . Hyperlipidemia   . Statin intolerance   . Type II diabetes mellitus (HCC)   . Vitamin D deficiency    Past Surgical History:  Procedure Laterality Date  . CARDIAC CATHETERIZATION N/A 06/17/2015   Procedure: Left Heart Cath and Coronary Angiography;  Surgeon: Tonny Bollman, MD;  Location: [facility];  Service: Cardiovascular;  Laterality: N/A;  . CORONARY ANGIOPLASTY WITH STENT PLACEMENT  1999; 06/17/2015   "2 + 1"  . HERNIA REPAIR    . TONSILLECTOMY AND ADENOIDECTOMY  ~ 1954  . UMBILICAL HERNIA REPAIR  "?early  2000's"   Social History   Socioeconomic History  . Marital status: Married    Spouse name: Not on file  . Number of children: Not on file  . Years of education: Not on file  . Highest education level: Not on file  Occupational History  . Not on file  Social Needs  . Financial resource strain: Not on file  . Food insecurity    Worry: Not on file    Inability: Not on file  . Transportation needs    Medical: Not on file    Non-medical: Not on file  Tobacco Use  . Smoking status: Current Every Day Smoker    Packs/day: 0.50    Years: 65.00    Pack years: 32.50    Types: Cigarettes  . Smokeless tobacco: Former Neurosurgeon    Types: Chew  . Tobacco comment: "stopped chewing in the 1970's"  Substance and Sexual Activity  . Alcohol use: Yes    Alcohol/week: 0.0 standard drinks    Comment: some  . Drug use: No  . Sexual activity: Not Currently  Lifestyle  . Physical activity    Days per week: Not on file    Minutes per session: Not on file  . Stress: Not on file  Relationships  . Social Musician on phone: Not on file    Gets together: Not on file    Attends religious service:  Not on file    Active member of club or organization: Not on file    Attends meetings of clubs or organizations: Not on file    Relationship status: Not on file  . Intimate partner violence    Fear of current or ex partner: Not on file    Emotionally abused: Not on file    Physically abused: Not on file    Forced sexual activity: Not on file  Other Topics Concern  . Not on file  Social History Narrative  . Not on file   Current Meds  Medication Sig  . Ascorbic Acid (VITAMIN C) 1000 MG tablet Take 1,000 mg by mouth daily.  Marland Kitchen aspirin EC 81 MG tablet Take 81 mg by mouth daily.  . clopidogrel (PLAVIX) 75 MG tablet Take 1 tablet (75 mg total) by mouth daily.  Marland Kitchen ezetimibe (ZETIA) 10 MG tablet Take 1 tablet (10 mg total) by mouth daily.  Marland Kitchen gemfibrozil (LOPID) 600 MG tablet Take 1 tablet (600  mg total) by mouth 2 (two) times daily before a meal.  . hydrochlorothiazide (HYDRODIURIL) 25 MG tablet Take 25 mg by mouth daily.   . isosorbide mononitrate (IMDUR) 30 MG 24 hr tablet Take 1 tablet (30 mg total) by mouth daily.  Marland Kitchen lisinopril (PRINIVIL,ZESTRIL) 10 MG tablet Take 10 mg by mouth daily.   . metFORMIN (GLUCOPHAGE-XR) 500 MG 24 hr tablet Take 1 tablet (500 mg total) by mouth daily.  . metoprolol succinate (TOPROL-XL) 50 MG 24 hr tablet Take 25 mg by mouth daily. Take with or immediately following a meal.  . Multiple Vitamins-Minerals (CENTRUM SILVER PO) Take 1 tablet by mouth daily.   . nitroGLYCERIN (NITROSTAT) 0.4 MG SL tablet PLACE 1 TABLET (0.4 MG TOTAL) UNDER THE TONGUE EVERY 5 (FIVE) MINUTES AS NEEDED FOR CHEST PAIN.  Marland Kitchen Omega-3 Fatty Acids (FISH OIL OMEGA-3 PO) 2 (two) times daily. Take one tab by mouth daily - 2000mg    . omeprazole (PRILOSEC) 20 MG capsule Take 1 capsule (20 mg total) by mouth daily.   Family History  Problem Relation Age of Onset  . Heart attack Father   . Heart attack Paternal Aunt   . Heart attack Paternal Uncle    No Known Allergies   Health Maintenance: Cologuard last year?, PNA (refused), Flu ROS: Per HPI  Objective: Office vital signs reviewed. BP 125/62   Pulse 73   Temp 98 F (36.7 C) (Temporal)   Ht 5' 11.5" (1.816 m)   Wt 194 lb (88 kg)   SpO2 98%   BMI 26.68 kg/m   Physical Examination:  General: Awake, alert, well nourished, No acute distress HEENT: Normal, sclera white, MMM Cardio: regular rate and rhythm, S1S2 heard, no murmurs appreciated Pulm: clear to auscultation bilaterally, no wheezes, rhonchi or rales; normal work of breathing on room air Extremities: warm, well perfused, No edema, cyanosis or clubbing; +2 pulses bilaterally MSK: normal gait and station Skin: dry; intact; no rashes or lesions Neuro: see DM foot  Diabetic Foot Exam - Simple   Simple Foot Form Diabetic Foot exam was performed with the following  findings: Yes 05/30/2019  5:41 PM  Visual Inspection No deformities, no ulcerations, no other skin breakdown bilaterally: Yes Sensation Testing Intact to touch and monofilament testing bilaterally: Yes Pulse Check Posterior Tibialis and Dorsalis pulse intact bilaterally: Yes Comments    Assessment/ Plan: 73 y.o. male   1. Uncontrolled type 2 diabetes mellitus with hyperglycemia (HCC) A1c noted to be uncontrolled at  last visit w/ previous PCP.  I reinforced dietary recommendations.  Will recheck in 2 months.  If persistently elevated, plan for increase of Metformin to BID.  Will scheduled DM eye exam.  DM foot performed.  Refused PNA.   2. Hypertension associated with diabetes (HCC) Controlled. Continue current regimen - Basic Metabolic Panel  3. Hyperlipidemia associated with type 2 diabetes mellitus (HCC) Continue statin for secondary prevention  4. Establishing care with new doctor, encounter for Will need to obtain Cologuard result  5. Coronary artery disease involving native coronary artery of native heart with angina pectoris (HCC) Continue BB, statin  6. Post-void dribbling Check PSA.  Plan for flomax - PSA  7. Need for immunization against influenza Administered  - Flu Vaccine QUAD High Dose(Fluad)   Raliegh IpAshly M Gottschalk, DO [facility] [phone]

## 2019-05-31 ENCOUNTER — Other Ambulatory Visit: Payer: Self-pay | Admitting: Family Medicine

## 2019-05-31 DIAGNOSIS — N3943 Post-void dribbling: Secondary | ICD-10-CM

## 2019-05-31 LAB — BASIC METABOLIC PANEL
BUN/Creatinine Ratio: 13 (ref 10–24)
BUN: 13 mg/dL (ref 8–27)
CO2: 22 mmol/L (ref 20–29)
Calcium: 9.7 mg/dL (ref 8.6–10.2)
Chloride: 100 mmol/L (ref 96–106)
Creatinine, Ser: 1.03 mg/dL (ref 0.76–1.27)
GFR calc Af Amer: 84 mL/min/{1.73_m2} (ref >59)
GFR calc non Af Amer: 72 mL/min/{1.73_m2} (ref >59)
Glucose: 189 mg/dL — ABNORMAL HIGH (ref 65–99)
Potassium: 4 mmol/L (ref 3.5–5.2)
Sodium: 139 mmol/L (ref 134–144)

## 2019-05-31 LAB — PSA: Prostate Specific Ag, Serum: 1.6 ng/mL (ref 0.0–4.0)

## 2019-05-31 MED ORDER — TAMSULOSIN HCL 0.4 MG PO CAPS: 0.4000 mg | ORAL_CAPSULE | Freq: Every day | ORAL | 3 refills | Status: AC

## 2019-06-05 ENCOUNTER — Encounter: Payer: Self-pay | Admitting: *Deleted

## 2019-06-13 ENCOUNTER — Ambulatory Visit: Payer: Self-pay | Admitting: Family Medicine

## 2019-06-13 ENCOUNTER — Other Ambulatory Visit: Payer: Self-pay | Admitting: Cardiology

## 2019-08-14 ENCOUNTER — Other Ambulatory Visit: Payer: Self-pay | Admitting: Cardiology

## 2019-08-14 MED ORDER — NITROGLYCERIN 0.4 MG SL SUBL: 0.4000 mg | SUBLINGUAL_TABLET | SUBLINGUAL | 2 refills | Status: DC | PRN

## 2019-08-14 NOTE — Telephone Encounter (Signed)
     1. Which medications need to be refilled? (please list name of each medication and dose if known) nitroGLYCERIN (NITROSTAT) 0.4 MG SL    2. Which pharmacy/location (including street and city if local pharmacy) is medication to be sent to    [place], [place]  3. Do they need a 30 day or 90 day supply?   [facility] told patient that they have been sending request to [facility].

## 2019-08-14 NOTE — Telephone Encounter (Signed)
Medication sent to pharmacy  

## 2019-08-30 ENCOUNTER — Other Ambulatory Visit: Payer: Self-pay

## 2019-09-02 ENCOUNTER — Ambulatory Visit (INDEPENDENT_AMBULATORY_CARE_PROVIDER_SITE_OTHER): Payer: Medicare Other | Admitting: Family Medicine

## 2019-09-02 ENCOUNTER — Encounter: Payer: Self-pay | Admitting: Family Medicine

## 2019-09-02 ENCOUNTER — Other Ambulatory Visit: Payer: Self-pay

## 2019-09-02 VITALS — BP 133/80 | HR 92 | Temp 97.7°F | Ht 71.0 in | Wt 191.0 lb

## 2019-09-02 DIAGNOSIS — E785 Hyperlipidemia, unspecified: Secondary | ICD-10-CM

## 2019-09-02 DIAGNOSIS — K219 Gastro-esophageal reflux disease without esophagitis: Secondary | ICD-10-CM

## 2019-09-02 DIAGNOSIS — E1165 Type 2 diabetes mellitus with hyperglycemia: Secondary | ICD-10-CM

## 2019-09-02 DIAGNOSIS — Z720 Tobacco use: Secondary | ICD-10-CM | POA: Insufficient documentation

## 2019-09-02 DIAGNOSIS — E1169 Type 2 diabetes mellitus with other specified complication: Secondary | ICD-10-CM | POA: Diagnosis not present

## 2019-09-02 DIAGNOSIS — E1159 Type 2 diabetes mellitus with other circulatory complications: Secondary | ICD-10-CM | POA: Diagnosis not present

## 2019-09-02 DIAGNOSIS — I25119 Atherosclerotic heart disease of native coronary artery with unspecified angina pectoris: Secondary | ICD-10-CM | POA: Diagnosis not present

## 2019-09-02 DIAGNOSIS — I10 Essential (primary) hypertension: Secondary | ICD-10-CM

## 2019-09-02 DIAGNOSIS — Z1211 Encounter for screening for malignant neoplasm of colon: Secondary | ICD-10-CM

## 2019-09-02 LAB — BAYER DCA HB A1C WAIVED: HB A1C (BAYER DCA - WAIVED): 8.1 % — ABNORMAL HIGH (ref <7.0)

## 2019-09-02 MED ORDER — PANTOPRAZOLE SODIUM 40 MG PO TBEC: 40.0000 mg | DELAYED_RELEASE_TABLET | Freq: Two times a day (BID) | ORAL | 0 refills | Status: DC

## 2019-09-02 MED ORDER — JANUMET XR 50-1000 MG PO TB24: 1.0000 | ORAL_TABLET | Freq: Every morning | ORAL | 0 refills | Status: DC

## 2019-09-02 NOTE — Patient Instructions (Signed)
I have changed your Metformin to Janumet because your blood sugar continues to climb.  I want you to start trying to cut out sugary foods like the honey buns you have been eating.  Your goal A1c is less than 7.  We will see each other in 3 months to recheck this.  For your symptoms at nighttime, I think this is uncontrolled acid reflux related to your hiatal hernia.  I have changed your prescription so that you take pantoprazole twice a day.  Do this for 2 weeks.  If your symptoms resolve during that 2 weeks you can stop and try going back to once daily.  If the symptoms return, stay on twice daily pantoprazole.  Get your diabetic eye exam done  I have sent in a product called Cologuard.  This will be mailed to your house.  Please return it within 2 weeks of receipt.

## 2019-09-02 NOTE — Progress Notes (Signed)
Subjective: CC:DM2 PCP: [name], DO Philip Ford is a 74 y.o. male presenting to clinic today for:  1. Type 2 Diabetes w/ HTN, HLD:  Patient reports that he does not check his blood sugars routinely.  He is compliant with metformin XR 500 mg daily.  He admits to not really following a low-carb diet, particularly over the holidays.  Denies any visual disturbance, polyuria, polydipsia, sensation changes.  No chest pain, shortness of breath.  He is compliant with his Zetia and has not been having any trouble with myalgia or intolerance.  He is compliant with his lisinopril, hydrochlorothiazide, metoprolol and gemfibrozil.  Last eye exam: Needs.  Has not yet scheduled Last foot exam: UTD Last A1c: 7.6 on 04/30/2019 Nephropathy screen indicated?: on ACEI Last flu, zoster and/or pneumovax: PNA declined Immunization History  Administered Date(s) Administered  . Fluad Quad(high Dose 65+) 05/30/2019  . Tdap 09/27/2013   2.  Atypical Chest pain Patient reports that he has a burning sensation in his chest that radiates up into his throat when he lies down at nighttime.  This not occur outside of lying down.  He denies any change in exercise tolerance, nausea.  He takes pantoprazole he thinks 40 mg daily.  He was considering scheduling an appointment with his cardiologist to follow-up on this.  He did take nitro which seemed to relieve the pain some.  3.  Preventative care Patient reports that he has never had a colonoscopy and does not really want to have one because he had several friends of had various degrees of complications including perforations.  He typically was being monitored with an FOBT every year.  He has never used Cologuard but would be amenable to this.  Denies any rectal bleeding, unplanned weight loss, night sweats, family history of colon cancers.  He has never had a positive FOBT.   ROS: Per HPI  No Known Allergies Past Medical History:  Diagnosis Date  .  Coronary artery disease    BMS to [place], DES to mid LAD 06/2015  . Essential hypertension   . GERD (gastroesophageal reflux disease)   . History of Helicobacter pylori infection   . History of skin cancer   . Hyperlipidemia   . Statin intolerance   . Type II diabetes mellitus ([place])   . Vitamin D deficiency     Current Outpatient Medications:  .  Ascorbic Acid (VITAMIN C) 1000 MG tablet, Take 1,000 mg by mouth daily., Disp: , Rfl:  .  aspirin EC 81 MG tablet, Take 81 mg by mouth daily., Disp: , Rfl:  .  clopidogrel (PLAVIX) 75 MG tablet, Take 1 tablet (75 mg total) by mouth daily., Disp: 90 tablet, Rfl: 3 .  ezetimibe (ZETIA) 10 MG tablet, Take 1 tablet (10 mg total) by mouth daily., Disp: 90 tablet, Rfl: 3 .  gemfibrozil (LOPID) 600 MG tablet, Take 1 tablet (600 mg total) by mouth 2 (two) times daily before a meal., Disp: 180 tablet, Rfl: 3 .  hydrochlorothiazide (HYDRODIURIL) 25 MG tablet, Take 25 mg by mouth daily. , Disp: , Rfl:  .  isosorbide mononitrate (IMDUR) 30 MG 24 hr tablet, TAKE 1 TABLET BY MOUTH  DAILY, Disp: 90 tablet, Rfl: 3 .  lisinopril (PRINIVIL,ZESTRIL) 10 MG tablet, Take 10 mg by mouth daily. , Disp: , Rfl:  .  metFORMIN (GLUCOPHAGE-XR) 500 MG 24 hr tablet, Take 1 tablet (500 mg total) by mouth daily., Disp: , Rfl:  .  metoprolol succinate (  TOPROL-XL) 50 MG 24 hr tablet, Take 25 mg by mouth daily. Take with or immediately following a meal., Disp: , Rfl:  .  Multiple Vitamins-Minerals (CENTRUM SILVER PO), Take 1 tablet by mouth daily. , Disp: , Rfl:  .  nitroGLYCERIN (NITROSTAT) 0.4 MG SL tablet, Place 1 tablet (0.4 mg total) under the tongue every 5 (five) minutes as needed for chest pain., Disp: 25 tablet, Rfl: 2 .  Omega-3 Fatty Acids (FISH OIL OMEGA-3 PO), 2 (two) times daily. Take one tab by mouth daily - 2000mg  , Disp: , Rfl:  .  omeprazole (PRILOSEC) 20 MG capsule, Take 1 capsule (20 mg total) by mouth daily., Disp: 90 capsule, Rfl: 3 .  tamsulosin (FLOMAX) 0.4  MG CAPS capsule, Take 1 capsule (0.4 mg total) by mouth daily., Disp: 30 capsule, Rfl: 3 Social History   Socioeconomic History  . Marital status: Married    Spouse name: Not on file  . Number of children: Not on file  . Years of education: Not on file  . Highest education level: Not on file  Occupational History  . Not on file  Tobacco Use  . Smoking status: Current Every Day Smoker    Packs/day: 0.50    Years: 65.00    Pack years: 32.50    Types: Cigarettes  . Smokeless tobacco: Former    Types: Chew  . Tobacco comment: "stopped chewing in the 1970's"  Substance and Sexual Activity  . Alcohol use: Yes    Alcohol/week: 0.0 standard drinks    Comment: some  . Drug use: No  . Sexual activity: Not Currently  Other Topics Concern  . Not on file  Social History Narrative  . Not on file   Social Determinants of Health   Financial Resource Strain:   . Difficulty of Paying Living Expenses: Not on file  Food Insecurity:   . Worried About Neurosurgeon in the Last Year: Not on file  . Ran Out of Food in the Last Year: Not on file  Transportation Needs:   . Lack of Transportation (Medical): Not on file  . Lack of Transportation (Non-Medical): Not on file  Physical Activity:   . Days of Exercise per Week: Not on file  . Minutes of Exercise per Session: Not on file  Stress:   . Feeling of Stress : Not on file  Social Connections:   . Frequency of Communication with Friends and Family: Not on file  . Frequency of Social Gatherings with Friends and Family: Not on file  . Attends Religious Services: Not on file  . Active Member of Clubs or Organizations: Not on file  . Attends Programme researcher, broadcasting/film/video Meetings: Not on file  . Marital Status: Not on file  Intimate Partner Violence:   . Fear of Current or Ex-Partner: Not on file  . Emotionally Abused: Not on file  . Physically Abused: Not on file  . Sexually Abused: Not on file   Family History  Problem Relation Age  of Onset  . Heart attack Father   . Heart attack Paternal Aunt   . Heart attack Paternal Uncle     Objective: Office vital signs reviewed. BP 133/80   Pulse 92   Temp 97.7 F (36.5 C) (Temporal)   Ht 5\' 11"  (1.803 m)   Wt 191 lb (86.6 kg)   SpO2 97%   BMI 26.64 kg/m   Physical Examination:  General: Awake, alert, well nourished, No acute distress HEENT: Normal,  sclera white, MMM Cardio: regular rate and rhythm, S1S2 heard, no murmurs appreciated Pulm: intermittent expiratory wheezes. No rhonchi or rales; normal work of breathing on room air Extremities: warm, well perfused, No edema, cyanosis or clubbing; +2 pulses bilaterally  Assessment/ Plan: 74 y.o. male   1. Uncontrolled type 2 diabetes mellitus with hyperglycemia (HCC) Sugar remains elevated with rising A1c to 8.1 today.  I have discontinued the Metformin XR 500 and added Janumet 50-1000 mg daily.  I reinforced need to decrease sugar intake.  He identified honey buns as an issue for him lately.  We will follow-up in 3 months.  He will schedule his diabetic eye exam - Bayer DCA Hb A1c Waived - SitaGLIPtin-MetFORMIN HCl (JANUMET XR) 50-1000 MG TB24; Take 1 tablet by mouth every morning.  Dispense: 90 tablet; Refill: 0  2. Hypertension associated with diabetes (HCC) Controlled  3. Hyperlipidemia associated with type 2 diabetes mellitus (HCC) Check direct LDL and hepatic function panel given compliance with Zetia - LDL Cholesterol, Direct - Hepatic Function Panel  4. Coronary artery disease involving native coronary artery of native heart with angina pectoris (HCC) At this time I do not think that the symptoms he is describing is related to the heart.  I think it is fine for him to get a checkup with his cardiologist if he desires but have instructed him to increase his pantoprazole as below  5. Tobacco use Counseling performed.  Patient is contemplative  6. Screen for colon cancer Cologuard ordered.  Instructions  given to patient - Cologuard  7. Gastroesophageal reflux disease without esophagitis Increase pantoprazole to twice daily for at least 2 weeks.  Can trial back to 1 a day after 2 weeks if symptoms are well controlled.  But if symptoms return, continue twice daily dosing.  If symptoms do not resolve with increased PPI dose, low threshold to be reevaluated.  Would consider referral to gastroenterology for EGD at that point. - pantoprazole (PROTONIX) 40 MG tablet; Take 1 tablet (40 mg total) by mouth 2 (two) times daily.  Dispense: 180 tablet; Refill: 0   Orders Placed This Encounter  Procedures  . Bayer DCA Hb A1c Waived  . LDL Cholesterol, Direct  . Hepatic Function Panel   No orders of the defined types were placed in this encounter.    Raliegh Ip, DO [facility] [phone]

## 2019-09-03 LAB — HEPATIC FUNCTION PANEL
ALT: 22 IU/L (ref 0–44)
AST: 21 IU/L (ref 0–40)
Albumin: 4.4 g/dL (ref 3.7–4.7)
Alkaline Phosphatase: 70 IU/L (ref 39–117)
Bilirubin Total: 0.4 mg/dL (ref 0.0–1.2)
Bilirubin, Direct: 0.13 mg/dL (ref 0.00–0.40)
Total Protein: 7.8 g/dL (ref 6.0–8.5)

## 2019-09-03 LAB — LDL CHOLESTEROL, DIRECT: LDL Direct: 122 mg/dL — ABNORMAL HIGH (ref 0–99)

## 2019-09-11 ENCOUNTER — Telehealth: Payer: Self-pay | Admitting: Cardiology

## 2019-09-11 NOTE — Telephone Encounter (Signed)
Pt aware.

## 2019-09-11 NOTE — Telephone Encounter (Signed)
Patient called stating that he was put on SitaGLIPtin-MetFORMIN HCl (JANUMET today by his PCP. He is concerned about taking medication with his heart condition.

## 2019-09-11 NOTE — Telephone Encounter (Signed)
I would not be opposed to this change in his medication.  He should continue to follow blood sugar control with his PCP.

## 2019-10-02 ENCOUNTER — Other Ambulatory Visit: Payer: Self-pay | Admitting: *Deleted

## 2019-10-02 MED ORDER — NITROGLYCERIN 0.4 MG SL SUBL: 0.4000 mg | SUBLINGUAL_TABLET | SUBLINGUAL | 3 refills | Status: DC | PRN

## 2019-10-15 ENCOUNTER — Ambulatory Visit: Payer: Medicare Other | Admitting: Cardiology

## 2019-10-28 ENCOUNTER — Other Ambulatory Visit: Payer: Self-pay | Admitting: *Deleted

## 2019-10-28 MED ORDER — HYDROCHLOROTHIAZIDE 25 MG PO TABS: 25.0000 mg | ORAL_TABLET | Freq: Every day | ORAL | 0 refills | Status: DC

## 2019-11-03 ENCOUNTER — Other Ambulatory Visit: Payer: Self-pay | Admitting: Family Medicine

## 2019-11-03 DIAGNOSIS — E1165 Type 2 diabetes mellitus with hyperglycemia: Secondary | ICD-10-CM

## 2019-11-19 ENCOUNTER — Other Ambulatory Visit: Payer: Self-pay | Admitting: Cardiology

## 2019-11-29 ENCOUNTER — Other Ambulatory Visit: Payer: Self-pay | Admitting: Family Medicine

## 2019-11-29 DIAGNOSIS — K219 Gastro-esophageal reflux disease without esophagitis: Secondary | ICD-10-CM

## 2019-12-04 ENCOUNTER — Encounter: Payer: Self-pay | Admitting: Family Medicine

## 2019-12-04 ENCOUNTER — Ambulatory Visit (INDEPENDENT_AMBULATORY_CARE_PROVIDER_SITE_OTHER): Payer: Medicare Other | Admitting: Family Medicine

## 2019-12-04 ENCOUNTER — Telehealth: Payer: Self-pay | Admitting: Family Medicine

## 2019-12-04 ENCOUNTER — Other Ambulatory Visit: Payer: Self-pay

## 2019-12-04 VITALS — BP 138/82 | HR 81 | Temp 98.0°F | Ht 71.0 in | Wt 177.0 lb

## 2019-12-04 DIAGNOSIS — G720 Drug-induced myopathy: Secondary | ICD-10-CM

## 2019-12-04 DIAGNOSIS — E1169 Type 2 diabetes mellitus with other specified complication: Secondary | ICD-10-CM

## 2019-12-04 DIAGNOSIS — E785 Hyperlipidemia, unspecified: Secondary | ICD-10-CM | POA: Diagnosis not present

## 2019-12-04 DIAGNOSIS — R82998 Other abnormal findings in urine: Secondary | ICD-10-CM | POA: Diagnosis not present

## 2019-12-04 DIAGNOSIS — E1159 Type 2 diabetes mellitus with other circulatory complications: Secondary | ICD-10-CM

## 2019-12-04 DIAGNOSIS — Z720 Tobacco use: Secondary | ICD-10-CM | POA: Diagnosis not present

## 2019-12-04 DIAGNOSIS — E118 Type 2 diabetes mellitus with unspecified complications: Secondary | ICD-10-CM | POA: Diagnosis not present

## 2019-12-04 DIAGNOSIS — I25119 Atherosclerotic heart disease of native coronary artery with unspecified angina pectoris: Secondary | ICD-10-CM

## 2019-12-04 DIAGNOSIS — E1165 Type 2 diabetes mellitus with hyperglycemia: Secondary | ICD-10-CM | POA: Diagnosis not present

## 2019-12-04 DIAGNOSIS — I10 Essential (primary) hypertension: Secondary | ICD-10-CM

## 2019-12-04 DIAGNOSIS — R634 Abnormal weight loss: Secondary | ICD-10-CM

## 2019-12-04 LAB — MICROSCOPIC EXAMINATION
Bacteria, UA: NONE SEEN
Epithelial Cells (non renal): NONE SEEN /hpf (ref 0–10)
Renal Epithel, UA: NONE SEEN /hpf
WBC, UA: NONE SEEN /hpf (ref 0–5)

## 2019-12-04 LAB — URINALYSIS, COMPLETE
Bilirubin, UA: NEGATIVE
Glucose, UA: NEGATIVE
Ketones, UA: NEGATIVE
Leukocytes,UA: NEGATIVE
Nitrite, UA: NEGATIVE
Protein,UA: NEGATIVE
Specific Gravity, UA: 1.02 (ref 1.005–1.030)
Urobilinogen, Ur: 0.2 mg/dL (ref 0.2–1.0)
pH, UA: 5 (ref 5.0–7.5)

## 2019-12-04 LAB — BAYER DCA HB A1C WAIVED: HB A1C (BAYER DCA - WAIVED): 6.1 % (ref <7.0)

## 2019-12-04 MED ORDER — JANUMET XR 50-1000 MG PO TB24: 1.0000 | ORAL_TABLET | Freq: Every morning | ORAL | 3 refills | Status: DC

## 2019-12-04 NOTE — Telephone Encounter (Signed)
Regarding previous message- Pt just wanted to let nurse Almira Coaster know when he had his COVID Vaccines and last Eye Exam.  10/09/2019- 1st COVID Vaccine 11/06/2019- 2nd COVID Vaccine  11/05/2019- Last Eye Exam Patient did schedule appt to have Diabetic Retinopathy Exam, here on 12/20/2019.

## 2019-12-04 NOTE — Progress Notes (Signed)
Subjective: CC:DM2 PCP: [name], DO Philip Ford is a 74 y.o. male presenting to clinic today for:  1. Type 2 Diabetes w/ HTN, HLD:  At last visit his blood sugar continued to rise so his medications were changed from Metformin to Janumet XR 50-1000 mg daily.  He was going to cut back on honey buns, which he was consuming regularly.  He is compliant with his Zetia. He is compliant with his lisinopril, hydrochlorothiazide, metoprolol and gemfibrozil.  He has been having some sunburns on his face and wonders what should of his medicines could be causing this.  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  He wears sunscreen on his ears and a hat but does not actually sunscreen his face or arms.  He does have history of skin cancers that were removed from upper extremities previously.  He wonders if he really needs to be on all the medications he is on for his heart, blood pressure, cholesterol and diabetes.  Last eye exam: Needs.  Has not yet scheduled  Last foot exam: UTD Last A1c: Lab Results  Component Value Date   HGBA1C 8.1 (H) 09/02/2019  Nephropathy screen indicated?: on ACEI Last flu, zoster and/or pneumovax: PNA declined Immunization History  Administered Date(s) Administered  . Fluad Quad(high Dose 65+) 05/30/2019  . Tdap 09/27/2013   2.  Atypical Chest pain Resolved.  Back on pantoprazole 40 mg daily.  Has follow-up visit with cardiology in June.  3.  Weight loss Patient notes that since her last visit he has lost some weight.  He does identify that he is totally eliminated sweets and "drinks" from his diet.  He is only had 6 beers in the last month.  This is down from daily consumption.  He does admit to feeling fatigued.  He has had some dark urine with pink tinge to it and he worries about this.  Denies any dysuria.  No fevers.  No night sweats.  He continues to smoke.  Denies any hemoptysis.  No rectal bleeding.  Has not returned Cologuard  yet.   ROS: Per HPI  No Known Allergies Past Medical History:  Diagnosis Date  . Coronary artery disease    BMS to [place], DES to mid LAD 06/2015  . Essential hypertension   . GERD (gastroesophageal reflux disease)   . History of Helicobacter pylori infection   . History of skin cancer   . Hyperlipidemia   . Statin intolerance   . Type II diabetes mellitus ([place])   . Vitamin D deficiency     Current Outpatient Medications:  .  Ascorbic Acid (VITAMIN C) 1000 MG tablet, Take 1,000 mg by mouth daily., Disp: , Rfl:  .  aspirin EC 81 MG tablet, Take 81 mg by mouth daily., Disp: , Rfl:  .  clopidogrel (PLAVIX) 75 MG tablet, Take 1 tablet (75 mg total) by mouth daily., Disp: 90 tablet, Rfl: 3 .  ezetimibe (ZETIA) 10 MG tablet, Take 1 tablet (10 mg total) by mouth daily., Disp: 90 tablet, Rfl: 3 .  gemfibrozil (LOPID) 600 MG tablet, Take 1 tablet (600 mg total) by mouth 2 (two) times daily before a meal., Disp: 180 tablet, Rfl: 3 .  hydrochlorothiazide (HYDRODIURIL) 25 MG tablet, Take 1 tablet (25 mg total) by mouth daily., Disp: 90 tablet, Rfl: 0 .  isosorbide mononitrate (IMDUR) 30 MG 24 hr tablet, TAKE 1 TABLET BY MOUTH  DAILY, Disp: 90 tablet, Rfl: 3 .  JANUMET XR 50-1000 MG TB24, TAKE 1 TABLET BY MOUTH IN  THE MORNING, Disp: 90 tablet, Rfl: 0 .  lisinopril (PRINIVIL,ZESTRIL) 10 MG tablet, Take 10 mg by mouth daily. , Disp: , Rfl:  .  metoprolol succinate (TOPROL-XL) 50 MG 24 hr tablet, Take 25 mg by mouth daily. Take with or immediately following a meal., Disp: , Rfl:  .  Multiple Vitamins-Minerals (CENTRUM SILVER PO), Take 1 tablet by mouth daily. , Disp: , Rfl:  .  nitroGLYCERIN (NITROSTAT) 0.4 MG SL tablet, Place 1 tablet (0.4 mg total) under the tongue every 5 (five) minutes as needed for chest pain., Disp: 25 tablet, Rfl: 3 .  Omega-3 Fatty Acids (FISH OIL OMEGA-3 PO), 2 (two) times daily. Take one tab by mouth daily - 2032m , Disp: , Rfl:  .  pantoprazole (PROTONIX) 40 MG  tablet, TAKE 1 TABLET BY MOUTH  TWICE DAILY, Disp: 180 tablet, Rfl: 0 .  tamsulosin (FLOMAX) 0.4 MG CAPS capsule, Take 1 capsule (0.4 mg total) by mouth daily., Disp: 30 capsule, Rfl: 3 Social History   Socioeconomic History  . Marital status: Married    Spouse name: Not on file  . Number of children: Not on file  . Years of education: Not on file  . Highest education level: Not on file  Occupational History  . Not on file  Tobacco Use  . Smoking status: Current Every Day Smoker    Packs/day: 0.50    Years: 65.00    Pack years: 32.50    Types: Cigarettes  . Smokeless tobacco: Former USystems developer   Types: Chew  . Tobacco comment: "stopped chewing in the 1970's"  Substance and Sexual Activity  . Alcohol use: Yes    Alcohol/week: 0.0 standard drinks    Comment: some  . Drug use: No  . Sexual activity: Not Currently  Other Topics Concern  . Not on file  Social History Narrative  . Not on file   Social Determinants of Health   Financial Resource Strain:   . Difficulty of Paying Living Expenses:   Food Insecurity:   . Worried About RCharity fundraiserin the Last Year:   . RArboriculturistin the Last Year:   Transportation Needs:   . LFilm/video editor(Medical):   .Marland KitchenLack of Transportation (Non-Medical):   Physical Activity:   . Days of Exercise per Week:   . Minutes of Exercise per Session:   Stress:   . Feeling of Stress :   Social Connections:   . Frequency of Communication with Friends and Family:   . Frequency of Social Gatherings with Friends and Family:   . Attends Religious Services:   . Active Member of Clubs or Organizations:   . Attends CArchivistMeetings:   .Marland KitchenMarital Status:   Intimate Partner Violence:   . Fear of Current or Ex-Partner:   . Emotionally Abused:   .Marland KitchenPhysically Abused:   . Sexually Abused:    Family History  Problem Relation Age of Onset  . Heart attack Father   . Heart attack Paternal Aunt   . Heart attack Paternal Uncle      Objective: Office vital signs reviewed. BP 138/82   Pulse 81   Temp 98 F (36.7 C) (Temporal)   Ht 5' 11" (1.803 m)   Wt 177 lb (80.3 kg)   SpO2 96%   BMI 24.69 kg/m   Physical Examination:  General: Awake, alert, well nourished, No  acute distress HEENT: Normal, sclera white, MMM Cardio: regular rate and rhythm, S1S2 heard, no murmurs appreciated Pulm: intermittent expiratory wheezes. No rhonchi or rales; normal work of breathing on room air Extremities: warm, well perfused, No edema, cyanosis or clubbing; +2 pulses bilaterally  Assessment/ Plan: 74 y.o. male   1. Controlled type 2 diabetes mellitus with hyperglycemia (HCC) Excellent improvement in A1c with diet modification and addition of Janumet.  A1c down to 6.1 today.  Continue current regimen. - Bayer DCA Hb A1c Waived  2. Hypertension associated with diabetes ([place]) Controlled.  Will discontinue hydrochlorothiazide because of his ongoing sunburn.  I reinforced need for application of sunscreen.  He is to monitor his blood pressures closely and come in in 1 week for blood pressure recheck.  May need to increase lisinopril to 20 mg daily if above goal.  Patient aware of this  3. Hyperlipidemia associated with type 2 diabetes mellitus ([place]) Continue current regimen.  Intolerant to statins.  I think he may benefit from injectable and he will discuss this further with cardiology - Lipid Panel  4. Coronary artery disease involving native coronary artery of native heart with angina pectoris ([place])  5. Tobacco use  6. Weight loss, non-intentional Uncertain etiology.  Possibly related to diet modifications as above but given fatigue and possible blood in urine will evaluate further. - TSH - CBC with Differential - CMP14+EGFR - PSA  7. Dark urine We discussed the risk of tobacco use on bladder cancers and other cancers.  He voiced good understanding.  May need to consider eval by urology. - Urinalysis,  Complete    Orders Placed This Encounter  Procedures  . Bayer DCA Hb A1c Waived  . Lipid Panel  . TSH  . CBC with Differential  . CMP14+EGFR  . PSA  . Urinalysis, Complete   Meds ordered this encounter  Medications  . SitaGLIPtin-MetFORMIN HCl (JANUMET XR) 50-1000 MG TB24    Sig: Take 1 tablet by mouth every morning.    Dispense:  90 tablet    Refill:  3    Requesting 1 year supply     [name], [place] [phone]

## 2019-12-04 NOTE — Telephone Encounter (Signed)
Please call.

## 2019-12-04 NOTE — Telephone Encounter (Signed)
For nurse review. 

## 2019-12-04 NOTE — Patient Instructions (Addendum)
You had labs performed today.  You will be contacted with the results of the labs once they are available, usually in the next 3 business days for routine lab work.  If you have an active my chart account, they will be released to your MyChart.  If you prefer to have these labs released to you via telephone, please let us know.  Some of the weight loss may be from stopping all the sugar you were eating.  Your A1c is normal now, showing that diabetes is well controlled.  However, it is always a possibility that weight loss could be caused by malignancy i.e. cancer.  I am concerned about the possible blood that you have been seen in your urine.  Urine to collect a urinalysis today as well some labs as above.  I have recommended that you consider seeing a urologist for further evaluation of this.  Given your history of smoking it does increase your risk of bladder cancers.  Stop the hydrochlorothiazide.  This does increase risk of sunburns.  You need to wear sunscreen on all sun exposed areas when you go outside.  Monitor your blood pressures closely.  Your goal blood pressure is less than 140/90.  If it goes above this, we need to consider increasing the lisinopril.  Come back in 1 to 2 weeks for blood pressure recheck with the nurse

## 2019-12-05 ENCOUNTER — Ambulatory Visit (INDEPENDENT_AMBULATORY_CARE_PROVIDER_SITE_OTHER): Payer: Medicare Other | Admitting: *Deleted

## 2019-12-05 DIAGNOSIS — Z0000 Encounter for general adult medical examination without abnormal findings: Secondary | ICD-10-CM | POA: Diagnosis not present

## 2019-12-05 LAB — LIPID PANEL
Chol/HDL Ratio: 5.9 ratio — ABNORMAL HIGH (ref 0.0–5.0)
Cholesterol, Total: 177 mg/dL (ref 100–199)
HDL: 30 mg/dL — ABNORMAL LOW (ref >39)
LDL Chol Calc (NIH): 115 mg/dL — ABNORMAL HIGH (ref 0–99)
Triglycerides: 177 mg/dL — ABNORMAL HIGH (ref 0–149)
VLDL Cholesterol Cal: 32 mg/dL (ref 5–40)

## 2019-12-05 LAB — CMP14+EGFR
ALT: 10 IU/L (ref 0–44)
AST: 17 IU/L (ref 0–40)
Albumin/Globulin Ratio: 1.4 (ref 1.2–2.2)
Albumin: 4.6 g/dL (ref 3.7–4.7)
Alkaline Phosphatase: 71 IU/L (ref 39–117)
BUN/Creatinine Ratio: 15 (ref 10–24)
BUN: 18 mg/dL (ref 8–27)
Bilirubin Total: 0.2 mg/dL (ref 0.0–1.2)
CO2: 24 mmol/L (ref 20–29)
Calcium: 10.2 mg/dL (ref 8.6–10.2)
Chloride: 99 mmol/L (ref 96–106)
Creatinine, Ser: 1.21 mg/dL (ref 0.76–1.27)
GFR calc Af Amer: 69 mL/min/{1.73_m2} (ref >59)
GFR calc non Af Amer: 59 mL/min/{1.73_m2} — ABNORMAL LOW (ref >59)
Globulin, Total: 3.4 g/dL (ref 1.5–4.5)
Glucose: 124 mg/dL — ABNORMAL HIGH (ref 65–99)
Potassium: 4.3 mmol/L (ref 3.5–5.2)
Sodium: 140 mmol/L (ref 134–144)
Total Protein: 8 g/dL (ref 6.0–8.5)

## 2019-12-05 LAB — CBC WITH DIFFERENTIAL/PLATELET
Basophils Absolute: 0.2 10*3/uL (ref 0.0–0.2)
Basos: 1 %
EOS (ABSOLUTE): 0.9 10*3/uL — ABNORMAL HIGH (ref 0.0–0.4)
Eos: 7 %
Hematocrit: 43.6 % (ref 37.5–51.0)
Hemoglobin: 15 g/dL (ref 13.0–17.7)
Immature Grans (Abs): 0 10*3/uL (ref 0.0–0.1)
Immature Granulocytes: 0 %
Lymphocytes Absolute: 2.6 10*3/uL (ref 0.7–3.1)
Lymphs: 19 %
MCH: 31.7 pg (ref 26.6–33.0)
MCHC: 34.4 g/dL (ref 31.5–35.7)
MCV: 92 fL (ref 79–97)
Monocytes Absolute: 1.2 10*3/uL — ABNORMAL HIGH (ref 0.1–0.9)
Monocytes: 9 %
Neutrophils Absolute: 8.3 10*3/uL — ABNORMAL HIGH (ref 1.4–7.0)
Neutrophils: 64 %
Platelets: 300 10*3/uL (ref 150–450)
RBC: 4.73 x10E6/uL (ref 4.14–5.80)
RDW: 13 % (ref 11.6–15.4)
WBC: 13.1 10*3/uL — ABNORMAL HIGH (ref 3.4–10.8)

## 2019-12-05 LAB — PSA: Prostate Specific Ag, Serum: 2.3 ng/mL (ref 0.0–4.0)

## 2019-12-05 LAB — TSH: TSH: 1.82 u[IU]/mL (ref 0.450–4.500)

## 2019-12-05 NOTE — Progress Notes (Signed)
MEDICARE ANNUAL WELLNESS VISIT  12/05/2019  Telephone Visit Disclaimer This Medicare AWV was conducted by telephone due to national recommendations for restrictions regarding the COVID-19 Pandemic (e.g. social distancing).  I verified, using two identifiers, that I am speaking with Philip Ford or their authorized healthcare agent. I discussed the limitations, risks, security, and privacy concerns of performing an evaluation and management service by telephone and the potential availability of an in-person appointment in the future. The patient expressed understanding and agreed to proceed.   Subjective:  Philip Ford is a 74 y.o. male patient of Philip Norlander, DO who had a Medicare Annual Wellness Visit today via telephone. Aydrian is Retired and lives with their spouse. he has 0 biological children. he reports that he is not socially active and does interact with friends/family regularly. he is not physically active and enjoys gardening.  Patient Care Team: Philip Norlander, DO as PCP - General (Family Medicine) Satira Sark, MD as PCP - Cardiology (Cardiology)  Advanced Directives 12/05/2019 06/17/2015 09/28/2013  Does Patient Have a Medical Advance Directive? No No Patient does not have advance directive;Patient would not like information  Would patient like information on creating a medical advance directive? No - Patient declined No - patient declined information -  Pre-existing out of facility DNR order (yellow form or pink MOST form) - - No    Hospital Utilization Over the Past 12 Months: # of hospitalizations or ER visits: 0 # of surgeries: 0  Review of Systems    Patient reports that his overall health is better compared to last year.  History obtained from chart review and the patient  Patient Reported Readings (BP, Pulse, CBG, Weight, etc) none  Pain Assessment Pain : No/denies pain     Current Medications & Allergies (verified) Allergies as of  12/05/2019   No Known Allergies     Medication List       Accurate as of December 05, 2019 10:08 AM. If you have any questions, ask your nurse or doctor.        aspirin EC 81 MG tablet Take 81 mg by mouth daily.   CENTRUM SILVER PO Take 1 tablet by mouth daily.   clopidogrel 75 MG tablet Commonly known as: PLAVIX Take 1 tablet (75 mg total) by mouth daily.   ezetimibe 10 MG tablet Commonly known as: ZETIA Take 1 tablet (10 mg total) by mouth daily.   FISH OIL OMEGA-3 PO 2 (two) times daily. Take one tab by mouth daily - 2052m   gemfibrozil 600 MG tablet Commonly known as: LOPID Take 1 tablet (600 mg total) by mouth 2 (two) times daily before a meal.   isosorbide mononitrate 30 MG 24 hr tablet Commonly known as: IMDUR TAKE 1 TABLET BY MOUTH  DAILY   Janumet XR 50-1000 MG Tb24 Generic drug: SitaGLIPtin-MetFORMIN HCl Take 1 tablet by mouth every morning.   lisinopril 10 MG tablet Commonly known as: ZESTRIL Take 10 mg by mouth daily.   metoprolol succinate 50 MG 24 hr tablet Commonly known as: TOPROL-XL Take 25 mg by mouth daily. Take with or immediately following a meal.   nitroGLYCERIN 0.4 MG SL tablet Commonly known as: NITROSTAT Place 1 tablet (0.4 mg total) under the tongue every 5 (five) minutes as needed for chest pain.   pantoprazole 40 MG tablet Commonly known as: PROTONIX TAKE 1 TABLET BY MOUTH  TWICE DAILY   tamsulosin 0.4 MG Caps capsule Commonly known as:  FLOMAX Take 1 capsule (0.4 mg total) by mouth daily.   vitamin C 1000 MG tablet Take 1,000 mg by mouth daily.       History (reviewed): Past Medical History:  Diagnosis Date  . Coronary artery disease    BMS to [place], DES to mid LAD 06/2015  . Essential hypertension   . GERD (gastroesophageal reflux disease)   . History of Helicobacter pylori infection   . History of skin cancer   . Hyperlipidemia   . Statin intolerance   . Type II diabetes mellitus ([place])   . Vitamin D deficiency     Past Surgical History:  Procedure Laterality Date  . CARDIAC CATHETERIZATION N/A 06/17/2015   Procedure: Left Heart Cath and Coronary Angiography;  Surgeon: Sherren Mocha, MD;  Location: [place] [facility];  Service: Cardiovascular;  Laterality: N/A;  . [place]; 06/17/2015   "2 + 1"  . HERNIA REPAIR    . TONSILLECTOMY AND ADENOIDECTOMY  ~ 1954  . UMBILICAL HERNIA REPAIR  "?early 2000's"   Family History  Problem Relation Age of Onset  . Heart attack Father   . Heart attack Paternal Aunt   . Heart attack Paternal Uncle   . Healthy Mother    Social History   Socioeconomic History  . Marital status: Married    Spouse name: ann  . Number of children: 0  . Years of education: 58  . Highest education level: 12th grade  Occupational History  . Not on file  Tobacco Use  . Smoking status: Current Every Day Smoker    Packs/day: 0.50    Years: 65.00    Pack years: 32.50    Types: Cigarettes  . Smokeless tobacco: Former Systems developer    Types: Chew  . Tobacco comment: "stopped chewing in the 1970's"  Substance and Sexual Activity  . Alcohol use: Yes    Alcohol/week: 6.0 standard drinks    Types: 6 Cans of beer per week  . Drug use: No  . Sexual activity: Not Currently  Other Topics Concern  . Not on file  Social History Narrative  . Not on file   Social Determinants of Health   Financial Resource Strain:   . Difficulty of Paying Living Expenses:   Food Insecurity:   . Worried About Charity fundraiser in the Last Year:   . Arboriculturist in the Last Year:   Transportation Needs:   . Film/video editor (Medical):   Marland Kitchen Lack of Transportation (Non-Medical):   Physical Activity:   . Days of Exercise per Week:   . Minutes of Exercise per Session:   Stress:   . Feeling of Stress :   Social Connections:   . Frequency of Communication with Friends and Family:   . Frequency of Social Gatherings with Friends and Family:   . Attends  Religious Services:   . Active Member of Clubs or Organizations:   . Attends Archivist Meetings:   Marland Kitchen Marital Status:     Activities of Daily Living In your present state of health, do you have any difficulty performing the following activities: 12/05/2019  Hearing? N  Vision? N  Difficulty concentrating or making decisions? N  Walking or climbing stairs? N  Dressing or bathing? N  Doing errands, shopping? N  Preparing Food and eating ? N  Using the Toilet? N  In the past six months, have you accidently leaked urine? N  Do  you have problems with loss of bowel control? N  Managing your Medications? N  Managing your Finances? N  Housekeeping or managing your Housekeeping? N  Some recent data might be hidden    Patient Education/ Literacy How often do you need to have someone help you when you read instructions, pamphlets, or other written materials from your doctor or pharmacy?: 1 - Never What is the last grade level you completed in school?: 12  Exercise Current Exercise Habits: The patient does not participate in regular exercise at present, Exercise limited by: None identified  Diet Patient reports consuming 2 meals a day and 2 snack(s) a day Patient reports that his primary diet is: Diabetic Patient reports that she does have regular access to food.   Depression Screen PHQ 2/9 Scores 12/05/2019 12/04/2019 09/02/2019 05/30/2019  PHQ - 2 Score 0 0 0 0  PHQ- 9 Score - 0 0 0     Fall Risk Fall Risk  12/05/2019 12/04/2019 09/02/2019 05/30/2019  Falls in the past year? 0 0 0 1  Number falls in past yr: - - - 0  Injury with Fall? - - - 0     Objective:  Philip Ford seemed alert and oriented and he participated appropriately during our telephone visit.  Blood Pressure Weight BMI  BP Readings from Last 3 Encounters:  12/04/19 138/82  09/02/19 133/80  05/30/19 125/62   Wt Readings from Last 3 Encounters:  12/04/19 177 lb (80.3 kg)  09/02/19 191 lb (86.6 kg)    05/30/19 194 lb (88 kg)   BMI Readings from Last 1 Encounters:  12/04/19 24.69 kg/m    *Unable to obtain current vital signs, weight, and BMI due to telephone visit type  Hearing/Vision  . Kamal did not seem to have difficulty with hearing/understanding during the telephone conversation . Reports that he has had a formal eye exam by an eye care professional within the past year . Reports that he has not had a formal hearing evaluation within the past year *Unable to fully assess hearing and vision during telephone visit type  Cognitive Function: 6CIT Screen 12/05/2019  What Year? 0 points  What month? 0 points  What time? 0 points  Count back from 20 0 points  Months in reverse 4 points  Repeat phrase 0 points  Total Score 4   (Normal:0-7, Significant for Dysfunction: >8)  Normal Cognitive Function Screening: Yes   Immunization & Health Maintenance Record Immunization History  Administered Date(s) Administered  . Fluad Quad(high Dose 65+) 05/30/2019  . Moderna SARS-COVID-2 Vaccination 10/09/2019, 11/06/2019  . Tdap 09/27/2013    Health Maintenance  Topic Date Due  . OPHTHALMOLOGY EXAM  Never done  . Fecal DNA (Cologuard)  Never done  . PNA vac Low Risk Adult (1 of 2 - PCV13) 09/01/2020 (Originally 03/25/2012)  . INFLUENZA VACCINE  03/15/2020  . FOOT EXAM  05/29/2020  . HEMOGLOBIN A1C  06/04/2020  . TETANUS/TDAP  09/28/2023  . COVID-19 Vaccine  Completed  . Hepatitis C Screening  Completed       Assessment  This is a routine wellness examination for HARKIRAT OROZCO.  Health Maintenance: Due or Overdue Health Maintenance Due  Topic Date Due  . OPHTHALMOLOGY EXAM  Never done  . Fecal DNA (Cologuard)  Never done    Philip Ford does not need a referral for Community Assistance: Care Management:   no Social Work:    no Prescription Assistance:  no Nutrition/Diabetes Education:  no  Plan:  Personalized Goals Goals Addressed            This Visit's  Progress   . DIET - REDUCE SUGAR INTAKE       Limit sweets and watch diet closer    . Reduce alcohol intake       Doing well and want to continue      Personalized Health Maintenance & Screening Recommendations  Colorectal cancer screening Glaucoma screening Smoking cessation counseling Advanced directives: has NO advanced directive - not interested in additional information  Lung Cancer Screening Recommended: yes (Low Dose CT Chest recommended if Age 71-80 years, 30 pack-year currently smoking OR have quit w/in past 15 years) Hepatitis C Screening recommended: no HIV Screening recommended: no  Advanced Directives: Written information was not prepared per patient's request.  Referrals & Orders No orders of the defined types were placed in this encounter.   Follow-up Plan . Follow-up with Philip Norlander, DO as planned . Diabetic eye exam scheduled for 12/20/19 . Pt reminded to return the cologuard kit . Discussed obtaining a chest CT , pt declines at this time. . covid vaccines complete . Discussed shinges vaccine with pt, undecided at this time. . Declined information about advanced directives. . AVS printed and mailed to pt    I have personally reviewed and noted the following in the patient's chart:   . Medical and social history . Use of alcohol, tobacco or illicit drugs  . Current medications and supplements . Functional ability and status . Nutritional status . Physical activity . Advanced directives . List of other physicians . Hospitalizations, surgeries, and ER visits in previous 12 months . Vitals . Screenings to include cognitive, depression, and falls . Referrals and appointments  In addition, I have reviewed and discussed with Philip Ford certain preventive protocols, quality metrics, and best practice recommendations. A written personalized care plan for preventive services as well as general preventive health recommendations is available and  can be mailed to the patient at his request.      Sharee Holster  [id number]

## 2019-12-05 NOTE — Telephone Encounter (Signed)
Dates noted. 

## 2019-12-09 ENCOUNTER — Other Ambulatory Visit: Payer: Self-pay

## 2019-12-09 DIAGNOSIS — D72829 Elevated white blood cell count, unspecified: Secondary | ICD-10-CM

## 2019-12-18 ENCOUNTER — Other Ambulatory Visit: Payer: Self-pay

## 2019-12-18 ENCOUNTER — Ambulatory Visit (INDEPENDENT_AMBULATORY_CARE_PROVIDER_SITE_OTHER): Payer: Medicare Other | Admitting: *Deleted

## 2019-12-18 VITALS — BP 116/66 | HR 76

## 2019-12-18 DIAGNOSIS — D72829 Elevated white blood cell count, unspecified: Secondary | ICD-10-CM

## 2019-12-18 DIAGNOSIS — I10 Essential (primary) hypertension: Secondary | ICD-10-CM

## 2019-12-18 DIAGNOSIS — E1159 Type 2 diabetes mellitus with other circulatory complications: Secondary | ICD-10-CM

## 2019-12-18 NOTE — Progress Notes (Signed)
Patient in today for BP check. First reading 96/52, second reading 116/66.

## 2019-12-18 NOTE — Addendum Note (Signed)
Addended by: Prescott Gum on: 12/18/2019 11:52 AM   Modules accepted: Orders

## 2019-12-19 DIAGNOSIS — Z1211 Encounter for screening for malignant neoplasm of colon: Secondary | ICD-10-CM | POA: Diagnosis not present

## 2019-12-19 DIAGNOSIS — Z1212 Encounter for screening for malignant neoplasm of rectum: Secondary | ICD-10-CM | POA: Diagnosis not present

## 2019-12-19 LAB — CBC WITH DIFFERENTIAL/PLATELET
Basophils Absolute: 0.1 10*3/uL (ref 0.0–0.2)
Basos: 1 %
EOS (ABSOLUTE): 0.5 10*3/uL — ABNORMAL HIGH (ref 0.0–0.4)
Eos: 5 %
Hematocrit: 39.4 % (ref 37.5–51.0)
Hemoglobin: 13.4 g/dL (ref 13.0–17.7)
Immature Grans (Abs): 0 10*3/uL (ref 0.0–0.1)
Immature Granulocytes: 0 %
Lymphocytes Absolute: 2.4 10*3/uL (ref 0.7–3.1)
Lymphs: 25 %
MCH: 31.2 pg (ref 26.6–33.0)
MCHC: 34 g/dL (ref 31.5–35.7)
MCV: 92 fL (ref 79–97)
Monocytes Absolute: 1.2 10*3/uL — ABNORMAL HIGH (ref 0.1–0.9)
Monocytes: 13 %
Neutrophils Absolute: 5.3 10*3/uL (ref 1.4–7.0)
Neutrophils: 56 %
Platelets: 219 10*3/uL (ref 150–450)
RBC: 4.29 x10E6/uL (ref 4.14–5.80)
RDW: 13.2 % (ref 11.6–15.4)
WBC: 9.5 10*3/uL (ref 3.4–10.8)

## 2019-12-29 ENCOUNTER — Other Ambulatory Visit: Payer: Self-pay | Admitting: Family Medicine

## 2019-12-30 ENCOUNTER — Other Ambulatory Visit: Payer: Self-pay | Admitting: Cardiology

## 2020-01-02 ENCOUNTER — Other Ambulatory Visit: Payer: Self-pay

## 2020-01-02 ENCOUNTER — Encounter: Payer: Self-pay | Admitting: Cardiology

## 2020-01-02 ENCOUNTER — Ambulatory Visit: Payer: Medicare Other | Admitting: Cardiology

## 2020-01-02 VITALS — BP 128/70 | HR 75 | Ht 71.0 in | Wt 176.0 lb

## 2020-01-02 DIAGNOSIS — I25119 Atherosclerotic heart disease of native coronary artery with unspecified angina pectoris: Secondary | ICD-10-CM | POA: Diagnosis not present

## 2020-01-02 DIAGNOSIS — K219 Gastro-esophageal reflux disease without esophagitis: Secondary | ICD-10-CM | POA: Diagnosis not present

## 2020-01-02 DIAGNOSIS — E782 Mixed hyperlipidemia: Secondary | ICD-10-CM

## 2020-01-02 DIAGNOSIS — T466X5A Adverse effect of antihyperlipidemic and antiarteriosclerotic drugs, initial encounter: Secondary | ICD-10-CM | POA: Diagnosis not present

## 2020-01-02 DIAGNOSIS — G720 Drug-induced myopathy: Secondary | ICD-10-CM | POA: Diagnosis not present

## 2020-01-02 DIAGNOSIS — I739 Peripheral vascular disease, unspecified: Secondary | ICD-10-CM

## 2020-01-02 LAB — COLOGUARD: Cologuard: NEGATIVE

## 2020-01-02 MED ORDER — NITROGLYCERIN 0.4 MG SL SUBL: 0.4000 mg | SUBLINGUAL_TABLET | SUBLINGUAL | 3 refills | Status: DC | PRN

## 2020-01-02 NOTE — Patient Instructions (Addendum)
Medication Instructions:    Your physician recommends that you continue on your current medications as directed. Please refer to the Current Medication list given to you today.  Labwork:  NONE  Testing/Procedures:  NONE  Follow-Up:  Your physician recommends that you schedule a follow-up appointment in: 6 months (office)  Any Other Special Instructions Will Be Listed Below (If Applicable).  If you need a refill on your cardiac medications before your next appointment, please call your pharmacy. 

## 2020-01-02 NOTE — Progress Notes (Signed)
Cardiology Office Note  Date: 01/02/2020   ID: Philip Ford, Philip Ford [birth year], MRN [id number]  PCP:  [name], DO  Cardiologist:  Rozann Lesches, MD Electrophysiologist:  None   Chief Complaint  Patient presents with  . Cardiac follow-up    History of Present Illness: Philip Ford is a 74 y.o. male last seen in October 2020.  He presents today for a follow-up visit.  We discussed symptoms that he had in the interim.  He states that he was experiencing reflux symptoms and belching earlier this year, Protonix dose was doubled by his PCP.  He was also having trouble with dysphagia and chest pain that would mainly occur at nighttime, he would have to sit up and belch for the symptoms to go away.  These were not exertional symptoms.  He did notice that if he took nitroglycerin it would make the symptoms better along with belching.  This went on for about 6 weeks and then the symptoms have completely resolved.  He is back on Protonix once daily and has not had to take any nitroglycerin regularly with activities.  I reviewed his cardiac regimen which is otherwise stable and outlined below.  He did have a lipid panel done in April, LDL was down to 115 on Zetia.  He has statin intolerance and has not wanted to consider PCSK9 inhibitors so far.  He is trying to lose weight through diet.  I personally reviewed his ECG today which shows normal sinus rhythm with nonspecific T wave changes.  Past Medical History:  Diagnosis Date  . Coronary artery disease    BMS to [place], DES to mid LAD 06/2015  . Essential hypertension   . GERD (gastroesophageal reflux disease)   . History of Helicobacter pylori infection   . History of skin cancer   . Hyperlipidemia   . Statin intolerance   . Type II diabetes mellitus ([place])   . Vitamin D deficiency     Past Surgical History:  Procedure Laterality Date  . CARDIAC CATHETERIZATION N/A 06/17/2015   Procedure: Left Heart Cath and Coronary  Angiography;  Surgeon: Sherren Mocha, MD;  Location: [facility];  Service: Cardiovascular;  Laterality: N/A;  . [place]; 06/17/2015   "2 + 1"  . HERNIA REPAIR    . TONSILLECTOMY AND ADENOIDECTOMY  ~ 1954  . UMBILICAL HERNIA REPAIR  "?early 2000's"    Current Outpatient Medications  Medication Sig Dispense Refill  . Ascorbic Acid (VITAMIN C) 1000 MG tablet Take 1,000 mg by mouth daily.    Marland Kitchen aspirin EC 81 MG tablet Take 81 mg by mouth daily.    . clopidogrel (PLAVIX) 75 MG tablet TAKE 1 TABLET BY MOUTH  DAILY 90 tablet 1  . ezetimibe (ZETIA) 10 MG tablet Take 1 tablet (10 mg total) by mouth daily. 90 tablet 3  . gemfibrozil (LOPID) 600 MG tablet Take 1 tablet (600 mg total) by mouth 2 (two) times daily before a meal. 180 tablet 3  . isosorbide mononitrate (IMDUR) 30 MG 24 hr tablet TAKE 1 TABLET BY MOUTH  DAILY 90 tablet 3  . lisinopril (PRINIVIL,ZESTRIL) 10 MG tablet Take 10 mg by mouth daily.     . metoprolol succinate (TOPROL-XL) 50 MG 24 hr tablet Take 25 mg by mouth daily. Take with or immediately following a meal.    . Multiple Vitamins-Minerals (CENTRUM SILVER PO) Take 1 tablet by mouth daily.     Marland Kitchen  nitroGLYCERIN (NITROSTAT) 0.4 MG SL tablet Place 1 tablet (0.4 mg total) under the tongue every 5 (five) minutes x 3 doses as needed for chest pain (if no relief after 3rd dose, proceed to the ED for an evaluation or call 911). 25 tablet 3  . Omega-3 Fatty Acids (FISH OIL OMEGA-3 PO) 2 (two) times daily. Take one tab by mouth daily - 2000mg      . pantoprazole (PROTONIX) 40 MG tablet TAKE 1 TABLET BY MOUTH  TWICE DAILY 180 tablet 0  . SitaGLIPtin-MetFORMIN HCl (JANUMET XR) 50-1000 MG TB24 Take 1 tablet by mouth every morning. 90 tablet 3  . tamsulosin (FLOMAX) 0.4 MG CAPS capsule Take 1 capsule (0.4 mg total) by mouth daily. 30 capsule 3   No current facility-administered medications for this visit.   Allergies:  Patient has no known  allergies.   ROS:   No palpitations or syncope.  Physical Exam: VS:  BP 128/70   Pulse 75   Ht 5\' 11"  (1.803 m)   Wt 176 lb (79.8 kg)   SpO2 98%   BMI 24.55 kg/m , BMI Body mass index is 24.55 kg/m.  Wt Readings from Last 3 Encounters:  01/02/20 176 lb (79.8 kg)  12/04/19 177 lb (80.3 kg)  09/02/19 191 lb (86.6 kg)    General: Patient appears comfortable at rest. HEENT: Conjunctiva and lids normal, wearing a mask. Neck: Supple, no elevated JVP or carotid bruits, no thyromegaly. Lungs: Clear to auscultation, nonlabored breathing at rest. Cardiac: Regular rate and rhythm, no S3 or significant systolic murmur, no pericardial rub. Abdomen: Soft, bowel sounds present, no guarding or rebound. Extremities: No pitting edema, distal pulses 2+.  ECG:  An ECG dated 08/02/2018 was personally reviewed today and demonstrated:  Sinus rhythm.  Recent Labwork: 12/04/2019: ALT 10; AST 17; BUN 18; Creatinine, Ser 1.21; Potassium 4.3; Sodium 140; TSH 1.820 12/18/2019: Hemoglobin 13.4; Platelets 219     Component Value Date/Time   CHOL 177 12/04/2019 0804   TRIG 177 (H) 12/04/2019 0804   HDL 30 (L) 12/04/2019 0804   CHOLHDL 5.9 (H) 12/04/2019 0804   LDLCALC 115 (H) 12/04/2019 0804   LDLDIRECT 122 (H) 09/02/2019 0808    Other Studies Reviewed Today:  Cardiac catheterization 06/17/2015:  Prox RCA lesion, 100% stenosed.  LM lesion, 30% stenosed.  1st Mrg lesion, 50% stenosed. The lesion was previously treated with a stent (unknown type)greater than two years ago.  Prox Cx lesion, 50% stenosed.  Mid LAD lesion, 95% stenosed. Post intervention, there is a 0% residual stenosis.  1. Patency of the left mainstem with minor nonobstructive stenosis 2. Severe stenosis of the mid LAD, treated successfully with PCI using a single drug-eluting stents 3. Moderate in-stent restenosis in the left circumflex involving the first obtuse marginal branch 4. Chronic total occlusion of the proximal RCA  with right to right collaterals noted  Lexiscan Myoview 08/20/2018:  There was no ST segment deviation noted during stress.  Findings consistent with prior large inferior myocardial infarction with mild to moderate peri-infarct ischemia. There is significant adjacent gut radiotracer uptake that may affect findings,  The left ventricular ejection fraction is mildly decreased (45-54%).  This is an intermediate risk study. Risk based on mildly decreased LVEF and mild to moderate peri-infarct ischemia.  Lower extremity ABIs 10/25/2018: Summary: Right: 50-74% stenosis noted in the superficial femoral artery. 30-49% stenosis noted in the popliteal artery. 30-49% stenosis noted in the anterior tibial artery. ABIs are unreliable.  Left: 30-49% stenosis noted  in the popliteal artery. 75-99% stenosis noted in the anterior tibial artery. Total occlusion noted in the posterior tibial artery. 50-74% stenosis noted in the peroneal artery.  Assessment and Plan:  1.  CAD status post DES to the LAD in 2015 with occlusion of the RCA associated with collaterals and moderate in-stent restenosis involving the circumflex/obtuse marginal system.  We have continued medical therapy although have discussed cardiac catheterization if angina symptoms escalate.  Follow-up Myoview last year showed relatively large infarct scar with mild to moderate peri-infarct ischemia.  ECG reviewed today and stable.  Continue aspirin, Plavix, Zetia, Imdur, lisinopril, Toprol-XL, and as needed nitroglycerin.  2.  GERD and possible esophageal spasm/stricture based on symptoms discussed above.  At present he is stable on Protonix.  May need to consider GI consultation if symptoms worsen.  Keep follow-up with PCP.  3.  Mixed hyperlipidemia with statin intolerance.  Recent LDL 115 on Zetia.  He has not wanted to pursue PCSK9 inhibitors.  Medication Adjustments/Labs and Tests Ordered: Current medicines are reviewed at length with the  patient today.  Concerns regarding medicines are outlined above.   Tests Ordered: Orders Placed This Encounter  Procedures  . EKG 12-Lead    Medication Changes: Meds ordered this encounter  Medications  . nitroGLYCERIN (NITROSTAT) 0.4 MG SL tablet    Sig: Place 1 tablet (0.4 mg total) under the tongue every 5 (five) minutes x 3 doses as needed for chest pain (if no relief after 3rd dose, proceed to the ED for an evaluation or call 911).    Dispense:  25 tablet    Refill:  3    This prescription was filled on 11/14/2019. Any refills authorized will be placed on file.    Disposition:  Follow up 6 months in the [place] office.  Signed, Jonelle Sidle, MD, [facility] 01/02/2020 11:40 AM    [facility] [address] [place], [place], [place] [address] Phone: [phone]; Fax: [phone]

## 2020-01-14 ENCOUNTER — Ambulatory Visit: Payer: Medicare Other | Admitting: Cardiology

## 2020-01-22 DIAGNOSIS — G720 Drug-induced myopathy: Secondary | ICD-10-CM | POA: Insufficient documentation

## 2020-01-22 DIAGNOSIS — T466X5A Adverse effect of antihyperlipidemic and antiarteriosclerotic drugs, initial encounter: Secondary | ICD-10-CM | POA: Insufficient documentation

## 2020-03-04 ENCOUNTER — Telehealth: Payer: Self-pay | Admitting: Pharmacist

## 2020-03-04 NOTE — Telephone Encounter (Signed)
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

## 2020-03-22 ENCOUNTER — Other Ambulatory Visit: Payer: Self-pay | Admitting: Cardiology

## 2020-04-02 ENCOUNTER — Other Ambulatory Visit: Payer: Self-pay | Admitting: *Deleted

## 2020-04-02 MED ORDER — GEMFIBROZIL 600 MG PO TABS: 600.0000 mg | ORAL_TABLET | Freq: Two times a day (BID) | ORAL | 1 refills | Status: DC

## 2020-04-06 ENCOUNTER — Other Ambulatory Visit: Payer: Self-pay

## 2020-04-06 MED ORDER — CLOPIDOGREL BISULFATE 75 MG PO TABS
75.0000 mg | ORAL_TABLET | Freq: Every day | ORAL | 0 refills | Status: DC
Start: 2020-04-06 — End: 2020-06-08

## 2020-05-26 ENCOUNTER — Ambulatory Visit: Payer: Medicare Other | Admitting: Family Medicine

## 2020-05-27 ENCOUNTER — Encounter: Payer: Self-pay | Admitting: Family Medicine

## 2020-05-27 ENCOUNTER — Other Ambulatory Visit: Payer: Self-pay

## 2020-05-27 ENCOUNTER — Ambulatory Visit (INDEPENDENT_AMBULATORY_CARE_PROVIDER_SITE_OTHER): Payer: Medicare Other | Admitting: Family Medicine

## 2020-05-27 VITALS — BP 150/70 | HR 73 | Temp 97.4°F | Ht 71.0 in | Wt 178.2 lb

## 2020-05-27 DIAGNOSIS — E1169 Type 2 diabetes mellitus with other specified complication: Secondary | ICD-10-CM

## 2020-05-27 DIAGNOSIS — E118 Type 2 diabetes mellitus with unspecified complications: Secondary | ICD-10-CM

## 2020-05-27 DIAGNOSIS — I152 Hypertension secondary to endocrine disorders: Secondary | ICD-10-CM

## 2020-05-27 DIAGNOSIS — I25119 Atherosclerotic heart disease of native coronary artery with unspecified angina pectoris: Secondary | ICD-10-CM

## 2020-05-27 DIAGNOSIS — E785 Hyperlipidemia, unspecified: Secondary | ICD-10-CM

## 2020-05-27 DIAGNOSIS — E1159 Type 2 diabetes mellitus with other circulatory complications: Secondary | ICD-10-CM

## 2020-05-27 LAB — LIPID PANEL

## 2020-05-27 LAB — BAYER DCA HB A1C WAIVED: HB A1C (BAYER DCA - WAIVED): 6.1 % (ref <7.0)

## 2020-05-27 MED ORDER — METFORMIN HCL ER 500 MG PO TB24: 1000.0000 mg | ORAL_TABLET | Freq: Every day | ORAL | 1 refills | Status: DC

## 2020-05-27 NOTE — Progress Notes (Signed)
Subjective: CC:DM2 PCP: [name], DO HQI:ONGEXB Viona Gilmore Baby is a 74 y.o. male presenting to clinic today for:  1. Type 2 Diabetes w/ HTN, HLD:  He had been compliant with Janumet XR 50-1000 mg daily up until about 2 weeks when he ran out and found out the cost would be $131 for renewal.  He since switched back over to Metformin.  He continues to keep a lower sugar diet.  No chest pain, shortness of breath, edema, visual disturbance.  Last eye exam: Needs.    Last foot exam: UTD Last A1c: Lab Results  Component Value Date   HGBA1C 6.1 12/04/2019  Nephropathy screen indicated?: on ACEI Last flu, zoster and/or pneumovax: PNA declined Immunization History  Administered Date(s) Administered  . Fluad Quad(high Dose 65+) 05/30/2019  . Moderna SARS-COVID-2 Vaccination 10/09/2019, 11/06/2019  . Tdap 09/27/2013   ROS: Per HPI  Allergies  Allergen Reactions  . Statins     Myopathy    Past Medical History:  Diagnosis Date  . Coronary artery disease    BMS to [place], DES to mid LAD 06/2015  . Essential hypertension   . GERD (gastroesophageal reflux disease)   . History of Helicobacter pylori infection   . History of skin cancer   . Hyperlipidemia   . Statin intolerance   . Type II diabetes mellitus ([place])   . Vitamin D deficiency     Current Outpatient Medications:  .  Ascorbic Acid (VITAMIN C) 1000 MG tablet, Take 1,000 mg by mouth daily., Disp: , Rfl:  .  aspirin EC 81 MG tablet, Take 81 mg by mouth daily., Disp: , Rfl:  .  clopidogrel (PLAVIX) 75 MG tablet, Take 1 tablet (75 mg total) by mouth daily., Disp: 90 tablet, Rfl: 0 .  ezetimibe (ZETIA) 10 MG tablet, TAKE 1 TABLET BY MOUTH  DAILY, Disp: 90 tablet, Rfl: 3 .  gemfibrozil (LOPID) 600 MG tablet, Take 1 tablet (600 mg total) by mouth 2 (two) times daily before a meal., Disp: 180 tablet, Rfl: 1 .  isosorbide mononitrate (IMDUR) 30 MG 24 hr tablet, TAKE 1 TABLET BY MOUTH  DAILY, Disp: 90 tablet, Rfl: 3 .   lisinopril (PRINIVIL,ZESTRIL) 10 MG tablet, Take 10 mg by mouth daily. , Disp: , Rfl:  .  metoprolol succinate (TOPROL-XL) 50 MG 24 hr tablet, Take 25 mg by mouth daily. Take with or immediately following a meal., Disp: , Rfl:  .  Multiple Vitamins-Minerals (CENTRUM SILVER PO), Take 1 tablet by mouth daily. , Disp: , Rfl:  .  nitroGLYCERIN (NITROSTAT) 0.4 MG SL tablet, Place 1 tablet (0.4 mg total) under the tongue every 5 (five) minutes x 3 doses as needed for chest pain (if no relief after 3rd dose, proceed to the ED for an evaluation or call 911)., Disp: 25 tablet, Rfl: 3 .  Omega-3 Fatty Acids (FISH OIL OMEGA-3 PO), 2 (two) times daily. Take one tab by mouth daily - 2021m , Disp: , Rfl:  .  pantoprazole (PROTONIX) 40 MG tablet, TAKE 1 TABLET BY MOUTH  TWICE DAILY, Disp: 180 tablet, Rfl: 0 .  SitaGLIPtin-MetFORMIN HCl (JANUMET XR) 50-1000 MG TB24, Take 1 tablet by mouth every morning., Disp: 90 tablet, Rfl: 3 .  tamsulosin (FLOMAX) 0.4 MG CAPS capsule, Take 1 capsule (0.4 mg total) by mouth daily., Disp: 30 capsule, Rfl: 3 Social History   Socioeconomic History  . Marital status: Married    Spouse name: ann  . Number of children: 0  .  Years of education: 35  . Highest education level: 12th grade  Occupational History  . Not on file  Tobacco Use  . Smoking status: Current Every Day Smoker    Packs/day: 0.50    Years: 65.00    Pack years: 32.50    Types: Cigarettes  . Smokeless tobacco: Former Systems developer    Types: Chew  . Tobacco comment: "stopped chewing in the 1970's"  Vaping Use  . Vaping Use: Never used  Substance and Sexual Activity  . Alcohol use: Yes    Alcohol/week: 6.0 standard drinks    Types: 6 Cans of beer per week  . Drug use: No  . Sexual activity: Not Currently  Other Topics Concern  . Not on file  Social History Narrative  . Not on file   Social Determinants of Health   Financial Resource Strain:   . Difficulty of Paying Living Expenses: Not on file  Food  Insecurity:   . Worried About Charity fundraiser in the Last Year: Not on file  . Ran Out of Food in the Last Year: Not on file  Transportation Needs:   . Lack of Transportation (Medical): Not on file  . Lack of Transportation (Non-Medical): Not on file  Physical Activity:   . Days of Exercise per Week: Not on file  . Minutes of Exercise per Session: Not on file  Stress:   . Feeling of Stress : Not on file  Social Connections:   . Frequency of Communication with Friends and Family: Not on file  . Frequency of Social Gatherings with Friends and Family: Not on file  . Attends Religious Services: Not on file  . Active Member of Clubs or Organizations: Not on file  . Attends Archivist Meetings: Not on file  . Marital Status: Not on file  Intimate Partner Violence:   . Fear of Current or Ex-Partner: Not on file  . Emotionally Abused: Not on file  . Physically Abused: Not on file  . Sexually Abused: Not on file   Family History  Problem Relation Age of Onset  . Heart attack Father   . Heart attack Paternal Aunt   . Heart attack Paternal Uncle   . Healthy Mother     Objective: Office vital signs reviewed. BP (!) 150/70   Pulse 73   Temp (!) 97.4 F (36.3 C)   Ht 5' 11" (1.803 m)   Wt 178 lb 3.2 oz (80.8 kg)   SpO2 97%   BMI 24.85 kg/m   Physical Examination:  General: Awake, alert, well nourished, No acute distress HEENT: left hemostatic bleed above eyebrow, sclera white, MMM Cardio: regular rate and rhythm, S1S2 heard, no murmurs appreciated Pulm: intermittent expiratory wheezes. No rhonchi or rales; normal work of breathing on room air Extremities: warm, well perfused, No edema, cyanosis or clubbing; +2 pulses bilaterally  Assessment/ Plan: 74 y.o. male   1. Controlled type 2 diabetes mellitus with complication, without long-term current use of insulin (HCC) A1c under excellent control at 6.1 today.  He may stop the Janumet, particularly given the  expense.  Take Metformin XR 1000 mg daily.  Recheck in 3 months - Bayer DCA Hb A1c Waived - metFORMIN (GLUCOPHAGE XR) 500 MG 24 hr tablet; Take 2 tablets (1,000 mg total) by mouth daily with breakfast. STOP JANUMET  Dispense: 180 tablet; Refill: 1  2. Hyperlipidemia associated with type 2 diabetes mellitus (HCC) Continue Zetia, gemfibrozil.  Will consider Nexlizet for this patient -  CMP14+EGFR - Lipid Panel  3. Hypertension associated with diabetes ([place]) Not controlled but has not taken any of his medicines this morning - CMP14+EGFR  4. Coronary artery disease involving native coronary artery of native heart with angina pectoris ([place]) - CMP14+EGFR - Lipid Panel   Orders Placed This Encounter  Procedures  . CMP14+EGFR  . Lipid Panel  . Bayer DCA Hb A1c Waived   No orders of the defined types were placed in this encounter.    [name], DO [place] [phone]

## 2020-05-28 ENCOUNTER — Telehealth: Payer: Self-pay | Admitting: Family Medicine

## 2020-05-28 LAB — CMP14+EGFR
ALT: 10 IU/L (ref 0–44)
AST: 15 IU/L (ref 0–40)
Albumin/Globulin Ratio: 1.5 (ref 1.2–2.2)
Albumin: 4.5 g/dL (ref 3.7–4.7)
Alkaline Phosphatase: 71 IU/L (ref 44–121)
BUN/Creatinine Ratio: 13 (ref 10–24)
BUN: 14 mg/dL (ref 8–27)
Bilirubin Total: 0.5 mg/dL (ref 0.0–1.2)
CO2: 23 mmol/L (ref 20–29)
Calcium: 9.5 mg/dL (ref 8.6–10.2)
Chloride: 104 mmol/L (ref 96–106)
Creatinine, Ser: 1.11 mg/dL (ref 0.76–1.27)
GFR calc Af Amer: 76 mL/min/{1.73_m2} (ref >59)
GFR calc non Af Amer: 66 mL/min/{1.73_m2} (ref >59)
Globulin, Total: 3.1 g/dL (ref 1.5–4.5)
Glucose: 107 mg/dL — ABNORMAL HIGH (ref 65–99)
Potassium: 4.7 mmol/L (ref 3.5–5.2)
Sodium: 143 mmol/L (ref 134–144)
Total Protein: 7.6 g/dL (ref 6.0–8.5)

## 2020-05-28 LAB — LIPID PANEL
Chol/HDL Ratio: 5.2 ratio — ABNORMAL HIGH (ref 0.0–5.0)
Cholesterol, Total: 156 mg/dL (ref 100–199)
HDL: 30 mg/dL — ABNORMAL LOW (ref >39)
LDL Chol Calc (NIH): 107 mg/dL — ABNORMAL HIGH (ref 0–99)
Triglycerides: 104 mg/dL (ref 0–149)
VLDL Cholesterol Cal: 19 mg/dL (ref 5–40)

## 2020-05-28 NOTE — Telephone Encounter (Signed)
Attempted to contact patient - NA °

## 2020-05-29 NOTE — Telephone Encounter (Signed)
Pt returned missed call from [facility] regarding lab results. Reviewed results with pt per Dr Nadine Counts note. Pt voiced understanding. Aware that he may be getting call from [facility] regarding Nexitol Rx.

## 2020-06-03 ENCOUNTER — Other Ambulatory Visit: Payer: Self-pay | Admitting: Family Medicine

## 2020-06-03 ENCOUNTER — Other Ambulatory Visit: Payer: Self-pay | Admitting: Cardiology

## 2020-06-03 DIAGNOSIS — K219 Gastro-esophageal reflux disease without esophagitis: Secondary | ICD-10-CM

## 2020-06-07 ENCOUNTER — Other Ambulatory Visit: Payer: Self-pay | Admitting: Family Medicine

## 2020-06-09 ENCOUNTER — Other Ambulatory Visit: Payer: Self-pay | Admitting: Family Medicine

## 2020-06-10 ENCOUNTER — Other Ambulatory Visit: Payer: Self-pay | Admitting: Cardiology

## 2020-07-06 NOTE — Progress Notes (Signed)
Cardiology Office Note  Date: 07/07/2020   ID: Philip Ford 12/18/1945, MRN [id number]  PCP:  Raliegh Ip, DO  Cardiologist:  Nona Dell, MD Electrophysiologist:  None   Chief Complaint  Patient presents with  . Cardiac follow-up    History of Present Illness: Philip Ford is a 74 y.o. male last seen in May.  He presents for a routine visit.  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  He does report intermittent angina, resolves with rest or single nitroglycerin.  This has not been progressive.  We discussed smoking cessation today, he is trying to cut back but has not been able to quit.  Recent LDL was down to 107.  He has a history of statin intolerance due to myalgias, has been on Zetia.  He has not wanted to try PCSK9 inhibitors.  I reviewed his medications, he ran out of Toprol-XL 25 mg daily which we will resume.  Past Medical History:  Diagnosis Date  . Coronary artery disease    BMS to OM1 1999, DES to mid LAD 06/2015  . Essential hypertension   . GERD (gastroesophageal reflux disease)   . History of Helicobacter pylori infection   . History of skin cancer   . Hyperlipidemia   . Statin intolerance   . Type II diabetes mellitus (HCC)   . Vitamin D deficiency     Past Surgical History:  Procedure Laterality Date  . CARDIAC CATHETERIZATION N/A 06/17/2015   Procedure: Left Heart Cath and Coronary Angiography;  Surgeon: Tonny Bollman, MD;  Location: [facility] INVASIVE CV LAB;  Service: Cardiovascular;  Laterality: N/A;  . CORONARY ANGIOPLASTY WITH STENT PLACEMENT  1999; 06/17/2015   "2 + 1"  . HERNIA REPAIR    . TONSILLECTOMY AND ADENOIDECTOMY  ~ 1954  . UMBILICAL HERNIA REPAIR  "?early 2000's"    Current Outpatient Medications  Medication Sig Dispense Refill  . Ascorbic Acid (VITAMIN C) 1000 MG tablet Take 1,000 mg by mouth daily.    Marland Kitchen aspirin EC 81  MG tablet Take 81 mg by mouth daily.    . clopidogrel (PLAVIX) 75 MG tablet TAKE 1 TABLET BY MOUTH  DAILY 90 tablet 0  . ezetimibe (ZETIA) 10 MG tablet TAKE 1 TABLET BY MOUTH  DAILY 90 tablet 3  . gemfibrozil (LOPID) 600 MG tablet Take 1 tablet (600 mg total) by mouth 2 (two) times daily before a meal. 180 tablet 1  . isosorbide mononitrate (IMDUR) 30 MG 24 hr tablet TAKE 1 TABLET BY MOUTH  DAILY 90 tablet 3  . lisinopril (PRINIVIL,ZESTRIL) 10 MG tablet Take 10 mg by mouth daily.     . metFORMIN (GLUCOPHAGE XR) 500 MG 24 hr tablet Take 2 tablets (1,000 mg total) by mouth daily with breakfast. STOP JANUMET 180 tablet 1  . Multiple Vitamins-Minerals (CENTRUM SILVER PO) Take 1 tablet by mouth daily.     . nitroGLYCERIN (NITROSTAT) 0.4 MG SL tablet DISSOLVE 1 TABLET UNDER THE TONGUE EVERY 5 MINUTES AS NEEDED FOR CHEST PAIN. DO NOT EXCEED A TOTAL OF 3 DOSES IN 15 MINUTES. 25 tablet 3  . Omega-3 Fatty Acids (FISH OIL OMEGA-3 PO) 2 (two) times daily. Take one tab by mouth daily - 2000mg      . pantoprazole (PROTONIX) 40 MG tablet TAKE 1 TABLET BY MOUTH  TWICE DAILY 180 tablet 0  . tamsulosin (FLOMAX) 0.4 MG CAPS  capsule Take 1 capsule (0.4 mg total) by mouth daily. 30 capsule 3  . metoprolol succinate (TOPROL XL) 25 MG 24 hr tablet Take 1 tablet (25 mg total) by mouth daily. 90 tablet 2   No current facility-administered medications for this visit.   Allergies:  Statins   ROS: No palpitations or syncope.  Physical Exam: VS:  BP 138/84   Pulse 86   Ht 5' 11.5" (1.816 m)   Wt 176 lb (79.8 kg)   SpO2 99%   BMI 24.20 kg/m , BMI Body mass index is 24.2 kg/m.  Wt Readings from Last 3 Encounters:  07/07/20 176 lb (79.8 kg)  05/27/20 178 lb 3.2 oz (80.8 kg)  01/02/20 176 lb (79.8 kg)    General: Patient appears comfortable at rest. HEENT: Conjunctiva and lids normal, wearing a mask. Neck: Supple, no elevated JVP or carotid bruits, no thyromegaly. Lungs: Clear to auscultation, nonlabored  breathing at rest. Cardiac: Regular rate and rhythm, no S3, soft systolic murmur, no pericardial rub. Extremities: No pitting edema.  ECG:  An ECG dated 01/02/2020 was personally reviewed today and demonstrated:  Sinus rhythm with nonspecific T wave changes.  Recent Labwork: 12/04/2019: TSH 1.820 12/18/2019: Hemoglobin 13.4; Platelets 219 05/27/2020: ALT 10; AST 15; BUN 14; Creatinine, Ser 1.11; Potassium 4.7; Sodium 143     Component Value Date/Time   CHOL 156 05/27/2020 1051   TRIG 104 05/27/2020 1051   HDL 30 (L) 05/27/2020 1051   CHOLHDL 5.2 (H) 05/27/2020 1051   LDLCALC 107 (H) 05/27/2020 1051   LDLDIRECT 122 (H) 09/02/2019 2831    Other Studies Reviewed Today:  Cardiac catheterization 06/17/2015:  Prox RCA lesion, 100% stenosed.  LM lesion, 30% stenosed.  1st Mrg lesion, 50% stenosed. The lesion was previously treated with a stent (unknown type)greater than two years ago.  Prox Cx lesion, 50% stenosed.  Mid LAD lesion, 95% stenosed. Post intervention, there is a 0% residual stenosis.  1. Patency of the left mainstem with minor nonobstructive stenosis 2. Severe stenosis of the mid LAD, treated successfully with PCI using a single drug-eluting stents 3. Moderate in-stent restenosis in the left circumflex involving the first obtuse marginal branch 4. Chronic total occlusion of the proximal RCA with right to right collaterals noted  Lexiscan Myoview 08/20/2018:  There was no ST segment deviation noted during stress.  Findings consistent with prior large inferior myocardial infarction with mild to moderate peri-infarct ischemia. There is significant adjacent gut radiotracer uptake that may affect findings,  The left ventricular ejection fraction is mildly decreased (45-54%).  This is an intermediate risk study. Risk based on mildly decreased LVEF and mild to moderate peri-infarct ischemia.  Lower extremity ABIs 10/25/2018: Summary: Right: 50-74% stenosis noted in the  superficial femoral artery. 30-49% stenosis noted in the popliteal artery. 30-49% stenosis noted in the anterior tibial artery. ABIs are unreliable.  Left: 30-49% stenosis noted in the popliteal artery. 75-99% stenosis noted in the anterior tibial artery. Total occlusion noted in the posterior tibial artery. 50-74% stenosis noted in the peroneal artery.  Assessment and Plan:  1.  CAD status post DES to the LAD in 2015 with known occlusion of the RCA associated with collaterals and moderate in-stent restenosis of the circumflex/OM system that has been managed medically.  He has stable angina symptoms on medical regimen including aspirin, Plavix, Zetia, Imdur, lisinopril, and as needed nitroglycerin.  We are resuming Toprol-XL 25 mg daily as well.  2.  Mixed hyperlipidemia with statin intolerance.  He is on Zetia and does not want to pursue PCSK9 inhibitors.  Recent LDL 107.   Medication Adjustments/Labs and Tests Ordered: Current medicines are reviewed at length with the patient today.  Concerns regarding medicines are outlined above.   Tests Ordered: No orders of the defined types were placed in this encounter.   Medication Changes: Meds ordered this encounter  Medications  . metoprolol succinate (TOPROL XL) 25 MG 24 hr tablet    Sig: Take 1 tablet (25 mg total) by mouth daily.    Dispense:  90 tablet    Refill:  2    Disposition:  Follow up 6 months in the [place] office.  Signed, Jonelle Sidle, MD, [facility] 07/07/2020 9:42 AM    [facility] [address] [place], [place], [place] [address] Phone: [phone]; Fax: [phone]

## 2020-07-07 ENCOUNTER — Ambulatory Visit: Payer: Medicare Other | Admitting: Cardiology

## 2020-07-07 ENCOUNTER — Other Ambulatory Visit: Payer: Self-pay

## 2020-07-07 ENCOUNTER — Encounter: Payer: Self-pay | Admitting: Cardiology

## 2020-07-07 VITALS — BP 138/84 | HR 86 | Ht 71.5 in | Wt 176.0 lb

## 2020-07-07 DIAGNOSIS — I25119 Atherosclerotic heart disease of native coronary artery with unspecified angina pectoris: Secondary | ICD-10-CM | POA: Diagnosis not present

## 2020-07-07 DIAGNOSIS — E782 Mixed hyperlipidemia: Secondary | ICD-10-CM | POA: Diagnosis not present

## 2020-07-07 MED ORDER — METOPROLOL SUCCINATE ER 25 MG PO TB24: 25.0000 mg | ORAL_TABLET | Freq: Every day | ORAL | 2 refills | Status: AC

## 2020-07-07 NOTE — Patient Instructions (Addendum)
Medication Instructions:   Your physician recommends that you continue on your current medications as directed. Please refer to the Current Medication list given to you today.  Restart metoprolol succinate 25 mg daily-please request refills if needed  Labwork:  None  Testing/Procedures:  None  Follow-Up:  Your physician recommends that you schedule a follow-up appointment in: 6 month.  Any Other Special Instructions Will Be Listed Below (If Applicable).  If you need a refill on your cardiac medications before your next appointment, please call your pharmacy.

## 2020-07-22 ENCOUNTER — Ambulatory Visit: Payer: Medicare Other

## 2020-08-05 ENCOUNTER — Other Ambulatory Visit: Payer: Self-pay | Admitting: Cardiology

## 2020-08-10 ENCOUNTER — Other Ambulatory Visit: Payer: Self-pay | Admitting: Cardiology

## 2020-08-10 ENCOUNTER — Other Ambulatory Visit: Payer: Self-pay | Admitting: Family Medicine

## 2020-08-10 DIAGNOSIS — K219 Gastro-esophageal reflux disease without esophagitis: Secondary | ICD-10-CM

## 2020-08-28 ENCOUNTER — Ambulatory Visit: Payer: Medicare Other | Admitting: Family Medicine

## 2020-09-22 ENCOUNTER — Other Ambulatory Visit: Payer: Self-pay

## 2020-09-22 ENCOUNTER — Encounter: Payer: Self-pay | Admitting: Family Medicine

## 2020-09-22 ENCOUNTER — Ambulatory Visit (INDEPENDENT_AMBULATORY_CARE_PROVIDER_SITE_OTHER): Payer: Medicare Other | Admitting: Family Medicine

## 2020-09-22 VITALS — BP 115/59 | HR 74 | Temp 97.9°F | Ht 71.5 in | Wt 174.0 lb

## 2020-09-22 DIAGNOSIS — I25119 Atherosclerotic heart disease of native coronary artery with unspecified angina pectoris: Secondary | ICD-10-CM | POA: Diagnosis not present

## 2020-09-22 DIAGNOSIS — E785 Hyperlipidemia, unspecified: Secondary | ICD-10-CM | POA: Diagnosis not present

## 2020-09-22 DIAGNOSIS — E1169 Type 2 diabetes mellitus with other specified complication: Secondary | ICD-10-CM | POA: Diagnosis not present

## 2020-09-22 DIAGNOSIS — G720 Drug-induced myopathy: Secondary | ICD-10-CM

## 2020-09-22 DIAGNOSIS — I152 Hypertension secondary to endocrine disorders: Secondary | ICD-10-CM

## 2020-09-22 DIAGNOSIS — T466X5A Adverse effect of antihyperlipidemic and antiarteriosclerotic drugs, initial encounter: Secondary | ICD-10-CM

## 2020-09-22 DIAGNOSIS — E1159 Type 2 diabetes mellitus with other circulatory complications: Secondary | ICD-10-CM

## 2020-09-22 DIAGNOSIS — E118 Type 2 diabetes mellitus with unspecified complications: Secondary | ICD-10-CM | POA: Diagnosis not present

## 2020-09-22 LAB — BAYER DCA HB A1C WAIVED: HB A1C (BAYER DCA - WAIVED): 5.5 % (ref <7.0)

## 2020-09-22 MED ORDER — NEXLIZET 180-10 MG PO TABS
1.0000 | ORAL_TABLET | Freq: Every day | ORAL | 2 refills | Status: DC
Start: 2020-09-22 — End: 2020-09-30

## 2020-09-22 MED ORDER — METFORMIN HCL ER 500 MG PO TB24: 500.0000 mg | ORAL_TABLET | Freq: Every day | ORAL | 3 refills | Status: DC

## 2020-09-22 NOTE — Progress Notes (Signed)
Subjective: CC: DM, CAD PCP: Philip Ip, Philip Ford Philip Ford is a 75 y.o. male presenting to clinic today for:  1. Type 2 Diabetes with hypertension, hyperlipidemia, CAD:  Compliant with all medications.  Last LDL was noted to be down to 107.  Has had significant intolerance to statins in the past.  Repatha injection was offered by cardiologist previously but patient has declined this.   He is compliant with his Metformin 1000 mg daily, Zetia and gemfibrozil.  He did not realize that an LDL of 107 was not considered ideal with his cardiac history.  He would be willing to try Nexletol if it is in fact affordable and tolerable.  No chest pain, shortness of breath, edema.  He has been working on ongoing weight loss but still indulges in things like pumpkin bread with his wife bakes it.  Last eye exam: Up-to-date Last foot exam: Needs Last A1c:  Lab Results  Component Value Date   HGBA1C 6.1 05/27/2020   Nephropathy screen indicated?:  On ACE inhibitor Last flu, zoster and/or pneumovax: Pneumococcal vaccine needed Immunization History  Administered Date(s) Administered  . Fluad Quad(high Dose 65+) 05/30/2019  . Moderna Sars-Covid-2 Vaccination 10/09/2019, 11/06/2019  . Tdap 09/27/2013    ROS: Per HPI  Allergies  Allergen Reactions  . Statins     Myopathy    Past Medical History:  Diagnosis Date  . Coronary artery disease    BMS to OM1 1999, DES to mid LAD 06/2015  . Essential hypertension   . GERD (gastroesophageal reflux disease)   . History of Helicobacter pylori infection   . History of skin cancer   . Hyperlipidemia   . Statin intolerance   . Type II diabetes mellitus (HCC)   . Vitamin D deficiency     Current Outpatient Medications:  .  Ascorbic Acid (VITAMIN C) 1000 MG tablet, Take 1,000 mg by mouth daily., Disp: , Rfl:  .  aspirin EC 81 MG tablet, Take 81 mg by mouth daily., Disp: , Rfl:  .  clopidogrel (PLAVIX) 75 MG tablet, TAKE 1 TABLET BY  MOUTH  DAILY, Disp: 90 tablet, Rfl: 0 .  ezetimibe (ZETIA) 10 MG tablet, TAKE 1 TABLET BY MOUTH  DAILY, Disp: 90 tablet, Rfl: 3 .  gemfibrozil (LOPID) 600 MG tablet, TAKE 1 TABLET BY MOUTH  TWICE DAILY BEFORE A MEAL, Disp: 180 tablet, Rfl: 3 .  isosorbide mononitrate (IMDUR) 30 MG 24 hr tablet, TAKE 1 TABLET BY MOUTH  DAILY, Disp: 90 tablet, Rfl: 3 .  lisinopril (PRINIVIL,ZESTRIL) 10 MG tablet, Take 10 mg by mouth daily. , Disp: , Rfl:  .  metFORMIN (GLUCOPHAGE XR) 500 MG 24 hr tablet, Take 2 tablets (1,000 mg total) by mouth daily with breakfast. STOP JANUMET, Disp: 180 tablet, Rfl: 1 .  metoprolol succinate (TOPROL XL) 25 MG 24 hr tablet, Take 1 tablet (25 mg total) by mouth daily., Disp: 90 tablet, Rfl: 2 .  Multiple Vitamins-Minerals (CENTRUM SILVER PO), Take 1 tablet by mouth daily. , Disp: , Rfl:  .  nitroGLYCERIN (NITROSTAT) 0.4 MG SL tablet, DISSOLVE 1 TABLET UNDER THE TONGUE EVERY 5 MINUTES AS NEEDED FOR CHEST PAIN. Philip Ford NOT EXCEED A TOTAL OF 3 DOSES IN 15 MINUTES., Disp: 25 tablet, Rfl: 3 .  Omega-3 Fatty Acids (FISH OIL OMEGA-3 PO), 2 (two) times daily. Take one tab by mouth daily - 2000mg  , Disp: , Rfl:  .  pantoprazole (PROTONIX) 40 MG tablet, TAKE 1 TABLET BY MOUTH  TWICE DAILY, Disp: 180 tablet, Rfl: 0 .  tamsulosin (FLOMAX) 0.4 MG CAPS capsule, Take 1 capsule (0.4 mg total) by mouth daily., Disp: 30 capsule, Rfl: 3 Social History   Socioeconomic History  . Marital status: Married    Spouse name: ann  . Number of children: 0  . Years of education: 53  . Highest education level: 12th grade  Occupational History  . Not on file  Tobacco Use  . Smoking status: Current Every Day Smoker    Packs/day: 0.50    Years: 65.00    Pack years: 32.50    Types: Cigarettes  . Smokeless tobacco: Former Neurosurgeon    Types: Chew  . Tobacco comment: "stopped chewing in the 1970's"  Vaping Use  . Vaping Use: Never used  Substance and Sexual Activity  . Alcohol use: Yes    Alcohol/week: 6.0  standard drinks    Types: 6 Cans of beer per week  . Drug use: No  . Sexual activity: Not Currently  Other Topics Concern  . Not on file  Social History Narrative  . Not on file   Social Determinants of Health   Financial Resource Strain: Not on file  Food Insecurity: Not on file  Transportation Needs: Not on file  Physical Activity: Not on file  Stress: Not on file  Social Connections: Not on file  Intimate Partner Violence: Not on file   Family History  Problem Relation Age of Onset  . Heart attack Father   . Heart attack Paternal Aunt   . Heart attack Paternal Uncle   . Healthy Mother     Objective: Office vital signs reviewed. BP (!) 115/59   Pulse 74   Temp 97.9 F (36.6 C)   Ht 5' 11.5" (1.816 m)   Wt 174 lb (78.9 kg)   SpO2 99%   BMI 23.93 kg/m   Physical Examination:  General: Awake, alert, well nourished, No acute distress HEENT: Normal, sclera white, MMM Cardio: regular rate and rhythm, S1S2 heard, no murmurs appreciated Pulm: clear to auscultation bilaterally, no wheezes, rhonchi or rales; normal work of breathing on room air Extremities: warm, well perfused, No edema, cyanosis or clubbing; +2 pulses bilaterally MSK: normal gait and station Neuro: see DM foot Diabetic Foot Exam - Simple   Simple Foot Form Diabetic Foot exam was performed with the following findings: Yes 09/22/2020  3:05 PM  Visual Inspection See comments: Yes Sensation Testing Intact to touch and monofilament testing bilaterally: Yes Pulse Check Posterior Tibialis and Dorsalis pulse intact bilaterally: Yes Comments Dry flaking skin but no observed skin breakdown, calluses or ulcerations     Assessment/ Plan: 75 y.o. male   Controlled type 2 diabetes mellitus with complication, without long-term current use of insulin (HCC) - Plan: Bayer DCA Hb A1c Waived, metFORMIN (GLUCOPHAGE XR) 500 MG 24 hr tablet  Hyperlipidemia associated with type 2 diabetes mellitus (HCC) - Plan:  Bempedoic Acid-Ezetimibe (NEXLIZET) 180-10 MG TABS  Statin myopathy  Hypertension associated with diabetes (HCC)  Coronary artery disease involving native coronary artery of native heart with angina pectoris (HCC) - Plan: Bempedoic Acid-Ezetimibe (NEXLIZET) 180-10 MG TABS  Sugars well controlled. He may reduce his Metformin to 1 tablet daily. Plan to recheck A1c in 3 months. If A1c still below 6 okay to discontinue Metformin totally  We will trial Nexlizet given ongoing elevation in LDL.  Advised to discontinue Zetia since this is in the combo pill.  He was not able to tolerate statins due  to myopathy in the past. May need to consider discontinuing Lopid at some point as well but I cannot find a contraindication to utilization of both at this time.  Blood pressures controlled. Continue current regimen   No orders of the defined types were placed in this encounter.  No orders of the defined types were placed in this encounter.    Philip Ip, Philip Ford [facility] [phone]

## 2020-09-22 NOTE — Patient Instructions (Signed)
Nexlizet was prescribed for cholesterol today. (STOP your Zetia/Estimibe while on this medication. Nexlizet HAS Zetia in the pill already.)  REDUCE metformin to ONE (1) tablet daily.  Your sugar was very good today.  No need for 2 tablets of this for now.  We will recheck in 3 months (cholesterol, and sugar) and IF sugar A1c is still less than 6 we can stop Metformin! Philip Ford!

## 2020-09-23 ENCOUNTER — Telehealth: Payer: Self-pay | Admitting: *Deleted

## 2020-09-23 NOTE — Telephone Encounter (Signed)
PA in process   Key: [id number] - PA Case ID: [id number] - Rx #: [id number] help? Call us at [phone] Status Sent to [facility] Nexlizet 180-10MG  tablets

## 2020-09-23 NOTE — Telephone Encounter (Signed)
NEXLIZET TAB 180/10MG , use as directed, is approved for non-formulary exception through 08/14/2021 under your Medicare Part D benefit. Reviewed by: System.   Pharmacy aware.

## 2020-09-30 ENCOUNTER — Other Ambulatory Visit: Payer: Self-pay | Admitting: Family Medicine

## 2020-09-30 MED ORDER — EZETIMIBE 10 MG PO TABS: 10.0000 mg | ORAL_TABLET | Freq: Every day | ORAL | 3 refills | Status: DC

## 2020-10-21 ENCOUNTER — Ambulatory Visit (INDEPENDENT_AMBULATORY_CARE_PROVIDER_SITE_OTHER): Payer: Medicare Other

## 2020-10-21 ENCOUNTER — Ambulatory Visit: Payer: Medicare Other

## 2020-10-21 ENCOUNTER — Other Ambulatory Visit: Payer: Self-pay

## 2020-10-21 DIAGNOSIS — Z23 Encounter for immunization: Secondary | ICD-10-CM | POA: Diagnosis not present

## 2020-10-21 NOTE — Progress Notes (Signed)
   Covid-19 Vaccination Clinic  Name:  NATASHA PAULSON    MRN: [id number] DOB: Jul 13, 1946  10/21/2020  Mr. Dollar was observed post Covid-19 immunization for 15 minutes without incident. He was provided with Vaccine Information Sheet and instruction to access the V-Safe system.   Mr. Fant was instructed to call 911 with any severe reactions post vaccine: Marland Kitchen Difficulty breathing  . Swelling of face and throat  . A fast heartbeat  . A bad rash all over body  . Dizziness and weakness   Immunizations Administered    Name Date Dose VIS Date Route   Moderna Covid-19 Booster Vaccine 10/21/2020 10:05 AM 0.25 mL 06/03/2020 Intramuscular   Manufacturer: Moderna   Lot: [id number]   NDC: 39532-023-34

## 2020-10-22 ENCOUNTER — Other Ambulatory Visit: Payer: Self-pay | Admitting: Family Medicine

## 2020-10-22 DIAGNOSIS — K219 Gastro-esophageal reflux disease without esophagitis: Secondary | ICD-10-CM

## 2020-11-02 ENCOUNTER — Other Ambulatory Visit: Payer: Self-pay | Admitting: *Deleted

## 2020-11-02 MED ORDER — EZETIMIBE 10 MG PO TABS
10.0000 mg | ORAL_TABLET | Freq: Every day | ORAL | 3 refills | Status: AC
Start: 2020-11-02 — End: ?

## 2020-11-04 ENCOUNTER — Other Ambulatory Visit: Payer: Self-pay | Admitting: *Deleted

## 2020-11-04 MED ORDER — LISINOPRIL 10 MG PO TABS: 10.0000 mg | ORAL_TABLET | Freq: Every day | ORAL | 3 refills | Status: AC

## 2020-11-04 MED ORDER — GEMFIBROZIL 600 MG PO TABS: 600.0000 mg | ORAL_TABLET | Freq: Two times a day (BID) | ORAL | 0 refills | Status: AC

## 2020-11-18 ENCOUNTER — Other Ambulatory Visit: Payer: Self-pay | Admitting: Cardiology

## 2020-12-07 ENCOUNTER — Ambulatory Visit (INDEPENDENT_AMBULATORY_CARE_PROVIDER_SITE_OTHER): Payer: Medicare Other

## 2020-12-07 VITALS — Ht 72.0 in | Wt 174.0 lb

## 2020-12-07 DIAGNOSIS — Z0000 Encounter for general adult medical examination without abnormal findings: Secondary | ICD-10-CM

## 2020-12-07 NOTE — Progress Notes (Signed)
Subjective:   Philip Ford is a 75 y.o. male who presents for Medicare Annual/Subsequent preventive examination.  Virtual Visit via Telephone Note  I connected with  Philip Planasonald W Ford on 12/07/20 at  9:00 AM EDT by telephone and verified that I am speaking with the correct person using two identifiers.  Location: Patient: Home Provider: [facility] Persons participating in the virtual visit: patient/Nurse Health Advisor   I discussed the limitations, risks, security and privacy concerns of performing an evaluation and management service by telephone and the availability of in person appointments. The patient expressed understanding and agreed to proceed.  Interactive audio and video telecommunications were attempted between this nurse and patient, however failed, due to patient having technical difficulties OR patient did not have access to video capability.  We continued and completed visit with audio only.  Some vital signs may be absent or patient reported.   Philip Cohenour E Darcell Sabino, LPN   Review of Systems     Cardiac Risk Factors include: advanced age (>2155men, 45>65 women);diabetes mellitus;dyslipidemia;male gender;sedentary lifestyle;smoking/ tobacco exposure     Objective:    Today's Vitals   12/07/20 0906  Weight: 174 lb (78.9 kg)  Height: 6' (1.829 m)   Body mass index is 23.6 kg/m.  Advanced Directives 12/05/2019 06/17/2015 09/28/2013  Does Patient Have a Medical Advance Directive? No No Patient does not have advance directive;Patient would not like information  Would patient like information on creating a medical advance directive? No - Patient declined No - patient declined information -  Pre-existing out of facility DNR order (yellow form or pink MOST form) - - No    Current Medications (verified) Outpatient Encounter Medications as of 12/07/2020  Medication Sig  . Ascorbic Acid (VITAMIN C) 1000 MG tablet Take 1,000 mg by mouth daily.  Marland Kitchen. aspirin EC 81 MG tablet Take 81 mg by  mouth daily.  . clopidogrel (PLAVIX) 75 MG tablet TAKE 1 TABLET BY MOUTH  DAILY  . ezetimibe (ZETIA) 10 MG tablet Take 1 tablet (10 mg total) by mouth daily.  Marland Kitchen. gemfibrozil (LOPID) 600 MG tablet Take 1 tablet (600 mg total) by mouth 2 (two) times daily before a meal.  . isosorbide mononitrate (IMDUR) 30 MG 24 hr tablet TAKE 1 TABLET BY MOUTH  DAILY  . lisinopril (ZESTRIL) 10 MG tablet Take 1 tablet (10 mg total) by mouth daily.  . metFORMIN (GLUCOPHAGE XR) 500 MG 24 hr tablet Take 1 tablet (500 mg total) by mouth daily with breakfast.  . metoprolol succinate (TOPROL XL) 25 MG 24 hr tablet Take 1 tablet (25 mg total) by mouth daily.  . Multiple Vitamins-Minerals (CENTRUM SILVER PO) Take 1 tablet by mouth daily.   . nitroGLYCERIN (NITROSTAT) 0.4 MG SL tablet DISSOLVE 1 TABLET UNDER THE TONGUE EVERY 5 MINUTES AS NEEDED FOR CHEST PAIN. DO NOT EXCEED A TOTAL OF 3 DOSES IN 15 MINUTES.  . Omega-3 Fatty Acids (FISH OIL OMEGA-3 PO) 2 (two) times daily. Take one tab by mouth daily - 2000mg   . pantoprazole (PROTONIX) 40 MG tablet TAKE 1 TABLET BY MOUTH  TWICE DAILY  . tamsulosin (FLOMAX) 0.4 MG CAPS capsule Take 1 capsule (0.4 mg total) by mouth daily.   No facility-administered encounter medications on file as of 12/07/2020.    Allergies (verified) Statins   History: Past Medical History:  Diagnosis Date  . Coronary artery disease    BMS to OM1 1999, DES to mid LAD 06/2015  . Essential hypertension   . GERD (  gastroesophageal reflux disease)   . History of Helicobacter pylori infection   . History of skin cancer   . Hyperlipidemia   . Statin intolerance   . Type II diabetes mellitus (HCC)   . Vitamin D deficiency    Past Surgical History:  Procedure Laterality Date  . CARDIAC CATHETERIZATION N/A 06/17/2015   Procedure: Left Heart Cath and Coronary Angiography;  Surgeon: Tonny Bollman, MD;  Location: [facility] INVASIVE CV LAB;  Service: Cardiovascular;  Laterality: N/A;  . CORONARY ANGIOPLASTY WITH  STENT PLACEMENT  1999; 06/17/2015   "2 + 1"  . HERNIA REPAIR    . TONSILLECTOMY AND ADENOIDECTOMY  ~ 1954  . UMBILICAL HERNIA REPAIR  "?early 2000's"   Family History  Problem Relation Age of Onset  . Heart attack Father   . Heart attack Paternal Aunt   . Heart attack Paternal Uncle   . Healthy Mother    Social History   Socioeconomic History  . Marital status: Married    Spouse name: ann  . Number of children: 0  . Years of education: 41  . Highest education level: 12th grade  Occupational History  . Not on file  Tobacco Use  . Smoking status: Current Every Day Smoker    Packs/day: 0.50    Years: 65.00    Pack years: 32.50    Types: Cigarettes  . Smokeless tobacco: Former Neurosurgeon    Types: Chew  . Tobacco comment: "stopped chewing in the 1970's"  Vaping Use  . Vaping Use: Never used  Substance and Sexual Activity  . Alcohol use: Yes    Alcohol/week: 6.0 standard drinks    Types: 6 Cans of beer per week  . Drug use: No  . Sexual activity: Not Currently  Other Topics Concern  . Not on file  Social History Narrative   Lives with his wife, enjoys gardening and outdoor work, stays busy.   Social Determinants of Health   Financial Resource Strain: Low Risk   . Difficulty of Paying Living Expenses: Not hard at all  Food Insecurity: No Food Insecurity  . Worried About Programme researcher, broadcasting/film/video in the Last Year: Never true  . Ran Out of Food in the Last Year: Never true  Transportation Needs: No Transportation Needs  . Lack of Transportation (Medical): No  . Lack of Transportation (Non-Medical): No  Physical Activity: Insufficiently Active  . Days of Exercise per Week: 7 days  . Minutes of Exercise per Session: 10 min  Stress: No Stress Concern Present  . Feeling of Stress : Not at all  Social Connections: Moderately Isolated  . Frequency of Communication with Friends and Family: More than three times a week  . Frequency of Social Gatherings with Friends and Family: Once a  week  . Attends Religious Services: Never  . Active Member of Clubs or Organizations: No  . Attends Banker Meetings: Never  . Marital Status: Married    Tobacco Counseling Ready to quit: No Counseling given: Yes Comment: "stopped chewing in the 1970's"   Clinical Intake:  Pre-visit preparation completed: Yes  Pain : No/denies pain     BMI - recorded: 23.6 Nutritional Status: BMI of 19-24  Normal Nutritional Risks: None Diabetes: Yes CBG done?: No Did pt. bring in CBG monitor from home?: No  How often do you need to have someone help you when you read instructions, pamphlets, or other written materials from your doctor or pharmacy?: 1 - Never  Nutrition Risk Assessment:  Has the patient had any N/V/D within the last 2 months?  No  Does the patient have any non-healing wounds?  No  Has the patient had any unintentional weight loss or weight gain?  No   Diabetes:  Is the patient diabetic?  Yes  If diabetic, was a CBG obtained today?  No  Did the patient bring in their glucometer from home?  No  How often do you monitor your CBG's? He doesn't check sugar at home.   Financial Strains and Diabetes Management:  Are you having any financial strains with the device, your supplies or your medication? No .  Does the patient want to be seen by Chronic Care Management for management of their diabetes?  No  Would the patient like to be referred to a Nutritionist or for Diabetic Management?  No   Diabetic Exams:  Diabetic Eye Exam: Completed 2022 per patient (will request records). Pt has been advised about the importance in completing this exam. Diabetic Foot Exam: Completed 09/22/20. Pt has been advised about the importance in completing this exam. Pt is scheduled for diabetic foot exam on 12/21/20.    Interpreter Needed?: No  Information entered by :: Takiya Belmares, LPN   Activities of Daily Living In your present state of health, do you have any difficulty  performing the following activities: 12/07/2020  Hearing? N  Vision? N  Difficulty concentrating or making decisions? N  Walking or climbing stairs? Y  Dressing or bathing? N  Doing errands, shopping? N  Preparing Food and eating ? N  Using the Toilet? N  In the past six months, have you accidently leaked urine? N  Do you have problems with loss of bowel control? N  Managing your Medications? N  Managing your Finances? N  Housekeeping or managing your Housekeeping? N  Some recent data might be hidden    Patient Care Team: Raliegh Ip, DO as PCP - General (Family Medicine) Jonelle Sidle, MD as PCP - Cardiology (Cardiology)  Indicate any recent Medical Services you may have received from other than [facility] providers in the past year (date may be approximate).     Assessment:   This is a routine wellness examination for Philip Ford.  Hearing/Vision screen  Hearing Screening   125Hz  250Hz  500Hz  1000Hz  2000Hz  3000Hz  4000Hz  6000Hz  8000Hz   Right ear:           Left ear:           Comments: Declines hearing difficulties  Vision Screening Comments: Declines vision difficulties - Wears glasses only when driving - Annual visits with Dr in [place] - says eye exam is up to date - I will fax records request  Dietary issues and exercise activities discussed: Current Exercise Habits: Home exercise routine, Type of exercise: walking, Time (Minutes): 10, Frequency (Times/Week): 7, Weekly Exercise (Minutes/Week): 70, Intensity: Mild, Exercise limited by: None identified  Goals    . DIET - REDUCE SUGAR INTAKE     Limit sweets and watch diet closer    . Quit Smoking     Patient not ready to quit, but we advise to start weaning himself off of tobacco over this next year to stay healthier and feel better.    . Reduce alcohol intake     Try to limit yourself to 3 drinks per week Goals Addressed             This Visit's Progress   . DIET - REDUCE SUGAR INTAKE  On track    Limit  sweets and watch diet closer    . Quit Smoking       Patient not ready to quit, but we advise to start weaning himself off of tobacco over this next year to stay healthier and feel better.    . Reduce alcohol intake       Try to limit yourself to 3 drinks per week             Depression Screen PHQ 2/9 Scores 12/07/2020 09/22/2020 05/27/2020 12/05/2019 12/04/2019 09/02/2019 05/30/2019  PHQ - 2 Score 0 0 0 0 0 0 0  PHQ- 9 Score - - - - 0 0 0    Fall Risk Fall Risk  12/07/2020 09/22/2020 05/27/2020 12/05/2019 12/04/2019  Falls in the past year? 0 0 0 0 0  Number falls in past yr: 0 - - - -  Injury with Fall? 0 - - - -  Risk for fall due to : No Fall Risks - - - -  Follow up Falls prevention discussed - - - -    FALL RISK PREVENTION PERTAINING TO THE HOME:  Any stairs in or around the home? No  If so, are there any without handrails? No  Home free of loose throw rugs in walkways, pet beds, electrical cords, etc? Yes  Adequate lighting in your home to reduce risk of falls? Yes   ASSISTIVE DEVICES UTILIZED TO PREVENT FALLS:  Life alert? No  Use of a cane, walker or w/c? No  Grab bars in the bathroom? Yes  Shower chair or bench in shower? Yes  Elevated toilet seat or a handicapped toilet? Yes   TIMED UP AND GO:  Was the test performed? No . Telephonic visit  Cognitive Function:Normal cognitive status assessed by direct observation by this Nurse Health Advisor. No abnormalities found.       6CIT Screen 12/05/2019  What Year? 0 points  What month? 0 points  What time? 0 points  Count back from 20 0 points  Months in reverse 4 points  Repeat phrase 0 points  Total Score 4    Immunizations Immunization History  Administered Date(s) Administered  . Fluad Quad(high Dose 65+) 05/30/2019  . Moderna SARS-COV2 Booster Vaccination 10/21/2020  . Moderna Sars-Covid-2 Vaccination 10/09/2019, 11/06/2019  . Tdap 04/15/2010, 09/27/2013    TDAP status: Up to date  Flu Vaccine  status: Due, Education has been provided regarding the importance of this vaccine. Advised may receive this vaccine at local pharmacy or Health Dept. Aware to provide a copy of the vaccination record if obtained from local pharmacy or Health Dept. Verbalized acceptance and understanding.  Pneumococcal vaccine status: Due, Education has been provided regarding the importance of this vaccine. Advised may receive this vaccine at local pharmacy or Health Dept. Aware to provide a copy of the vaccination record if obtained from local pharmacy or Health Dept. Verbalized acceptance and understanding.  Covid-19 vaccine status: Completed vaccines  Qualifies for Shingles Vaccine? Yes   Zostavax completed No   Shingrix Completed?: No.    Education has been provided regarding the importance of this vaccine. Patient has been advised to call insurance company to determine out of pocket expense if they have not yet received this vaccine. Advised may also receive vaccine at local pharmacy or Health Dept. Verbalized acceptance and understanding.  Screening Tests Health Maintenance  Topic Date Due  . OPHTHALMOLOGY EXAM  Never done  . PNA vac Low Risk Adult (1 of  2 - PCV13) 09/22/2021 (Originally 03/25/2012)  . INFLUENZA VACCINE  03/15/2021  . HEMOGLOBIN A1C  03/22/2021  . FOOT EXAM  09/22/2021  . Fecal DNA (Cologuard)  12/19/2022  . TETANUS/TDAP  09/28/2023  . COVID-19 Vaccine  Completed  . Hepatitis C Screening  Completed  . HPV VACCINES  Aged Out    Health Maintenance  Health Maintenance Due  Topic Date Due  . OPHTHALMOLOGY EXAM  Never done    Colorectal cancer screening: Type of screening: Cologuard. Completed 12/19/2019. Repeat every 3 years  Lung Cancer Screening: (Low Dose CT Chest recommended if Age 11-80 years, 30 pack-year currently smoking OR have quit w/in 15years.) does qualify.   Lung Cancer Screening Referral: patient declines at this time  Additional Screening:  Hepatitis C  Screening: does qualify; Completed 08/24/2017  Vision Screening: Recommended annual ophthalmology exams for early detection of glaucoma and other disorders of the eye. Is the patient up to date with their annual eye exam?  Yes  Who is the provider or what is the name of the office in which the patient attends annual eye exams? Dr Conley Rolls in [place]  If pt is not established with a provider, would they like to be referred to a provider to establish care? No .   Dental Screening: Recommended annual dental exams for proper oral hygiene  Community Resource Referral / Chronic Care Management: CRR required this visit?  No   CCM required this visit?  No      Plan:     I have personally reviewed and noted the following in the patient's chart:   . Medical and social history . Use of alcohol, tobacco or illicit drugs  . Current medications and supplements . Functional ability and status . Nutritional status . Physical activity . Advanced directives . List of other physicians . Hospitalizations, surgeries, and ER visits in previous 12 months . Vitals . Screenings to include cognitive, depression, and falls . Referrals and appointments  In addition, I have reviewed and discussed with patient certain preventive protocols, quality metrics, and best practice recommendations. A written personalized care plan for preventive services as well as general preventive health recommendations were provided to patient.     [place] Constable, LPN   [id number]   Nurse Notes: He is due for pnuemonia and shingles vaccines as well as a low dose chest CT lung cancer screening.

## 2020-12-07 NOTE — Patient Instructions (Signed)
Philip Ford , Thank you for taking time to come for your Medicare Wellness Visit. I appreciate your ongoing commitment to your health goals. Please review the following plan we discussed and let me know if I can assist you in the future.   Screening recommendations/referrals: Colonoscopy: Cologuard done 12/19/2019 - repeat every 3 years Recommended yearly ophthalmology/optometry visit for glaucoma screening and checkup Recommended yearly dental visit for hygiene and checkup  Vaccinations: Influenza vaccine: DUE in Fall  Pneumococcal vaccine: DUE Tdap vaccine: Done 09/27/2013 - Repeat every 10 years Shingles vaccine: DUE Shingrix discussed. Please contact your pharmacy for coverage information.    Covid-19: Done 10/09/19, 11/06/19, & 10/21/20  Conditions/risks identified: If you wish to quit smoking, help is available. For free tobacco cessation program offerings call the [facility] at (502) 700-6589 or Live Well Line at 574-719-3175. You may also visit [email or web address] or email [email or web address] for more information on other programs.   You may also call 1-800-QUIT-NOW (254 032 6557) or visit www.[place].ch or www.[facility].org for additional resources on smoking cessation.   Aim for 30 minutes of exercise or brisk walking each day, drink 6-8 glasses of water and eat lots of fruits and vegetables.   Next appointment: Follow up in one year for your annual wellness visit.   Preventive Care 73 Years and Older, Male  Preventive care refers to lifestyle choices and visits with your health care provider that can promote health and wellness. What does preventive care include?  A yearly physical exam. This is also called an annual well check.  Dental exams once or twice a year.  Routine eye exams. Ask your health care provider how often you should have your eyes checked.  Personal lifestyle choices, including:  Daily care of your teeth and gums.  Regular  physical activity.  Eating a healthy diet.  Avoiding tobacco and drug use.  Limiting alcohol use.  Practicing safe sex.  Taking low doses of aspirin every day.  Taking vitamin and mineral supplements as recommended by your health care provider. What happens during an annual well check? The services and screenings done by your health care provider during your annual well check will depend on your age, overall health, lifestyle risk factors, and family history of disease. Counseling  Your health care provider may ask you questions about your:  Alcohol use.  Tobacco use.  Drug use.  Emotional well-being.  Home and relationship well-being.  Sexual activity.  Eating habits.  History of falls.  Memory and ability to understand (cognition).  Work and work Astronomer. Screening  You may have the following tests or measurements:  Height, weight, and BMI.  Blood pressure.  Lipid and cholesterol levels. These may be checked every 5 years, or more frequently if you are over 52 years old.  Skin check.  Lung cancer screening. You may have this screening every year starting at age 78 if you have a 30-pack-year history of smoking and currently smoke or have quit within the past 15 years.  Fecal occult blood test (FOBT) of the stool. You may have this test every year starting at age 66.  Flexible sigmoidoscopy or colonoscopy. You may have a sigmoidoscopy every 5 years or a colonoscopy every 10 years starting at age 38.  Prostate cancer screening. Recommendations will vary depending on your family history and other risks.  Hepatitis C blood test.  Hepatitis B blood test.  Sexually transmitted disease (STD) testing.  Diabetes screening. This is done by checking your blood sugar (  glucose) after you have not eaten for a while (fasting). You may have this done every 1-3 years.  Abdominal aortic aneurysm (AAA) screening. You may need this if you are a current or former  smoker.  Osteoporosis. You may be screened starting at age 58 if you are at high risk. Talk with your health care provider about your test results, treatment options, and if necessary, the need for more tests. Vaccines  Your health care provider may recommend certain vaccines, such as:  Influenza vaccine. This is recommended every year.  Tetanus, diphtheria, and acellular pertussis (Tdap, Td) vaccine. You may need a Td booster every 10 years.  Zoster vaccine. You may need this after age 61.  Pneumococcal 13-valent conjugate (PCV13) vaccine. One dose is recommended after age 79.  Pneumococcal polysaccharide (PPSV23) vaccine. One dose is recommended after age 45. Talk to your health care provider about which screenings and vaccines you need and how often you need them. This information is not intended to replace advice given to you by your health care provider. Make sure you discuss any questions you have with your health care provider. Document Released: 08/28/2015 Document Revised: 04/20/2016 Document Reviewed: 06/02/2015 Elsevier Interactive Patient Education  2017 [place] Prevention in the Home Falls can cause injuries. They can happen to people of all ages. There are many things you can do to make your home safe and to help prevent falls. What can I do on the outside of my home?  Regularly fix the edges of walkways and driveways and fix any cracks.  Remove anything that might make you trip as you walk through a door, such as a raised step or threshold.  Trim any bushes or trees on the path to your home.  Use bright outdoor lighting.  Clear any walking paths of anything that might make someone trip, such as rocks or tools.  Regularly check to see if handrails are loose or broken. Make sure that both sides of any steps have handrails.  Any raised decks and porches should have guardrails on the edges.  Have any leaves, snow, or ice cleared regularly.  Use sand or  salt on walking paths during winter.  Clean up any spills in your garage right away. This includes oil or grease spills. What can I do in the bathroom?  Use night lights.  Install grab bars by the toilet and in the tub and shower. Do not use towel bars as grab bars.  Use non-skid mats or decals in the tub or shower.  If you need to sit down in the shower, use a plastic, non-slip stool.  Keep the floor dry. Clean up any water that spills on the floor as soon as it happens.  Remove soap buildup in the tub or shower regularly.  Attach bath mats securely with double-sided non-slip rug tape.  Do not have throw rugs and other things on the floor that can make you trip. What can I do in the bedroom?  Use night lights.  Make sure that you have a light by your bed that is easy to reach.  Do not use any sheets or blankets that are too big for your bed. They should not hang down onto the floor.  Have a firm chair that has side arms. You can use this for support while you get dressed.  Do not have throw rugs and other things on the floor that can make you trip. What can I do in the kitchen?  Clean up any spills right away.  Avoid walking on wet floors.  Keep items that you use a lot in easy-to-reach places.  If you need to reach something above you, use a strong step stool that has a grab bar.  Keep electrical cords out of the way.  Do not use floor polish or wax that makes floors slippery. If you must use wax, use non-skid floor wax.  Do not have throw rugs and other things on the floor that can make you trip. What can I do with my stairs?  Do not leave any items on the stairs.  Make sure that there are handrails on both sides of the stairs and use them. Fix handrails that are broken or loose. Make sure that handrails are as long as the stairways.  Check any carpeting to make sure that it is firmly attached to the stairs. Fix any carpet that is loose or worn.  Avoid having  throw rugs at the top or bottom of the stairs. If you do have throw rugs, attach them to the floor with carpet tape.  Make sure that you have a light switch at the top of the stairs and the bottom of the stairs. If you do not have them, ask someone to add them for you. What else can I do to help prevent falls?  Wear shoes that:  Do not have high heels.  Have rubber bottoms.  Are comfortable and fit you well.  Are closed at the toe. Do not wear sandals.  If you use a stepladder:  Make sure that it is fully opened. Do not climb a closed stepladder.  Make sure that both sides of the stepladder are locked into place.  Ask someone to hold it for you, if possible.  Clearly mark and make sure that you can see:  Any grab bars or handrails.  First and last steps.  Where the edge of each step is.  Use tools that help you move around (mobility aids) if they are needed. These include:  Canes.  Walkers.  Scooters.  Crutches.  Turn on the lights when you go into a dark area. Replace any light bulbs as soon as they burn out.  Set up your furniture so you have a clear path. Avoid moving your furniture around.  If any of your floors are uneven, fix them.  If there are any pets around you, be aware of where they are.  Review your medicines with your doctor. Some medicines can make you feel dizzy. This can increase your chance of falling. Ask your doctor what other things that you can do to help prevent falls. This information is not intended to replace advice given to you by your health care provider. Make sure you discuss any questions you have with your health care provider. Document Released: 05/28/2009 Document Revised: 01/07/2016 Document Reviewed: 09/05/2014 Elsevier Interactive Patient Education  2017 [employer].

## 2020-12-21 ENCOUNTER — Other Ambulatory Visit: Payer: Self-pay

## 2020-12-21 ENCOUNTER — Ambulatory Visit (INDEPENDENT_AMBULATORY_CARE_PROVIDER_SITE_OTHER): Payer: Medicare Other | Admitting: Family Medicine

## 2020-12-21 ENCOUNTER — Encounter: Payer: Self-pay | Admitting: Family Medicine

## 2020-12-21 VITALS — BP 149/81 | HR 81 | Temp 97.9°F | Ht 72.0 in | Wt 180.8 lb

## 2020-12-21 DIAGNOSIS — E118 Type 2 diabetes mellitus with unspecified complications: Secondary | ICD-10-CM | POA: Diagnosis not present

## 2020-12-21 DIAGNOSIS — I152 Hypertension secondary to endocrine disorders: Secondary | ICD-10-CM | POA: Diagnosis not present

## 2020-12-21 DIAGNOSIS — E785 Hyperlipidemia, unspecified: Secondary | ICD-10-CM | POA: Diagnosis not present

## 2020-12-21 DIAGNOSIS — E1159 Type 2 diabetes mellitus with other circulatory complications: Secondary | ICD-10-CM | POA: Diagnosis not present

## 2020-12-21 DIAGNOSIS — E1169 Type 2 diabetes mellitus with other specified complication: Secondary | ICD-10-CM

## 2020-12-21 LAB — BAYER DCA HB A1C WAIVED: HB A1C (BAYER DCA - WAIVED): 5.9 % (ref <7.0)

## 2020-12-21 NOTE — Progress Notes (Signed)
Subjective: CC: DM PCP: Raliegh Ip, DO Philip Ford is a 75 y.o. male presenting to clinic today for:  1. Type 2 Diabetes with hypertension, hyperlipidemia:  Metformin was reduced to 1 tablet daily given excellent control of diabetes.  He admits he occasionally indulges in sweets.  Most recently he had a cupcake at a bridal shower.  However, he does try to watch what he eats overall.  No polydipsia, polyuria, chest pain, edema, visual disturbance.  He has not taken his blood pressure medications yet but blood pressures typically are normal  Last eye exam: needs Last foot exam: UTD Last A1c:  Lab Results  Component Value Date   HGBA1C 5.5 09/22/2020   Nephropathy screen indicated?: UTD Last flu, zoster and/or pneumovax:  Immunization History  Administered Date(s) Administered  . Fluad Quad(high Dose 65+) 05/30/2019  . Moderna SARS-COV2 Booster Vaccination 10/21/2020  . Moderna Sars-Covid-2 Vaccination 10/09/2019, 11/06/2019  . Tdap 04/15/2010, 09/27/2013    ROS: Per HPI  Allergies  Allergen Reactions  . Statins     Myopathy    Past Medical History:  Diagnosis Date  . Coronary artery disease    BMS to OM1 1999, DES to mid LAD 06/2015  . Essential hypertension   . GERD (gastroesophageal reflux disease)   . History of Helicobacter pylori infection   . History of skin cancer   . Hyperlipidemia   . Statin intolerance   . Type II diabetes mellitus (HCC)   . Vitamin D deficiency     Current Outpatient Medications:  .  Ascorbic Acid (VITAMIN C) 1000 MG tablet, Take 1,000 mg by mouth daily., Disp: , Rfl:  .  aspirin EC 81 MG tablet, Take 81 mg by mouth daily., Disp: , Rfl:  .  clopidogrel (PLAVIX) 75 MG tablet, TAKE 1 TABLET BY MOUTH  DAILY, Disp: 90 tablet, Rfl: 0 .  ezetimibe (ZETIA) 10 MG tablet, Take 1 tablet (10 mg total) by mouth daily., Disp: 90 tablet, Rfl: 3 .  gemfibrozil (LOPID) 600 MG tablet, Take 1 tablet (600 mg total) by mouth 2 (two) times  daily before a meal., Disp: 180 tablet, Rfl: 0 .  isosorbide mononitrate (IMDUR) 30 MG 24 hr tablet, TAKE 1 TABLET BY MOUTH  DAILY, Disp: 90 tablet, Rfl: 3 .  lisinopril (ZESTRIL) 10 MG tablet, Take 1 tablet (10 mg total) by mouth daily., Disp: 90 tablet, Rfl: 3 .  metFORMIN (GLUCOPHAGE XR) 500 MG 24 hr tablet, Take 1 tablet (500 mg total) by mouth daily with breakfast., Disp: 90 tablet, Rfl: 3 .  metoprolol succinate (TOPROL XL) 25 MG 24 hr tablet, Take 1 tablet (25 mg total) by mouth daily., Disp: 90 tablet, Rfl: 2 .  Multiple Vitamins-Minerals (CENTRUM SILVER PO), Take 1 tablet by mouth daily. , Disp: , Rfl:  .  nitroGLYCERIN (NITROSTAT) 0.4 MG SL tablet, DISSOLVE 1 TABLET UNDER THE TONGUE EVERY 5 MINUTES AS NEEDED FOR CHEST PAIN. DO NOT EXCEED A TOTAL OF 3 DOSES IN 15 MINUTES., Disp: 25 tablet, Rfl: 3 .  Omega-3 Fatty Acids (FISH OIL OMEGA-3 PO), 2 (two) times daily. Take one tab by mouth daily - 2000mg , Disp: , Rfl:  .  pantoprazole (PROTONIX) 40 MG tablet, TAKE 1 TABLET BY MOUTH  TWICE DAILY, Disp: 180 tablet, Rfl: 0 .  tamsulosin (FLOMAX) 0.4 MG CAPS capsule, Take 1 capsule (0.4 mg total) by mouth daily., Disp: 30 capsule, Rfl: 3 Social History   Socioeconomic History  . Marital status: Married  Spouse name: Philip Ford  . Number of children: 0  . Years of education: 84  . Highest education level: 12th grade  Occupational History  . Not on file  Tobacco Use  . Smoking status: Current Every Day Smoker    Packs/day: 0.50    Years: 65.00    Pack years: 32.50    Types: Cigarettes  . Smokeless tobacco: Former Neurosurgeon    Types: Chew  . Tobacco comment: "stopped chewing in the 1970's"  Vaping Use  . Vaping Use: Never used  Substance and Sexual Activity  . Alcohol use: Yes    Alcohol/week: 6.0 standard drinks    Types: 6 Cans of beer per week  . Drug use: No  . Sexual activity: Not Currently  Other Topics Concern  . Not on file  Social History Narrative   Lives with his wife, enjoys  gardening and outdoor work, stays busy.   Social Determinants of Health   Financial Resource Strain: Low Risk   . Difficulty of Paying Living Expenses: Not hard at all  Food Insecurity: No Food Insecurity  . Worried About Programme researcher, broadcasting/film/video in the Last Year: Never true  . Ran Out of Food in the Last Year: Never true  Transportation Needs: No Transportation Needs  . Lack of Transportation (Medical): No  . Lack of Transportation (Non-Medical): No  Physical Activity: Insufficiently Active  . Days of Exercise per Week: 7 days  . Minutes of Exercise per Session: 10 min  Stress: No Stress Concern Present  . Feeling of Stress : Not at all  Social Connections: Moderately Isolated  . Frequency of Communication with Friends and Family: More than three times a week  . Frequency of Social Gatherings with Friends and Family: Once a week  . Attends Religious Services: Never  . Active Member of Clubs or Organizations: No  . Attends Banker Meetings: Never  . Marital Status: Married  Catering manager Violence: Not At Risk  . Fear of Current or Ex-Partner: No  . Emotionally Abused: No  . Physically Abused: No  . Sexually Abused: No   Family History  Problem Relation Age of Onset  . Heart attack Father   . Heart attack Paternal Aunt   . Heart attack Paternal Uncle   . Healthy Mother     Objective: Office vital signs reviewed. BP (!) 149/81   Pulse 81   Temp 97.9 F (36.6 C)   Ht 6' (1.829 m)   Wt 180 lb 12.8 oz (82 kg)   SpO2 100%   BMI 24.52 kg/m   Physical Examination:  General: Awake, alert, well nourished, No acute distress HEENT: Normal; no carotid bruits Cardio: regular rate and rhythm, S1S2 heard, no murmurs appreciated Pulm: clear to auscultation bilaterally, no wheezes, rhonchi or rales; normal work of breathing on room air  Assessment/ Plan: 74 y.o. male   Controlled type 2 diabetes mellitus with complication, without long-term current use of insulin  (HCC) - Plan: Bayer DCA Hb A1c Waived  Hyperlipidemia associated with type 2 diabetes mellitus (HCC) - Plan: Lipid Panel  Hypertension associated with diabetes (HCC)  Sugar under excellent control despite reduction in metformin.  His A1c is at 5.9.  I think that this is low enough that we can discontinue his metformin and recheck in the next 3 to 4 months.  As long as he remains below 7.0 I do not see a reason to restart medication.  He will continue to monitor his blood  pressure closely and contact me if blood sugars are elevated.  Continue cholesterol medications.  Has not yet taken blood pressure medication but will take at home.  He is having normal blood pressures at home and had almost low blood pressure at our last visit so no need to escalate therapy   No orders of the defined types were placed in this encounter.  No orders of the defined types were placed in this encounter.  Medications Discontinued During This Encounter  Medication Reason  . metFORMIN (GLUCOPHAGE XR) 500 MG 24 hr tablet Discontinued by provider      Raliegh Ip, DO [facility] [phone]

## 2020-12-23 DIAGNOSIS — Z4682 Encounter for fitting and adjustment of non-vascular catheter: Secondary | ICD-10-CM | POA: Diagnosis not present

## 2020-12-23 DIAGNOSIS — I213 ST elevation (STEMI) myocardial infarction of unspecified site: Secondary | ICD-10-CM | POA: Diagnosis not present

## 2020-12-23 DIAGNOSIS — Z66 Do not resuscitate: Secondary | ICD-10-CM | POA: Diagnosis not present

## 2020-12-23 DIAGNOSIS — R402212 Coma scale, best verbal response, none, at arrival to emergency department: Secondary | ICD-10-CM | POA: Diagnosis not present

## 2020-12-23 DIAGNOSIS — Z20822 Contact with and (suspected) exposure to covid-19: Secondary | ICD-10-CM | POA: Diagnosis not present

## 2020-12-23 DIAGNOSIS — G911 Obstructive hydrocephalus: Secondary | ICD-10-CM | POA: Diagnosis not present

## 2020-12-23 DIAGNOSIS — R402112 Coma scale, eyes open, never, at arrival to emergency department: Secondary | ICD-10-CM | POA: Diagnosis not present

## 2020-12-23 DIAGNOSIS — J9601 Acute respiratory failure with hypoxia: Secondary | ICD-10-CM | POA: Diagnosis not present

## 2020-12-23 DIAGNOSIS — G253 Myoclonus: Secondary | ICD-10-CM | POA: Diagnosis not present

## 2020-12-23 DIAGNOSIS — R570 Cardiogenic shock: Secondary | ICD-10-CM | POA: Diagnosis not present

## 2020-12-23 DIAGNOSIS — I517 Cardiomegaly: Secondary | ICD-10-CM | POA: Diagnosis not present

## 2020-12-23 DIAGNOSIS — G935 Compression of brain: Secondary | ICD-10-CM | POA: Diagnosis not present

## 2020-12-23 DIAGNOSIS — I700 Atherosclerosis of aorta: Secondary | ICD-10-CM | POA: Diagnosis not present

## 2020-12-23 DIAGNOSIS — I2510 Atherosclerotic heart disease of native coronary artery without angina pectoris: Secondary | ICD-10-CM | POA: Diagnosis not present

## 2020-12-23 DIAGNOSIS — J9811 Atelectasis: Secondary | ICD-10-CM | POA: Diagnosis not present

## 2020-12-23 DIAGNOSIS — R4020 Unspecified coma: Secondary | ICD-10-CM | POA: Diagnosis not present

## 2020-12-23 DIAGNOSIS — J9602 Acute respiratory failure with hypercapnia: Secondary | ICD-10-CM | POA: Diagnosis not present

## 2020-12-23 DIAGNOSIS — I10 Essential (primary) hypertension: Secondary | ICD-10-CM | POA: Diagnosis not present

## 2020-12-23 DIAGNOSIS — E119 Type 2 diabetes mellitus without complications: Secondary | ICD-10-CM | POA: Diagnosis not present

## 2020-12-23 DIAGNOSIS — M9689 Other intraoperative and postprocedural complications and disorders of the musculoskeletal system: Secondary | ICD-10-CM | POA: Diagnosis not present

## 2020-12-23 DIAGNOSIS — J80 Acute respiratory distress syndrome: Secondary | ICD-10-CM | POA: Diagnosis not present

## 2020-12-23 DIAGNOSIS — I452 Bifascicular block: Secondary | ICD-10-CM | POA: Diagnosis not present

## 2020-12-23 DIAGNOSIS — N179 Acute kidney failure, unspecified: Secondary | ICD-10-CM | POA: Diagnosis not present

## 2020-12-23 DIAGNOSIS — G9389 Other specified disorders of brain: Secondary | ICD-10-CM | POA: Diagnosis not present

## 2020-12-23 DIAGNOSIS — G9340 Encephalopathy, unspecified: Secondary | ICD-10-CM | POA: Diagnosis not present

## 2020-12-23 DIAGNOSIS — J811 Chronic pulmonary edema: Secondary | ICD-10-CM | POA: Diagnosis not present

## 2020-12-23 DIAGNOSIS — G931 Anoxic brain damage, not elsewhere classified: Secondary | ICD-10-CM | POA: Diagnosis not present

## 2020-12-23 DIAGNOSIS — K7200 Acute and subacute hepatic failure without coma: Secondary | ICD-10-CM | POA: Diagnosis not present

## 2020-12-23 DIAGNOSIS — J942 Hemothorax: Secondary | ICD-10-CM | POA: Diagnosis not present

## 2020-12-23 DIAGNOSIS — I469 Cardiac arrest, cause unspecified: Secondary | ICD-10-CM | POA: Diagnosis not present

## 2020-12-23 DIAGNOSIS — Z95818 Presence of other cardiac implants and grafts: Secondary | ICD-10-CM | POA: Diagnosis not present

## 2020-12-23 DIAGNOSIS — I214 Non-ST elevation (NSTEMI) myocardial infarction: Secondary | ICD-10-CM | POA: Diagnosis not present

## 2020-12-23 DIAGNOSIS — J939 Pneumothorax, unspecified: Secondary | ICD-10-CM | POA: Diagnosis not present

## 2020-12-23 DIAGNOSIS — J9622 Acute and chronic respiratory failure with hypercapnia: Secondary | ICD-10-CM | POA: Diagnosis not present

## 2020-12-23 DIAGNOSIS — Z978 Presence of other specified devices: Secondary | ICD-10-CM | POA: Diagnosis not present

## 2020-12-23 DIAGNOSIS — R402312 Coma scale, best motor response, none, at arrival to emergency department: Secondary | ICD-10-CM | POA: Diagnosis not present

## 2020-12-23 DIAGNOSIS — Z4659 Encounter for fitting and adjustment of other gastrointestinal appliance and device: Secondary | ICD-10-CM | POA: Diagnosis not present

## 2020-12-23 DIAGNOSIS — E872 Acidosis: Secondary | ICD-10-CM | POA: Diagnosis not present

## 2020-12-23 DIAGNOSIS — F17210 Nicotine dependence, cigarettes, uncomplicated: Secondary | ICD-10-CM | POA: Diagnosis not present

## 2020-12-23 DIAGNOSIS — J9621 Acute and chronic respiratory failure with hypoxia: Secondary | ICD-10-CM | POA: Diagnosis not present

## 2020-12-23 DIAGNOSIS — N170 Acute kidney failure with tubular necrosis: Secondary | ICD-10-CM | POA: Diagnosis not present

## 2020-12-23 DIAGNOSIS — S272XXA Traumatic hemopneumothorax, initial encounter: Secondary | ICD-10-CM | POA: Diagnosis not present

## 2020-12-23 DIAGNOSIS — R079 Chest pain, unspecified: Secondary | ICD-10-CM | POA: Diagnosis not present

## 2020-12-23 DIAGNOSIS — I249 Acute ischemic heart disease, unspecified: Secondary | ICD-10-CM | POA: Diagnosis not present

## 2020-12-23 DIAGNOSIS — I739 Peripheral vascular disease, unspecified: Secondary | ICD-10-CM | POA: Diagnosis not present

## 2020-12-23 LAB — LIPID PANEL
Chol/HDL Ratio: 5.3 ratio — ABNORMAL HIGH (ref 0.0–5.0)
Cholesterol, Total: 168 mg/dL (ref 100–199)
HDL: 32 mg/dL — ABNORMAL LOW (ref >39)
LDL Chol Calc (NIH): 114 mg/dL — ABNORMAL HIGH (ref 0–99)
Triglycerides: 120 mg/dL (ref 0–149)
VLDL Cholesterol Cal: 22 mg/dL (ref 5–40)

## 2020-12-27 ENCOUNTER — Other Ambulatory Visit: Payer: Self-pay | Admitting: Family Medicine

## 2020-12-27 DIAGNOSIS — K219 Gastro-esophageal reflux disease without esophagitis: Secondary | ICD-10-CM

## 2021-01-13 ENCOUNTER — Ambulatory Visit: Payer: Medicare Other | Admitting: Cardiology

## 2021-01-13 DEATH — deceased

## 2021-01-19 ENCOUNTER — Other Ambulatory Visit: Payer: Self-pay | Admitting: Cardiology

## 2021-04-26 ENCOUNTER — Ambulatory Visit: Payer: Medicare Other | Admitting: Family Medicine

## 2021-12-08 ENCOUNTER — Ambulatory Visit: Payer: Medicare Other
